# Patient Record
Sex: Female | Born: 1990 | Race: Black or African American | Hispanic: No | Marital: Single | Smoking: Never smoker
Health system: Southern US, Community
[De-identification: ages and names within clinical notes are randomized; demographics above are authoritative.]

## PROBLEM LIST (undated history)

## (undated) ENCOUNTER — Inpatient Hospital Stay (HOSPITAL_COMMUNITY): Payer: Self-pay

## (undated) DIAGNOSIS — R160 Hepatomegaly, not elsewhere classified: Secondary | ICD-10-CM

## (undated) DIAGNOSIS — Z789 Other specified health status: Secondary | ICD-10-CM

## (undated) HISTORY — PX: NO PAST SURGERIES: SHX2092

## (undated) HISTORY — DX: Hepatomegaly, not elsewhere classified: R16.0

## (undated) HISTORY — DX: Other specified health status: Z78.9

---
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
## 2012-01-01 ENCOUNTER — Ambulatory Visit: Payer: Medicaid Other

## 2012-01-03 ENCOUNTER — Ambulatory Visit (INDEPENDENT_AMBULATORY_CARE_PROVIDER_SITE_OTHER): Payer: Medicaid Other | Admitting: *Deleted

## 2012-01-03 DIAGNOSIS — Z309 Encounter for contraceptive management, unspecified: Secondary | ICD-10-CM

## 2012-01-03 MED ORDER — MEDROXYPROGESTERONE ACETATE 150 MG/ML IM SUSP
150.0000 mg | Freq: Once | INTRAMUSCULAR | Status: AC
  Administered 2012-01-03: 150 mg via INTRAMUSCULAR

## 2012-01-03 NOTE — Progress Notes (Signed)
Next Depo due Dec 11 thru Apr 03, 2012.

## 2013-04-10 NOTE — L&D Delivery Note (Signed)
Delivery Note At 12:23 PM a viable female was delivered via  (Presentation: LOA).  APGAR: 9,9; weight - pending.  Placenta status: Delivered Intact, spontaneous. Cord: 3 vessel with the following complications: None.  Cord pH: N/A.   Anesthesia:  Epidural Episiotomy:  None Lacerations:  Small 1st degree tears - periurethral and labial.  Suture Repair: Hemostatic and did not need repair. Est. Blood Loss (mL):  250 mL  Mother progressed quickly and delivered over intact perineum.  Placenta delivered quickly afterward with minimal bleeding. Mother stable and baby doing well.   Mom to postpartum.  Baby to Couplet care / Skin to Skin.  Everlene OtherJayce Cook DO Family Medicine PGY-3 Pager #: [phone]  I was present for the delivery and agree with above.  [place] Zylen Wenig, CNM 02/26/2014 4:09 PM

## 2013-07-18 ENCOUNTER — Inpatient Hospital Stay (HOSPITAL_COMMUNITY)
Admission: AD | Admit: 2013-07-18 | Discharge: 2013-07-18 | Disposition: A | Discharge status: Home / Self Care | Payer: Medicaid Other | Source: Ambulatory Visit | Attending: Family Medicine | Admitting: Family Medicine

## 2013-07-18 ENCOUNTER — Inpatient Hospital Stay (HOSPITAL_COMMUNITY): Payer: Medicaid Other

## 2013-07-18 ENCOUNTER — Encounter (HOSPITAL_COMMUNITY): Payer: Self-pay

## 2013-07-18 DIAGNOSIS — O209 Hemorrhage in early pregnancy, unspecified: Secondary | ICD-10-CM

## 2013-07-18 LAB — WET PREP, GENITAL
CLUE CELLS WET PREP: NONE SEEN
TRICH WET PREP: NONE SEEN
Yeast Wet Prep HPF POC: NONE SEEN

## 2013-07-18 LAB — ABO/RH: ABO/RH(D): A POS

## 2013-07-18 LAB — POCT PREGNANCY, URINE: PREG TEST UR: POSITIVE — AB

## 2013-07-18 LAB — CBC
HEMATOCRIT: 35.4 % — AB (ref 36.0–46.0)
Hemoglobin: 12.5 g/dL (ref 12.0–15.0)
MCH: 29.5 pg (ref 26.0–34.0)
MCHC: 35.3 g/dL (ref 30.0–36.0)
MCV: 83.5 fL (ref 78.0–100.0)
PLATELETS: 320 10*3/uL (ref 150–400)
RBC: 4.24 MIL/uL (ref 3.87–5.11)
RDW: 13.5 % (ref 11.5–15.5)
WBC: 11.4 10*3/uL — AB (ref 4.0–10.5)

## 2013-07-18 LAB — URINALYSIS, ROUTINE W REFLEX MICROSCOPIC
Bilirubin Urine: NEGATIVE
Glucose, UA: NEGATIVE mg/dL
Ketones, ur: NEGATIVE mg/dL
Leukocytes, UA: NEGATIVE
Nitrite: NEGATIVE
Protein, ur: NEGATIVE mg/dL
Specific Gravity, Urine: 1.02 (ref 1.005–1.030)
UROBILINOGEN UA: 0.2 mg/dL (ref 0.0–1.0)
pH: 7.5 (ref 5.0–8.0)

## 2013-07-18 LAB — URINE MICROSCOPIC-ADD ON

## 2013-07-18 LAB — HCG, QUANTITATIVE, PREGNANCY: hCG, Beta Chain, Quant, S: 58766 m[IU]/mL — ABNORMAL HIGH (ref <5)

## 2013-07-18 LAB — HIV ANTIBODY (ROUTINE TESTING W REFLEX): HIV: NONREACTIVE

## 2013-07-18 NOTE — Discharge Instructions (Signed)
Vaginal Bleeding During Pregnancy, First Trimester A small amount of bleeding (spotting) from the vagina is relatively common in early pregnancy. It usually stops on its own. Various things may cause bleeding or spotting in early pregnancy. Some bleeding may be related to the pregnancy, and some may not. In most cases, the bleeding is normal and is not a problem. However, bleeding can also be a sign of something serious. Be sure to tell your health care provider about any vaginal bleeding right away. Some possible causes of vaginal bleeding during the first trimester include:  Infection or inflammation of the cervix.  Growths (polyps) on the cervix.  Miscarriage or threatened miscarriage.  Pregnancy tissue has developed outside of the uterus and in a fallopian tube (tubal pregnancy).  Tiny cysts have developed in the uterus instead of pregnancy tissue (molar pregnancy). HOME CARE INSTRUCTIONS  Watch your condition for any changes. The following actions may help to lessen any discomfort you are feeling:  Follow your health care provider's instructions for limiting your activity. If your health care provider orders bed rest, you may need to stay in bed and only get up to use the bathroom. However, your health care provider may allow you to continue light activity.  If needed, make plans for someone to help with your regular activities and responsibilities while you are on bed rest.  Keep track of the number of pads you use each day, how often you change pads, and how soaked (saturated) they are. Write this down.  Do not use tampons. Do not douche.  Do not have sexual intercourse or orgasms until approved by your health care provider.  If you pass any tissue from your vagina, save the tissue so you can show it to your health care provider.  Only take over-the-counter or prescription medicines as directed by your health care provider.  Do not take aspirin because it can make you  bleed.  Keep all follow-up appointments as directed by your health care provider. SEEK MEDICAL CARE IF:  You have any vaginal bleeding during any part of your pregnancy.  You have cramps or labor pains. SEEK IMMEDIATE MEDICAL CARE IF:   You have severe cramps in your back or belly (abdomen).  You have a fever, not controlled by medicine.  You pass large clots or tissue from your vagina.  Your bleeding increases.  You feel lightheaded or weak, or you have fainting episodes.  You have chills.  You are leaking fluid or have a gush of fluid from your vagina.  You pass out while having a bowel movement. MAKE SURE YOU:  Understand these instructions.  Will watch your condition.  Will get help right away if you are not doing well or get worse. Document Released: 01/04/2005 Document Revised: 01/15/2013 Document Reviewed: 12/02/2012 [facility] Patient Information 2014 [place].  Pelvic Rest Pelvic rest is sometimes recommended for women when:   The placenta is partially or completely covering the opening of the cervix (placenta previa).  There is bleeding between the uterine wall and the amniotic sac in the first trimester (subchorionic hemorrhage).  The cervix begins to open without labor starting (incompetent cervix, cervical insufficiency).  The labor is too early (preterm labor). HOME CARE INSTRUCTIONS  Do not have sexual intercourse, stimulation, or an orgasm.  Do not use tampons, douche, or put anything in the vagina.  Do not lift anything over 10 pounds (4.5 kg).  Avoid strenuous activity or straining your pelvic muscles. SEEK MEDICAL CARE IF:  You have any vaginal bleeding during pregnancy. Treat this as a potential emergency. °· You have cramping pain felt low in the stomach (stronger than menstrual cramps). °· You notice vaginal discharge (watery, mucus, or bloody). °· You have a low, dull backache. °· There are regular contractions or uterine  tightening. °SEEK IMMEDIATE MEDICAL CARE IF: °You have vaginal bleeding and have placenta previa.  °Document Released: 07/22/2010 Document Revised: 06/19/2011 Document Reviewed: 07/22/2010 °ExitCare® Patient Information ©2014 [employer]. ° °

## 2013-07-18 NOTE — MAU Provider Note (Signed)
History     CSN: [id number]  Arrival date and time: 07/18/13 0414   First Provider Initiated Contact with Patient 07/18/13 0447      Chief Complaint  Patient presents with  . Vaginal Bleeding   HPI Comments: April [name] 22 y.o. G1P0 [redacted]w[redacted]d presents to MAU with blown bleeding from vagina that started tonight. She denies any pain. Last intercourse was several weeks ago.   Vaginal Bleeding      History reviewed. No pertinent past medical history.  History reviewed. No pertinent past surgical history.  Family History  Problem Relation Age of Onset  . Diabetes Maternal Aunt   . Diabetes Maternal Uncle   . Hypertension Maternal Uncle   . Diabetes Maternal Grandmother   . Hypertension Maternal Grandmother   . Cancer Maternal Grandfather     History  Substance Use Topics  . Smoking status: Never Smoker   . Smokeless tobacco: Not on file  . Alcohol Use: No    Allergies:  Allergies  Allergen Reactions  . Eggs Or Egg-Derived Products Hives and Swelling     Mother and patient state swelling of throat, itching of throat, hives.    Facility-administered medications prior to admission  Medication Dose Route Frequency Provider Last Rate Last Dose  . meningococcal polysaccharide (MENACTRA) injection 0.5 mL  0.5 mL Intramuscular Once Durwin Reges, MD       Prescriptions prior to admission  Medication Sig Dispense Refill  . Prenatal Vit-Fe Fumarate-FA (MULTIVITAMIN-PRENATAL) 27-0.8 MG TABS tablet Take 1 tablet by mouth daily at 12 noon.      . medroxyPROGESTERone (DEPO-PROVERA) 150 MG/ML injection Inject 1 mL (150 mg total) into the muscle every 3 (three) months.  1 mL  0    Review of Systems  Constitutional: Negative.   HENT: Negative.   Eyes: Negative.   Respiratory: Negative.   Cardiovascular: Negative.   Gastrointestinal: Negative.   Genitourinary: Positive for vaginal bleeding.       Vaginal bleeding  Musculoskeletal: Negative.   Skin: Negative.    Neurological: Negative.   Psychiatric/Behavioral: Negative.    Physical Exam   Blood pressure 123/61, pulse 98, temperature 98.8 F (37.1 C), temperature source Oral, resp. rate 18, height 5' (1.524 m), weight 166 lb 6.4 oz (75.479 kg), last menstrual period 05/21/2013.  Physical Exam  Constitutional: She is oriented to person, place, and time. She appears well-developed and well-nourished. No distress.  HENT:  Head: Normocephalic and atraumatic.  Eyes: Pupils are equal, round, and reactive to light.  GI: Soft. Bowel sounds are normal. She exhibits no distension and no mass. There is no tenderness. There is no rebound and no guarding.  Genitourinary:  Genital:External negative Vaginal:small amount brown blood Cervix:closed/ thick Bimanual:nonteneder   Musculoskeletal: Normal range of motion.  Neurological: She is alert and oriented to person, place, and time.  Skin: Skin is warm and dry.  Psychiatric: She has a normal mood and affect. Her behavior is normal. Judgment and thought content normal.   Results for orders placed during the hospital encounter of 07/18/13 (from the past 24 hour(s))  URINALYSIS, ROUTINE W REFLEX MICROSCOPIC     Status: Abnormal   Collection Time    07/18/13  4:22 AM      Result Value Ref Range   Color, Urine YELLOW  YELLOW   APPearance CLEAR  CLEAR   Specific Gravity, Urine 1.020  1.005 - 1.030   pH 7.5  5.0 - 8.0   Glucose, UA NEGATIVE  NEGATIVE mg/dL   Hgb urine dipstick MODERATE (*) NEGATIVE   Bilirubin Urine NEGATIVE  NEGATIVE   Ketones, ur NEGATIVE  NEGATIVE mg/dL   Protein, ur NEGATIVE  NEGATIVE mg/dL   Urobilinogen, UA 0.2  0.0 - 1.0 mg/dL   Nitrite NEGATIVE  NEGATIVE   Leukocytes, UA NEGATIVE  NEGATIVE  URINE MICROSCOPIC-ADD ON     Status: Abnormal   Collection Time    07/18/13  4:22 AM      Result Value Ref Range   Squamous Epithelial / LPF FEW (*) RARE   WBC, UA 0-2  <3 WBC/hpf   RBC / HPF 0-2  <3 RBC/hpf   Bacteria, UA FEW (*) RARE    Urine-Other MUCOUS PRESENT    POCT PREGNANCY, URINE     Status: Abnormal   Collection Time    07/18/13  4:29 AM      Result Value Ref Range   Preg Test, Ur POSITIVE (*) NEGATIVE  WET PREP, GENITAL     Status: Abnormal   Collection Time    07/18/13  5:09 AM      Result Value Ref Range   Yeast Wet Prep HPF POC NONE SEEN  NONE SEEN   Trich, Wet Prep NONE SEEN  NONE SEEN   Clue Cells Wet Prep HPF POC NONE SEEN  NONE SEEN   WBC, Wet Prep HPF POC FEW (*) NONE SEEN  ABO/RH     Status: None   Collection Time    07/18/13  5:17 AM      Result Value Ref Range   ABO/RH(D) A POS    CBC     Status: Abnormal   Collection Time    07/18/13  5:18 AM      Result Value Ref Range   WBC 11.4 (*) 4.0 - 10.5 K/uL   RBC 4.24  3.87 - 5.11 MIL/uL   Hemoglobin 12.5  12.0 - 15.0 g/dL   HCT 16.1 (*) 09.6 - 04.5 %   MCV 83.5  78.0 - 100.0 fL   MCH 29.5  26.0 - 34.0 pg   MCHC 35.3  30.0 - 36.0 g/dL   RDW 40.9  81.1 - 91.4 %   Platelets 320  150 - 400 K/uL  HCG, QUANTITATIVE, PREGNANCY     Status: Abnormal   Collection Time    07/18/13  5:18 AM      Result Value Ref Range   hCG, Beta Chain, Quant, [place] [address] (*) <5 mIU/mL   US Ob Comp Less 14 Wks  07/18/2013   CLINICAL DATA:  Pregnancy with bleeding.  EXAM: OBSTETRIC <14 WK [place] AND TRANSVAGINAL OB US  TECHNIQUE: Both transabdominal and transvaginal ultrasound examinations were performed for complete evaluation of the gestation as well as the maternal uterus, adnexal regions, and pelvic cul-de-sac. Transvaginal technique was performed to assess early pregnancy.  COMPARISON:  None.  FINDINGS: Intrauterine gestational sac: Visualized/normal in shape.  Yolk sac:  Present  Embryo:  Present  Cardiac Activity: Present  Heart Rate:  125 bpm  CRL:   14.1  mm   7 w 5 d                  [place] EDC: 03/01/2014  Maternal uterus/adnexae: The ovaries are not visualized. No adnexal mass. There is a small volume of simple appearing free pelvic fluid. A small amount of subchorionic  hemorrhage is noted, measuring no more than 2 mm in thickness and 7 mm in length.  IMPRESSION: 1.  Single, living intrauterine gestation, estimated age 18 weeks 5 days. 2. Small subchorionic hemorrhage.   Electronically Signed   By: Tiburcio PeaJonathan  Watts M.D.   On: 07/18/2013 05:57   [place] Ob Transvaginal  07/18/2013   CLINICAL DATA:  Pregnancy with bleeding.  EXAM: OBSTETRIC <14 WK US AND TRANSVAGINAL OB US  TECHNIQUE: Both transabdominal and transvaginal ultrasound examinations were performed for complete evaluation of the gestation as well as the maternal uterus, adnexal regions, and pelvic cul-de-sac. Transvaginal technique was performed to assess early pregnancy.  COMPARISON:  None.  FINDINGS: Intrauterine gestational sac: Visualized/normal in shape.  Yolk sac:  Present  Embryo:  Present  Cardiac Activity: Present  Heart Rate:  125 bpm  CRL:   14.1  mm   7 w 5 d                  [place] EDC: 03/01/2014  Maternal uterus/adnexae: The ovaries are not visualized. No adnexal mass. There is a small volume of simple appearing free pelvic fluid. A small amount of subchorionic hemorrhage is noted, measuring no more than 2 mm in thickness and 7 mm in length.  IMPRESSION: 1. Single, living intrauterine gestation, estimated age 18 weeks 5 days. 2. Small subchorionic hemorrhage.   Electronically Signed   By: Tiburcio PeaJonathan  Watts M.D.   On: 07/18/2013 05:57    MAU Course  Procedures  MDM  Wet prep, GC, Chlamydia, CBC, UA, U/S, ABORh, Quant   Assessment and Plan   A: [facility]  P: Bleeding Precautions Start PNV daily Begin Prenatal care Return to MAU if needed  Delbert PhenixLinda M Julion Gatt 07/18/2013, 5:03 AM

## 2013-07-18 NOTE — MAU Provider Note (Signed)
Attestation of Attending Supervision of Advanced Practitioner (PA/CNM/NP): Evaluation and management procedures were performed by the Advanced Practitioner under my supervision and collaboration.  I have reviewed the Advanced Practitioner's note and chart, and I agree with the management and plan.  Sundiata Ferrick S Emilija Bohman, MD [facility] Attending 07/18/2013 7:17 AM   

## 2013-07-18 NOTE — MAU Note (Signed)
Pt states she has a brown discharge when wiping. Denies abdominal pain or cramping. States LMP is 05-21-2013. [facility]

## 2013-07-19 LAB — GC/CHLAMYDIA PROBE AMP
CT Probe RNA: NEGATIVE
GC PROBE AMP APTIMA: NEGATIVE

## 2013-08-04 ENCOUNTER — Other Ambulatory Visit: Payer: Medicaid Other

## 2013-08-04 DIAGNOSIS — Z331 Pregnant state, incidental: Secondary | ICD-10-CM

## 2013-08-04 LAB — OB RESULTS CONSOLE GC/CHLAMYDIA
Chlamydia: NEGATIVE
Gonorrhea: NEGATIVE

## 2013-08-04 NOTE — Progress Notes (Signed)
NEW OB LABS DONE TODAY April [name] 

## 2013-08-05 LAB — OBSTETRIC PANEL
ANTIBODY SCREEN: NEGATIVE
BASOS ABS: 0.1 10*3/uL (ref 0.0–0.1)
BASOS PCT: 1 % (ref 0–1)
EOS ABS: 0.5 10*3/uL (ref 0.0–0.7)
Eosinophils Relative: 5 % (ref 0–5)
HCT: 37.4 % (ref 36.0–46.0)
HEMOGLOBIN: 13 g/dL (ref 12.0–15.0)
Hepatitis B Surface Ag: NEGATIVE
Lymphocytes Relative: 43 % (ref 12–46)
Lymphs Abs: 4.3 10*3/uL — ABNORMAL HIGH (ref 0.7–4.0)
MCH: 29 pg (ref 26.0–34.0)
MCHC: 34.8 g/dL (ref 30.0–36.0)
MCV: 83.3 fL (ref 78.0–100.0)
MONOS PCT: 8 % (ref 3–12)
Monocytes Absolute: 0.8 10*3/uL (ref 0.1–1.0)
NEUTROS PCT: 43 % (ref 43–77)
Neutro Abs: 4.3 10*3/uL (ref 1.7–7.7)
Platelets: 353 10*3/uL (ref 150–400)
RBC: 4.49 MIL/uL (ref 3.87–5.11)
RDW: 14.3 % (ref 11.5–15.5)
Rh Type: POSITIVE
Rubella: 4.53 Index — ABNORMAL HIGH (ref <0.90)
WBC: 9.9 10*3/uL (ref 4.0–10.5)

## 2013-08-05 LAB — SICKLE CELL SCREEN: SICKLE CELL SCREEN: NEGATIVE

## 2013-08-05 LAB — CULTURE, OB URINE
COLONY COUNT: NO GROWTH
ORGANISM ID, BACTERIA: NO GROWTH

## 2013-08-05 LAB — HIV ANTIBODY (ROUTINE TESTING W REFLEX): HIV 1&2 Ab, 4th Generation: NONREACTIVE

## 2013-08-11 ENCOUNTER — Other Ambulatory Visit (HOSPITAL_COMMUNITY)
Admission: RE | Admit: 2013-08-11 | Discharge: 2013-08-11 | Disposition: A | Discharge status: Home / Self Care | Payer: Medicaid Other | Source: Ambulatory Visit | Attending: Family Medicine | Admitting: Family Medicine

## 2013-08-11 ENCOUNTER — Encounter: Payer: Self-pay | Admitting: Family Medicine

## 2013-08-11 ENCOUNTER — Ambulatory Visit (INDEPENDENT_AMBULATORY_CARE_PROVIDER_SITE_OTHER): Payer: Medicaid Other | Admitting: Family Medicine

## 2013-08-11 VITALS — BP 118/64 | HR 97 | Temp 98.2°F | Wt 167.0 lb

## 2013-08-11 DIAGNOSIS — Z113 Encounter for screening for infections with a predominantly sexual mode of transmission: Secondary | ICD-10-CM | POA: Insufficient documentation

## 2013-08-11 DIAGNOSIS — Z3400 Encounter for supervision of normal first pregnancy, unspecified trimester: Secondary | ICD-10-CM | POA: Insufficient documentation

## 2013-08-11 MED ORDER — PRENATAL 27-0.8 MG PO TABS: 1.0000 | ORAL_TABLET | Freq: Every day | ORAL | Status: DC

## 2013-08-11 NOTE — Patient Instructions (Signed)
It was nice to see you today.  Follow up in 4 weeks.   Pregnancy - First Trimester During sexual intercourse, millions of sperm go into the vagina. Only 1 sperm will penetrate and fertilize the female egg while it is in the Fallopian tube. One week later, the fertilized egg implants into the wall of the uterus. An embryo begins to develop into a baby. At 6 to 8 weeks, the eyes and face are formed and the heartbeat can be seen on ultrasound. At the end of 12 weeks (first trimester), all the baby's organs are formed. Now that you are pregnant, you will want to do everything you can to have a healthy baby. Two of the most important things are to get good prenatal care and follow your caregiver's instructions. Prenatal care is all the medical care you receive before the baby's birth. It is given to prevent, find, and treat problems during the pregnancy and childbirth. PRENATAL EXAMS  During prenatal visits, your weight, blood pressure, and urine are checked. This is done to make sure you are healthy and progressing normally during the pregnancy.  A pregnant woman should gain 25 to 35 pounds during the pregnancy. However, if you are overweight or underweight, your caregiver will advise you regarding your weight.  Your caregiver will ask and answer questions for you.  Blood work, cervical cultures, other necessary tests, and a Pap test are done during your prenatal exams. These tests are done to check on your health and the probable health of your baby. Tests are strongly recommended and done for HIV with your permission. This is the virus that causes AIDS. These tests are done because medicines can be given to help prevent your baby from being born with this infection should you have been infected without knowing it. Blood work is also used to find out your blood type, previous infections, and follow your blood levels (hemoglobin).  Low hemoglobin (anemia) is common during pregnancy. Iron and vitamins are  given to help prevent this. Later in the pregnancy, blood tests for diabetes will be done along with any other tests if any problems develop.  You may need other tests to make sure you and the baby are doing well. CHANGES DURING THE FIRST TRIMESTER  Your body goes through many changes during pregnancy. They vary from person to person. Talk to your caregiver about changes you notice and are concerned about. Changes can include:  Your menstrual period stops.  The egg and sperm carry the genes that determine what you look like. Genes from you and your partner are forming a baby. The female genes determine whether the baby is a boy or a girl.  Your body increases in girth and you may feel bloated.  Feeling sick to your stomach (nauseous) and throwing up (vomiting). If the vomiting is uncontrollable, call your caregiver.  Your breasts will begin to enlarge and become tender.  Your nipples may stick out more and become darker.  The need to urinate more. Painful urination may mean you have a bladder infection.  Tiring easily.  Loss of appetite.  Cravings for certain kinds of food.  At first, you may gain or lose a couple of pounds.  You may have changes in your emotions from day to day (excited to be pregnant or concerned something may go wrong with the pregnancy and baby).  You may have more vivid and strange dreams. HOME CARE INSTRUCTIONS   It is very important to avoid all smoking, alcohol  and non-prescribed drugs during your pregnancy. These affect the formation and growth of the baby. Avoid chemicals while pregnant to ensure the delivery of a healthy infant.  Start your prenatal visits by the 12th week of pregnancy. They are usually scheduled monthly at first, then more often in the last 2 months before delivery. Keep your caregiver's appointments. Follow your caregiver's instructions regarding medicine use, blood and lab tests, exercise, and diet.  During pregnancy, you are  providing food for you and your baby. Eat regular, well-balanced meals. Choose foods such as meat, fish, milk and other low fat dairy products, vegetables, fruits, and whole-grain breads and cereals. Your caregiver will tell you of the ideal weight gain.  You can help morning sickness by keeping soda crackers at the bedside. Eat a couple before arising in the morning. You may want to use the crackers without salt on them.  Eating 4 to 5 small meals rather than 3 large meals a day also may help the nausea and vomiting.  Drinking liquids between meals instead of during meals also seems to help nausea and vomiting.  A physical sexual relationship may be continued throughout pregnancy if there are no other problems. Problems may be early (premature) leaking of amniotic fluid from the membranes, vaginal bleeding, or belly (abdominal) pain.  Exercise regularly if there are no restrictions. Check with your caregiver or physical therapist if you are unsure of the safety of some of your exercises. Greater weight gain will occur in the last 2 trimesters of pregnancy. Exercising will help:  Control your weight.  Keep you in shape.  Prepare you for labor and delivery.  Help you lose your pregnancy weight after you deliver your baby.  Wear a good support or jogging bra for breast tenderness during pregnancy. This may help if worn during sleep too.  Ask when prenatal classes are available. Begin classes when they are offered.  Do not use hot tubs, steam rooms, or saunas.  Wear your seat belt when driving. This protects you and your baby if you are in an accident.  Avoid raw meat, uncooked cheese, cat litter boxes, and soil used by cats throughout the pregnancy. These carry germs that can cause birth defects in the baby.  The first trimester is a good time to visit your dentist for your dental health. Getting your teeth cleaned is okay. Use a softer toothbrush and brush gently during pregnancy.  Ask  for help if you have financial, counseling, or nutritional needs during pregnancy. Your caregiver will be able to offer counseling for these needs as well as refer you for other special needs.  Do not take any medicines or herbs unless told by your caregiver.  Inform your caregiver if there is any mental or physical domestic violence.  Make a list of emergency phone numbers of family, friends, hospital, and police and fire departments.  Write down your questions. Take them to your prenatal visit.  Do not douche.  Do not cross your legs.  If you have to stand for long periods of time, rotate you feet or take small steps in a circle.  You may have more vaginal secretions that may require a sanitary pad. Do not use tampons or scented sanitary pads. MEDICINES AND DRUG USE IN PREGNANCY  Take prenatal vitamins as directed. The vitamin should contain 1 milligram of folic acid. Keep all vitamins out of reach of children. Only a couple vitamins or tablets containing iron may be fatal to a baby or  young child when ingested.  Avoid use of all medicines, including herbs, over-the-counter medicines, not prescribed or suggested by your caregiver. Only take over-the-counter or prescription medicines for pain, discomfort, or fever as directed by your caregiver. Do not use aspirin, ibuprofen, or naproxen unless directed by your caregiver.  Let your caregiver also know about herbs you may be using.  Alcohol is related to a number of birth defects. This includes fetal alcohol syndrome. All alcohol, in any form, should be avoided completely. Smoking will cause low birth rate and premature babies.  Street or illegal drugs are very harmful to the baby. They are absolutely forbidden. A baby born to an addicted mother will be addicted at birth. The baby will go through the same withdrawal an adult does.  Let your caregiver know about any medicines that you have to take and for what reason you take them. SEEK  MEDICAL CARE IF:  You have any concerns or worries during your pregnancy. It is better to call with your questions if you feel they cannot wait, rather than worry about them. SEEK IMMEDIATE MEDICAL CARE IF:   An unexplained oral temperature above 102 F (38.9 C) develops, or as your caregiver suggests.  You have leaking of fluid from the vagina (birth canal). If leaking membranes are suspected, take your temperature and inform your caregiver of this when you call.  There is vaginal spotting or bleeding. Notify your caregiver of the amount and how many pads are used.  You develop a bad smelling vaginal discharge with a change in the color.  You continue to feel sick to your stomach (nauseated) and have no relief from remedies suggested. You vomit blood or coffee ground-like materials.  You lose more than 2 pounds of weight in 1 week.  You gain more than 2 pounds of weight in 1 week and you notice swelling of your face, hands, feet, or legs.  You gain 5 pounds or more in 1 week (even if you do not have swelling of your hands, face, legs, or feet).  You get exposed to [place] measles and have never had them.  You are exposed to fifth disease or chickenpox.  You develop belly (abdominal) pain. Round ligament discomfort is a common non-cancerous (benign) cause of abdominal pain in pregnancy. Your caregiver still must evaluate this.  You develop headache, fever, diarrhea, pain with urination, or shortness of breath.  You fall or are in a car accident or have any kind of trauma.  There is mental or physical violence in your home. Document Released: 03/21/2001 Document Revised: 12/20/2011 Document Reviewed: 09/22/2008 [facility] Patient Information 2014 [place], [place].

## 2013-08-11 NOTE — Progress Notes (Signed)
Elanda Simeon CraftK Estrella is a 23 y.o. yo G1P0 at 4759w5d who presents for her initial prenatal visit. Pregnancy is not planned.  She reports that she is doing well.  No complaints today.  She is taking PNV. See flow sheet for details.  PMH, POBH, FH, meds, allergies and Social Hx reviewed.  Prenatal exam: Gen: Well nourished, well developed.  No distress.  Vitals noted. HEENT: Normocephalic, atraumatic.  Neck supple without cervical lymphadenopathy, thyromegaly or thyroid nodules. Good dentition. CV: RRR no murmur, gallops or rubs Lungs: CTA B.  Normal respiratory effort without wheezes or rales. Abd: soft, NTND. +BS.  Uterus not appreciated above pelvis. Ext: No LE edema.  Psych: Normal mood. Flat affect noted.   Assessment/plan: 1) Pregnancy 10659w5d doing well.  Current pregnancy issues include - Subchorionic hemorrhage earlier in pregnancy. Dating is reliable per patient.  Will obtain dating US to ensure accuracy. Prenatal labs reviewed. Bleeding and pain precautions reviewed. Importance of prenatal vitamins reviewed.  Genetic screening offered; patient undecided.  Early glucola is not indicated.    Follow up 4 weeks.

## 2013-08-12 LAB — URINE CYTOLOGY ANCILLARY ONLY
Chlamydia: NEGATIVE
Neisseria Gonorrhea: NEGATIVE

## 2013-09-18 ENCOUNTER — Ambulatory Visit (INDEPENDENT_AMBULATORY_CARE_PROVIDER_SITE_OTHER): Payer: Medicaid Other | Admitting: Family Medicine

## 2013-09-18 VITALS — BP 142/51 | HR 86 | Temp 99.5°F | Wt 162.0 lb

## 2013-09-18 DIAGNOSIS — Z3400 Encounter for supervision of normal first pregnancy, unspecified trimester: Secondary | ICD-10-CM

## 2013-09-18 DIAGNOSIS — Z3689 Encounter for other specified antenatal screening: Secondary | ICD-10-CM

## 2013-09-18 NOTE — Patient Instructions (Signed)
It was nice to see you today.  We have scheduled your [place].  Please continue taking you Prenatal daily.  If you have any questions or concerns please don't hesitate to call.  Follow up in 4 weeks.

## 2013-09-18 NOTE — Progress Notes (Signed)
April [name] is a 23 y.o. G1P0 at [redacted]w[redacted]d for routine follow up.   She reports some occasional nausea.  No complaints or other concerns.  Denies vaginal bleeding, LOF, contractions.  No fetal movement yet.  See flow sheet for details.  A/P: Pregnancy at [redacted]w[redacted]d.  Doing well.   Pregnancy issues include - Subchorionic hemorrhage noted on initial [place]. Anatomy ultrasound ordered to be scheduled at 18-19 weeks.  Bleeding and pain precautions reviewed. Follow up 4 weeks.

## 2013-10-02 ENCOUNTER — Ambulatory Visit (HOSPITAL_COMMUNITY)
Admission: RE | Admit: 2013-10-02 | Discharge: 2013-10-02 | Disposition: A | Discharge status: Home / Self Care | Payer: Medicaid Other | Source: Ambulatory Visit | Attending: Family Medicine | Admitting: Family Medicine

## 2013-10-02 DIAGNOSIS — Z3689 Encounter for other specified antenatal screening: Secondary | ICD-10-CM | POA: Insufficient documentation

## 2013-10-15 ENCOUNTER — Ambulatory Visit (INDEPENDENT_AMBULATORY_CARE_PROVIDER_SITE_OTHER): Payer: Medicaid Other | Admitting: Family Medicine

## 2013-10-15 ENCOUNTER — Other Ambulatory Visit (HOSPITAL_COMMUNITY): Payer: Self-pay | Admitting: Obstetrics

## 2013-10-15 VITALS — BP 109/69 | HR 75 | Temp 98.6°F | Wt 166.0 lb

## 2013-10-15 DIAGNOSIS — Z3402 Encounter for supervision of normal first pregnancy, second trimester: Secondary | ICD-10-CM

## 2013-10-15 DIAGNOSIS — Z1389 Encounter for screening for other disorder: Secondary | ICD-10-CM

## 2013-10-15 DIAGNOSIS — Z3400 Encounter for supervision of normal first pregnancy, unspecified trimester: Secondary | ICD-10-CM

## 2013-10-15 NOTE — Progress Notes (Signed)
April [name] is a 23 y.o. G1P0 at 2323w0d for routine follow up.  She reports that she is doing well. No current complaints. She denies vaginal bleeding, abdominal pain/contractions, LOF.  + Fetal movement.  See flow sheet for details.  A/P: Pregnancy at 7023w0d.  Doing well.   Pregnancy issues include - Prior subchorionic hemorrhage.  Anatomy scan reviewed - Limited views of face, CSP and heart. Repeat US scheduled. Preterm labor precautions reviewed. Follow up 4 weeks.

## 2013-11-06 ENCOUNTER — Ambulatory Visit (HOSPITAL_COMMUNITY)
Admission: RE | Admit: 2013-11-06 | Discharge: 2013-11-06 | Disposition: A | Discharge status: Home / Self Care | Payer: Medicaid Other | Source: Ambulatory Visit | Attending: Family Medicine | Admitting: Family Medicine

## 2013-11-06 DIAGNOSIS — Z1389 Encounter for screening for other disorder: Secondary | ICD-10-CM | POA: Insufficient documentation

## 2013-11-06 DIAGNOSIS — Z363 Encounter for antenatal screening for malformations: Secondary | ICD-10-CM | POA: Insufficient documentation

## 2013-11-12 ENCOUNTER — Ambulatory Visit (INDEPENDENT_AMBULATORY_CARE_PROVIDER_SITE_OTHER): Payer: Medicaid Other | Admitting: Family Medicine

## 2013-11-12 VITALS — BP 113/76 | HR 90 | Temp 98.3°F | Wt 166.6 lb

## 2013-11-12 DIAGNOSIS — Z3400 Encounter for supervision of normal first pregnancy, unspecified trimester: Secondary | ICD-10-CM

## 2013-11-12 DIAGNOSIS — Z3402 Encounter for supervision of normal first pregnancy, second trimester: Secondary | ICD-10-CM

## 2013-11-12 NOTE — Progress Notes (Signed)
April [name] is a 23 y.o. G1P0 at 2541w0d for routine follow up.  She reports that she is doing well. No vaginal bleeding, LOF, contractions.  Good FM. See flow sheet for details.  A/P: Pregnancy at 9241w0d.  Doing well.   Pregnancy issues include - Resolved subchorionic hemorrhage.  Childbirth and education classes were offered. Preterm labor precautions reviewed. Follow up 2-3 weeks.

## 2013-11-12 NOTE — Patient Instructions (Signed)
It was nice to see you today.  You're doing well.    Below is a link for childbirth classes: [email or web address]  [facility]

## 2013-12-04 ENCOUNTER — Ambulatory Visit (INDEPENDENT_AMBULATORY_CARE_PROVIDER_SITE_OTHER): Payer: Medicaid Other | Admitting: Family Medicine

## 2013-12-04 VITALS — BP 117/67 | HR 80 | Temp 98.1°F | Wt 170.5 lb

## 2013-12-04 DIAGNOSIS — Z3400 Encounter for supervision of normal first pregnancy, unspecified trimester: Secondary | ICD-10-CM

## 2013-12-04 DIAGNOSIS — Z3403 Encounter for supervision of normal first pregnancy, third trimester: Secondary | ICD-10-CM

## 2013-12-04 DIAGNOSIS — Z23 Encounter for immunization: Secondary | ICD-10-CM

## 2013-12-04 LAB — CBC
HEMATOCRIT: 35 % — AB (ref 36.0–46.0)
Hemoglobin: 12.2 g/dL (ref 12.0–15.0)
MCH: 29.5 pg (ref 26.0–34.0)
MCHC: 34.9 g/dL (ref 30.0–36.0)
MCV: 84.5 fL (ref 78.0–100.0)
Platelets: 337 10*3/uL (ref 150–400)
RBC: 4.14 MIL/uL (ref 3.87–5.11)
RDW: 14.2 % (ref 11.5–15.5)
WBC: 10.6 10*3/uL — ABNORMAL HIGH (ref 4.0–10.5)

## 2013-12-04 LAB — GLUCOSE, CAPILLARY
Comment 1: 1
Glucose-Capillary: 143 mg/dL — ABNORMAL HIGH (ref 70–99)

## 2013-12-04 NOTE — Patient Instructions (Addendum)
It was a pleasure meeting you today. Today we discussed how you've been sleeping, baby's movements, and some of your concerns about baby's arrival and your preparation for it.  - Please make sure to have Dr. Adriana Simas schedule you a repeat ultrasound at 32 weeks. - We have taken time to obtain blood tests during this visit. - Blood glucose testing was performed. - Please contact us with any additional concerns that you may have forgotten to bring up during this visit.    Prenatal Care  WHAT IS PRENATAL CARE?  Prenatal care means health care during your pregnancy, before your baby is born. It is very important to take care of yourself and your baby during your pregnancy by:   Getting early prenatal care. If you know you are pregnant, or think you might be pregnant, call your health care provider as soon as possible. Schedule a visit for a prenatal exam.  Getting regular prenatal care. Follow your health care provider's schedule for blood and other necessary tests. Do not miss appointments.  Doing everything you can to keep yourself and your baby healthy during your pregnancy.  Getting complete care. Prenatal care should include evaluation of the medical, dietary, educational, psychological, and social needs of you and your significant other. The medical and genetic history of your family and the family of your baby's father should be discussed with your health care provider.  Discussing with your health care provider:  Prescription, over-the-counter, and herbal medicines that you take.  Any history of substance abuse, alcohol use, smoking, and illegal drug use.  Any history of domestic abuse and violence.  Immunizations you have received.  Your nutrition and diet.  The amount of exercise you do.  Any environmental and occupational hazards to which you are exposed.  History of sexually transmitted infections for both you and your partner.  Previous pregnancies you have had. WHY IS  PRENATAL CARE SO IMPORTANT?  By regularly seeing your health care provider, you help ensure that problems can be identified early so that they can be treated as soon as possible. Other problems might be prevented. Many studies have shown that early and regular prenatal care is important for the health of mothers and their babies.  HOW CAN I TAKE CARE OF MYSELF WHILE I AM PREGNANT?  Here are ways to take care of yourself and your baby:   Start or continue taking your multivitamin with 400 micrograms (mcg) of folic acid every day.  Get early and regular prenatal care. It is very important to see a health care provider during your pregnancy. Your health care provider will check at each visit to make sure that you and your baby are healthy. If there are any problems, action can be taken right away to help you and your baby.  Eat a healthy diet that includes:  Fruits.  Vegetables.  Foods low in saturated fat.  Whole grains.  Calcium-rich foods, such as milk, yogurt, and hard cheeses.  Drink 6-8 glasses of liquids a day.  Unless your health care provider tells you not to, try to be physically active for 30 minutes, most days of the week. If you are pressed for time, you can get your activity in through 10-minute segments, three times a day.  Do not smoke, drink alcohol, or use drugs. These can cause long-term damage to your baby. Talk with your health care provider about steps to take to stop smoking. Talk with a member of your faith community, a Veterinary surgeon, a  trusted friend, or your health care provider if you are concerned about your alcohol or drug use.  Ask your health care provider before taking any medicine, even over-the-counter medicines. Some medicines are not safe to take during pregnancy.  Get plenty of rest and sleep.  Avoid hot tubs and saunas during pregnancy.  Do not have X-rays taken unless absolutely necessary and with the recommendation of your health care provider. A lead  shield can be placed on your abdomen to protect your baby when X-rays are taken in other parts of your body.  Do not empty the cat litter when you are pregnant. It may contain a parasite that causes an infection called toxoplasmosis, which can cause birth defects. Also, use gloves when working in garden areas used by cats.  Do not eat uncooked or undercooked meats or fish.  Do not eat soft, mold-ripened cheeses (Brie, Camembert, and chevre) or soft, blue-veined cheese (Danish blue and Roquefort).  Stay away from toxic chemicals like:  Insecticides.  Solvents (some cleaners or paint thinners).  Lead.  Mercury.  Sexual intercourse may continue until the end of the pregnancy, unless you have a medical problem or there is a problem with the pregnancy and your health care provider tells you not to.  Do not wear high-heel shoes, especially during the second half of the pregnancy. You can lose your balance and fall.  Do not take long trips, unless absolutely necessary. Be sure to see your health care provider before going on the trip.  Do not sit in one position for more than 2 hours when on a trip.  Take a copy of your medical records when going on a trip. Know where a hospital is located in the city you are visiting, in case of an emergency.  Most dangerous household products will have pregnancy warnings on their labels. Ask your health care provider about products if you are unsure.  Limit or eliminate your caffeine intake from coffee, tea, sodas, medicines, and chocolate.  Many women continue working through pregnancy. Staying active might help you stay healthier. If you have a question about the safety or the hours you work at your particular job, talk with your health care provider.  Get informed:  Read books.  Watch videos.  Go to childbirth classes for you and your significant other.  Talk with experienced moms.  Ask your health care provider about childbirth education  classes for you and your partner. Classes can help you and your partner prepare for the birth of your baby.  Ask about a baby doctor (pediatrician) and methods and pain medicine for labor, delivery, and possible cesarean delivery. HOW OFTEN SHOULD I SEE MY HEALTH CARE PROVIDER DURING PREGNANCY?  Your health care provider will give you a schedule for your prenatal visits. You will have visits more often as you get closer to the end of your pregnancy. An average pregnancy lasts about 40 weeks.  A typical schedule includes visiting your health care provider:   About once each month during your first 6 months of pregnancy.  Every 2 weeks during the next 2 months.  Weekly in the last month, until the delivery date. Your health care provider will probably want to see you more often if:  You are older than 35 years.  Your pregnancy is high risk because you have certain health problems or problems with the pregnancy, such as:  Diabetes.  High blood pressure.  The baby is not growing on schedule, according to the  dates of the pregnancy. Your health care provider will do special tests to make sure you and your baby are not having any serious problems. WHAT HAPPENS DURING PRENATAL VISITS?   At your first prenatal visit, your health care provider will do a physical exam and talk to you about your health history and the health history of your partner and your family. Your health care provider will be able to tell you what date to expect your baby to be born on.  Your first physical exam will include checks of your blood pressure, measurements of your height and weight, and an exam of your pelvic organs. Your health care provider will do a Pap test if you have not had one recently and will do cultures of your cervix to make sure there is no infection.  At each prenatal visit, there will be tests of your blood, urine, blood pressure, weight, and the progress of the baby will be checked.  At your  later prenatal visits, your health care provider will check how you are doing and how your baby is developing. You may have a number of tests done as your pregnancy progresses.  Ultrasound exams are often used to check on your baby's growth and health.  You may have more urine and blood tests, as well as special tests, if needed. These may include amniocentesis to examine fluid in the pregnancy sac, stress tests to check how the baby responds to contractions, or a biophysical profile to measure your baby's well-being. Your health care provider will explain the tests and why they are necessary.  You should be tested for high blood sugar (gestational diabetes) between the 24th and 28th weeks of your pregnancy.  You should discuss with your health care provider your plans to breastfeed or bottle-feed your baby.  Each visit is also a chance for you to learn about staying healthy during pregnancy and to ask questions. Document Released: 03/30/2003 Document Revised: 04/01/2013 Document Reviewed: 06/11/2013 [facility] Patient Information 2015 [place], [place]. This information is not intended to replace advice given to you by your health care provider. Make sure you discuss any questions you have with your health care provider.

## 2013-12-04 NOTE — Progress Notes (Signed)
April [name] is a 23 y.o. G1P0 at [redacted]w[redacted]d for routine follow up.  She report no significant concerns today.  See flow sheet for details.  A/P: Pregnancy at [redacted]w[redacted]d.  Doing well.  Note: Patient needs to be scheduled for a followup US at 32weeks.  Infant feeding choice: possibly breastfeeding Contraception choice (n/a at this time)  Tdapwas given today. 1 hour glucola, CBC, RPR, and HIV were done today.   Pregnancy medical home forms were done today and reviewed.   RH status was reviewed and pt does not need Rhogam.  Rhogam was not given today.   Childbirth and education classes were not offered at this time. Preterm labor precautions reviewed. Kick counts reviewed (adequate). Follow up 2 weeks.  [facility] ATTENDING  NOTE April Eniola,MD I  have seen and examined this patient, reviewed their chart. I have discussed this patient with the resident. I agree with the resident's findings, assessment and care plan.

## 2013-12-05 LAB — HIV ANTIBODY (ROUTINE TESTING W REFLEX): HIV 1&2 Ab, 4th Generation: NONREACTIVE

## 2013-12-05 LAB — RPR

## 2013-12-09 ENCOUNTER — Other Ambulatory Visit (INDEPENDENT_AMBULATORY_CARE_PROVIDER_SITE_OTHER): Payer: Medicaid Other

## 2013-12-09 DIAGNOSIS — Z3400 Encounter for supervision of normal first pregnancy, unspecified trimester: Secondary | ICD-10-CM

## 2013-12-09 DIAGNOSIS — Z331 Pregnant state, incidental: Secondary | ICD-10-CM

## 2013-12-09 LAB — GLUCOSE, CAPILLARY: Glucose-Capillary: 92 mg/dL (ref 70–99)

## 2013-12-09 NOTE — Progress Notes (Signed)
3 HR GTT DONE TODAY April [name] 

## 2013-12-10 ENCOUNTER — Other Ambulatory Visit: Payer: Medicaid Other

## 2013-12-10 LAB — GLUCOSE TOLERANCE, 3 HOURS
GLUCOSE 3 HOUR GTT: 126 mg/dL (ref 70–144)
GLUCOSE, 1 HOUR-GESTATIONAL: 160 mg/dL (ref 70–189)
Glucose Tolerance, 2 hour: 138 mg/dL (ref 70–164)
Glucose Tolerance, Fasting: 78 mg/dL (ref 70–104)

## 2013-12-24 ENCOUNTER — Encounter: Payer: Self-pay | Admitting: Family Medicine

## 2013-12-24 ENCOUNTER — Other Ambulatory Visit (HOSPITAL_COMMUNITY)
Admission: RE | Admit: 2013-12-24 | Discharge: 2013-12-24 | Disposition: A | Discharge status: Home / Self Care | Payer: Medicaid Other | Source: Ambulatory Visit | Attending: Family Medicine | Admitting: Family Medicine

## 2013-12-24 ENCOUNTER — Ambulatory Visit (INDEPENDENT_AMBULATORY_CARE_PROVIDER_SITE_OTHER): Payer: Medicaid Other | Admitting: Family Medicine

## 2013-12-24 VITALS — BP 120/53 | HR 93 | Temp 98.7°F | Wt 172.0 lb

## 2013-12-24 DIAGNOSIS — N898 Other specified noninflammatory disorders of vagina: Secondary | ICD-10-CM

## 2013-12-24 DIAGNOSIS — Z202 Contact with and (suspected) exposure to infections with a predominantly sexual mode of transmission: Secondary | ICD-10-CM

## 2013-12-24 DIAGNOSIS — B373 Candidiasis of vulva and vagina: Secondary | ICD-10-CM

## 2013-12-24 DIAGNOSIS — Z113 Encounter for screening for infections with a predominantly sexual mode of transmission: Secondary | ICD-10-CM | POA: Insufficient documentation

## 2013-12-24 DIAGNOSIS — B3731 Acute candidiasis of vulva and vagina: Secondary | ICD-10-CM | POA: Insufficient documentation

## 2013-12-24 LAB — POCT WET PREP (WET MOUNT)
CLUE CELLS WET PREP WHIFF POC: NEGATIVE
WBC, Wet Prep HPF POC: >20

## 2013-12-24 MED ORDER — FLUCONAZOLE 150 MG PO TABS: 150.0000 mg | ORAL_TABLET | Freq: Once | ORAL | Status: DC

## 2013-12-24 NOTE — Assessment & Plan Note (Signed)
Diflucan  po x1 Follow up in 1 week for routine prenatal

## 2013-12-24 NOTE — Progress Notes (Signed)
Patient ID: Lorelee New, female   DOB: 03-26-1991, 23 y.o.   MRN: [id number]   Subjective:  Vernella TOREE EDLING is a 23 y.o. female here for SDA for vaginal itching.  She has had these symptoms for about a week now. No history of yeaast infections or recent antibiotics. Vagisil helped but she's still having symptoms. No new sexual partners or vaginal discharge. No fever, abd pain. +GFM, no UC's, vaginal discharge or bleeding.   All other pertinent systems reviewed and are negative. Objective:  BP 120/53  Pulse 93  Temp(Src) 98.7 F (37.1 C) (Oral)  Wt 172 lb (78.019 kg)  LMP 05/21/2013  Gen:  23 y.o. female in NAD Pelvic: External genitalia within normal limits.  Vaginal mucosa pink, moist, normal rugae. Cervix closed without lesions; thick white discharge without bleeding noted on speculum exam.   Giovanna Proposito present throughout duration of exam.   Assessment:  Neveah K Huesman is a 23 y.o. female here for vulvovaginal candidiasis.   Plan:  Vulvovaginal candidiasis:  - Diflucan  po x1 - Return for routine OB in 1 week

## 2014-01-02 ENCOUNTER — Ambulatory Visit (INDEPENDENT_AMBULATORY_CARE_PROVIDER_SITE_OTHER): Payer: Medicaid Other | Admitting: Family Medicine

## 2014-01-02 VITALS — BP 119/68 | HR 87 | Wt 175.6 lb

## 2014-01-02 DIAGNOSIS — R9389 Abnormal findings on diagnostic imaging of other specified body structures: Secondary | ICD-10-CM

## 2014-01-02 DIAGNOSIS — O289 Unspecified abnormal findings on antenatal screening of mother: Secondary | ICD-10-CM

## 2014-01-02 DIAGNOSIS — O283 Abnormal ultrasonic finding on antenatal screening of mother: Secondary | ICD-10-CM

## 2014-01-02 NOTE — Patient Instructions (Addendum)
We have scheduled an ultrasound for you  You will get a call to come in to the office depending on the results  Follow-up in 2 weeks.  Third Trimester of Pregnancy The third trimester is from week 29 through week 42, months 7 through 9. The third trimester is a time when the fetus is growing rapidly. At the end of the ninth month, the fetus is about 20 inches in length and weighs 6-10 pounds.  BODY CHANGES Your body goes through many changes during pregnancy. The changes vary from woman to woman.   Your weight will continue to increase. You can expect to gain 25-35 pounds (11-16 kg) by the end of the pregnancy.  You may begin to get stretch marks on your hips, abdomen, and breasts.  You may urinate more often because the fetus is moving lower into your pelvis and pressing on your bladder.  You may develop or continue to have heartburn as a result of your pregnancy.  You may develop constipation because certain hormones are causing the muscles that push waste through your intestines to slow down.  You may develop hemorrhoids or swollen, bulging veins (varicose veins).  You may have pelvic pain because of the weight gain and pregnancy hormones relaxing your joints between the bones in your pelvis. Backaches may result from overexertion of the muscles supporting your posture.  You may have changes in your hair. These can include thickening of your hair, rapid growth, and changes in texture. Some women also have hair loss during or after pregnancy, or hair that feels dry or thin. Your hair will most likely return to normal after your baby is born.  Your breasts will continue to grow and be tender. A yellow discharge may leak from your breasts called colostrum.  Your belly button may stick out.  You may feel short of breath because of your expanding uterus.  You may notice the fetus "dropping," or moving lower in your abdomen.  You may have a bloody mucus discharge. This usually  occurs a few days to a week before labor begins.  Your cervix becomes thin and soft (effaced) near your due date. WHAT TO EXPECT AT YOUR PRENATAL EXAMS  You will have prenatal exams every 2 weeks until week 36. Then, you will have weekly prenatal exams. During a routine prenatal visit:  You will be weighed to make sure you and the fetus are growing normally.  Your blood pressure is taken.  Your abdomen will be measured to track your baby's growth.  The fetal heartbeat will be listened to.  Any test results from the previous visit will be discussed.  You may have a cervical check near your due date to see if you have effaced. At around 36 weeks, your caregiver will check your cervix. At the same time, your caregiver will also perform a test on the secretions of the vaginal tissue. This test is to determine if a type of bacteria, Group B streptococcus, is present. Your caregiver will explain this further. Your caregiver may ask you:  What your birth plan is.  How you are feeling.  If you are feeling the baby move.  If you have had any abnormal symptoms, such as leaking fluid, bleeding, severe headaches, or abdominal cramping.  If you have any questions. Other tests or screenings that may be performed during your third trimester include:  Blood tests that check for low iron levels (anemia).  Fetal testing to check the health, activity level, and  growth of the fetus. Testing is done if you have certain medical conditions or if there are problems during the pregnancy. FALSE LABOR You may feel small, irregular contractions that eventually go away. These are called Braxton Hicks contractions, or false labor. Contractions may last for hours, days, or even weeks before true labor sets in. If contractions come at regular intervals, intensify, or become painful, it is best to be seen by your caregiver.  SIGNS OF LABOR   Menstrual-like cramps.  Contractions that are 5 minutes apart or  less.  Contractions that start on the top of the uterus and spread down to the lower abdomen and back.  A sense of increased pelvic pressure or back pain.  A watery or bloody mucus discharge that comes from the vagina. If you have any of these signs before the 37th week of pregnancy, call your caregiver right away. You need to go to the hospital to get checked immediately. HOME CARE INSTRUCTIONS   Avoid all smoking, herbs, alcohol, and unprescribed drugs. These chemicals affect the formation and growth of the baby.  Follow your caregiver's instructions regarding medicine use. There are medicines that are either safe or unsafe to take during pregnancy.  Exercise only as directed by your caregiver. Experiencing uterine cramps is a good sign to stop exercising.  Continue to eat regular, healthy meals.  Wear a good support bra for breast tenderness.  Do not use hot tubs, steam rooms, or saunas.  Wear your seat belt at all times when driving.  Avoid raw meat, uncooked cheese, cat litter boxes, and soil used by cats. These carry germs that can cause birth defects in the baby.  Take your prenatal vitamins.  Try taking a stool softener (if your caregiver approves) if you develop constipation. Eat more high-fiber foods, such as fresh vegetables or fruit and whole grains. Drink plenty of fluids to keep your urine clear or pale yellow.  Take warm sitz baths to soothe any pain or discomfort caused by hemorrhoids. Use hemorrhoid cream if your caregiver approves.  If you develop varicose veins, wear support hose. Elevate your feet for 15 minutes, 3-4 times a day. Limit salt in your diet.  Avoid heavy lifting, wear low heal shoes, and practice good posture.  Rest a lot with your legs elevated if you have leg cramps or low back pain.  Visit your dentist if you have not gone during your pregnancy. Use a soft toothbrush to brush your teeth and be gentle when you floss.  A sexual relationship  may be continued unless your caregiver directs you otherwise.  Do not travel far distances unless it is absolutely necessary and only with the approval of your caregiver.  Take prenatal classes to understand, practice, and ask questions about the labor and delivery.  Make a trial run to the hospital.  Pack your hospital bag.  Prepare the baby's nursery.  Continue to go to all your prenatal visits as directed by your caregiver. SEEK MEDICAL CARE IF:  You are unsure if you are in labor or if your water has broken.  You have dizziness.  You have mild pelvic cramps, pelvic pressure, or nagging pain in your abdominal area.  You have persistent nausea, vomiting, or diarrhea.  You have a bad smelling vaginal discharge.  You have pain with urination. SEEK IMMEDIATE MEDICAL CARE IF:   You have a fever.  You are leaking fluid from your vagina.  You have spotting or bleeding from your vagina.  You  have severe abdominal cramping or pain.  You have rapid weight loss or gain.  You have shortness of breath with chest pain.  You notice sudden or extreme swelling of your face, hands, ankles, feet, or legs.  You have not felt your baby move in over an hour.  You have severe headaches that do not go away with medicine.  You have vision changes. Document Released: 03/21/2001 Document Revised: 04/01/2013 Document Reviewed: 05/28/2012 [facility] Patient Information 2015 [place], [place]. This information is not intended to replace advice given to you by your health care provider. Make sure you discuss any questions you have with your health care provider.

## 2014-01-03 NOTE — Progress Notes (Signed)
April [name] is a 23 yo G1P0 at [redacted]w[redacted]d that presents for routine OB follow-up. She reports no vaginal bleed, LOF, vaginal discharge, abdominal pain or contractions. She reports multiple fetal kicks per hour. Patient reports not knowing the reason for having her repeat ultrasound.  Please see flow sheet for details.  Assessment/Plan  Scheduled follow-up OB ultrasound for previous abnormal ultrasound showing concern for markers suggesting Trisomy 21, 18 or 13. Discussed results of ultrasound with patient.   Overall, doing well with no complaints or concerns (apart from recent information from ultrasound results)  Follow-up in two weeks  Reviewed preterm labor precautions

## 2014-01-05 ENCOUNTER — Ambulatory Visit (HOSPITAL_COMMUNITY)
Admission: RE | Admit: 2014-01-05 | Discharge: 2014-01-05 | Disposition: A | Discharge status: Home / Self Care | Payer: Medicaid Other | Source: Ambulatory Visit | Attending: Family Medicine | Admitting: Family Medicine

## 2014-01-05 DIAGNOSIS — O289 Unspecified abnormal findings on antenatal screening of mother: Secondary | ICD-10-CM | POA: Diagnosis not present

## 2014-01-05 DIAGNOSIS — O283 Abnormal ultrasonic finding on antenatal screening of mother: Secondary | ICD-10-CM

## 2014-01-05 DIAGNOSIS — Z3689 Encounter for other specified antenatal screening: Secondary | ICD-10-CM | POA: Diagnosis not present

## 2014-01-09 ENCOUNTER — Ambulatory Visit (INDEPENDENT_AMBULATORY_CARE_PROVIDER_SITE_OTHER): Payer: Medicaid Other | Admitting: Family Medicine

## 2014-01-09 ENCOUNTER — Encounter: Payer: Medicaid Other | Admitting: Family Medicine

## 2014-01-09 VITALS — BP 120/55 | HR 91 | Temp 98.3°F | Wt 175.3 lb

## 2014-01-09 DIAGNOSIS — Z3403 Encounter for supervision of normal first pregnancy, third trimester: Secondary | ICD-10-CM

## 2014-01-09 NOTE — Patient Instructions (Signed)
Follow up in 2 weeks.  Third Trimester of Pregnancy The third trimester is from week 29 through week 42, months 7 through 9. The third trimester is a time when the fetus is growing rapidly. At the end of the ninth month, the fetus is about 20 inches in length and weighs 6-10 pounds.  BODY CHANGES Your body goes through many changes during pregnancy. The changes vary from woman to woman.   Your weight will continue to increase. You can expect to gain 25-35 pounds (11-16 kg) by the end of the pregnancy.  You may begin to get stretch marks on your hips, abdomen, and breasts.  You may urinate more often because the fetus is moving lower into your pelvis and pressing on your bladder.  You may develop or continue to have heartburn as a result of your pregnancy.  You may develop constipation because certain hormones are causing the muscles that push waste through your intestines to slow down.  You may develop hemorrhoids or swollen, bulging veins (varicose veins).  You may have pelvic pain because of the weight gain and pregnancy hormones relaxing your joints between the bones in your pelvis. Backaches may result from overexertion of the muscles supporting your posture.  You may have changes in your hair. These can include thickening of your hair, rapid growth, and changes in texture. Some women also have hair loss during or after pregnancy, or hair that feels dry or thin. Your hair will most likely return to normal after your baby is born.  Your breasts will continue to grow and be tender. A yellow discharge may leak from your breasts called colostrum.  Your belly button may stick out.  You may feel short of breath because of your expanding uterus.  You may notice the fetus "dropping," or moving lower in your abdomen.  You may have a bloody mucus discharge. This usually occurs a few days to a week before labor begins.  Your cervix becomes thin and soft (effaced) near your due date. WHAT  TO EXPECT AT YOUR PRENATAL EXAMS  You will have prenatal exams every 2 weeks until week 36. Then, you will have weekly prenatal exams. During a routine prenatal visit:  You will be weighed to make sure you and the fetus are growing normally.  Your blood pressure is taken.  Your abdomen will be measured to track your baby's growth.  The fetal heartbeat will be listened to.  Any test results from the previous visit will be discussed.  You may have a cervical check near your due date to see if you have effaced. At around 36 weeks, your caregiver will check your cervix. At the same time, your caregiver will also perform a test on the secretions of the vaginal tissue. This test is to determine if a type of bacteria, Group B streptococcus, is present. Your caregiver will explain this further. Your caregiver may ask you:  What your birth plan is.  How you are feeling.  If you are feeling the baby move.  If you have had any abnormal symptoms, such as leaking fluid, bleeding, severe headaches, or abdominal cramping.  If you have any questions. Other tests or screenings that may be performed during your third trimester include:  Blood tests that check for low iron levels (anemia).  Fetal testing to check the health, activity level, and growth of the fetus. Testing is done if you have certain medical conditions or if there are problems during the pregnancy. FALSE LABOR You  may feel small, irregular contractions that eventually go away. These are called Braxton Hicks contractions, or false labor. Contractions may last for hours, days, or even weeks before true labor sets in. If contractions come at regular intervals, intensify, or become painful, it is best to be seen by your caregiver.  SIGNS OF LABOR   Menstrual-like cramps.  Contractions that are 5 minutes apart or less.  Contractions that start on the top of the uterus and spread down to the lower abdomen and back.  A sense of  increased pelvic pressure or back pain.  A watery or bloody mucus discharge that comes from the vagina. If you have any of these signs before the 37th week of pregnancy, call your caregiver right away. You need to go to the hospital to get checked immediately. HOME CARE INSTRUCTIONS   Avoid all smoking, herbs, alcohol, and unprescribed drugs. These chemicals affect the formation and growth of the baby.  Follow your caregiver's instructions regarding medicine use. There are medicines that are either safe or unsafe to take during pregnancy.  Exercise only as directed by your caregiver. Experiencing uterine cramps is a good sign to stop exercising.  Continue to eat regular, healthy meals.  Wear a good support bra for breast tenderness.  Do not use hot tubs, steam rooms, or saunas.  Wear your seat belt at all times when driving.  Avoid raw meat, uncooked cheese, cat litter boxes, and soil used by cats. These carry germs that can cause birth defects in the baby.  Take your prenatal vitamins.  Try taking a stool softener (if your caregiver approves) if you develop constipation. Eat more high-fiber foods, such as fresh vegetables or fruit and whole grains. Drink plenty of fluids to keep your urine clear or pale yellow.  Take warm sitz baths to soothe any pain or discomfort caused by hemorrhoids. Use hemorrhoid cream if your caregiver approves.  If you develop varicose veins, wear support hose. Elevate your feet for 15 minutes, 3-4 times a day. Limit salt in your diet.  Avoid heavy lifting, wear low heal shoes, and practice good posture.  Rest a lot with your legs elevated if you have leg cramps or low back pain.  Visit your dentist if you have not gone during your pregnancy. Use a soft toothbrush to brush your teeth and be gentle when you floss.  A sexual relationship may be continued unless your caregiver directs you otherwise.  Do not travel far distances unless it is absolutely  necessary and only with the approval of your caregiver.  Take prenatal classes to understand, practice, and ask questions about the labor and delivery.  Make a trial run to the hospital.  Pack your hospital bag.  Prepare the baby's nursery.  Continue to go to all your prenatal visits as directed by your caregiver. SEEK MEDICAL CARE IF:  You are unsure if you are in labor or if your water has broken.  You have dizziness.  You have mild pelvic cramps, pelvic pressure, or nagging pain in your abdominal area.  You have persistent nausea, vomiting, or diarrhea.  You have a bad smelling vaginal discharge.  You have pain with urination. SEEK IMMEDIATE MEDICAL CARE IF:   You have a fever.  You are leaking fluid from your vagina.  You have spotting or bleeding from your vagina.  You have severe abdominal cramping or pain.  You have rapid weight loss or gain.  You have shortness of breath with chest pain.  You notice sudden or extreme swelling of your face, hands, ankles, feet, or legs.  You have not felt your baby move in over an hour.  You have severe headaches that do not go away with medicine.  You have vision changes. Document Released: 03/21/2001 Document Revised: 04/01/2013 Document Reviewed: 05/28/2012 [facility] Patient Information 2015 [place]. This information is not intended to replace advice given to you by your health care provider. Make sure you discuss any questions you have with your health care provider.

## 2014-01-09 NOTE — Progress Notes (Signed)
April [name] is a 23 y.o. G1P0 at 7248w2d for routine follow up.   She reports that she is doing well. No contractions, loss of fluid, or vaginal bleeding. Good fetal movement.  See flow sheet for details.  A/P: Pregnancy at 4348w2d.  Doing well.   Pregnancy issues include - Echogenic LV focus; Prior pyelectasis (now resolved). Infant feeding choice - Breast. Contraception choice - Undecided.   Infant circumcision desired - Yes.   Preterm labor precautions reviewed. Kick counts reviewed. Follow up 2 weeks.

## 2014-01-27 ENCOUNTER — Ambulatory Visit (INDEPENDENT_AMBULATORY_CARE_PROVIDER_SITE_OTHER): Payer: Medicaid Other | Admitting: Family Medicine

## 2014-01-27 VITALS — BP 116/71 | HR 87 | Wt 176.0 lb

## 2014-01-27 DIAGNOSIS — Z3403 Encounter for supervision of normal first pregnancy, third trimester: Secondary | ICD-10-CM

## 2014-01-27 NOTE — Progress Notes (Signed)
April [name] is a 23 y.o. G1P0 at 5252w6d for routine follow up.   She reports that she is doing well.  Irregular contractions up to 10 times daily.  No bleeding or LOF. Good fetal movement.   See flow sheet for details.  A/P: Pregnancy at 4952w6d.  Doing well.   Pregnancy issues include Pregnancy issues include - Echogenic LV focus; Prior pyelectasis (now resolved).  Tdap given at prior visit 12/04/13.  GBS, GC/Chlamydia at next visit. Infant feeding choice - Breast.  Contraception choice - Undecided.  Infant circumcision desired - Yes.  Preterm labor precautions reviewed.  Kick counts reviewed.  Follow up 2 weeks.

## 2014-01-27 NOTE — Patient Instructions (Signed)
Follow up in 1 week.   Third Trimester of Pregnancy The third trimester is from week 29 through week 42, months 7 through 9. The third trimester is a time when the fetus is growing rapidly. At the end of the ninth month, the fetus is about 20 inches in length and weighs 6-10 pounds.  BODY CHANGES Your body goes through many changes during pregnancy. The changes vary from woman to woman.   Your weight will continue to increase. You can expect to gain 25-35 pounds (11-16 kg) by the end of the pregnancy.  You may begin to get stretch marks on your hips, abdomen, and breasts.  You may urinate more often because the fetus is moving lower into your pelvis and pressing on your bladder.  You may develop or continue to have heartburn as a result of your pregnancy.  You may develop constipation because certain hormones are causing the muscles that push waste through your intestines to slow down.  You may develop hemorrhoids or swollen, bulging veins (varicose veins).  You may have pelvic pain because of the weight gain and pregnancy hormones relaxing your joints between the bones in your pelvis. Backaches may result from overexertion of the muscles supporting your posture.  You may have changes in your hair. These can include thickening of your hair, rapid growth, and changes in texture. Some women also have hair loss during or after pregnancy, or hair that feels dry or thin. Your hair will most likely return to normal after your baby is born.  Your breasts will continue to grow and be tender. A yellow discharge may leak from your breasts called colostrum.  Your belly button may stick out.  You may feel short of breath because of your expanding uterus.  You may notice the fetus "dropping," or moving lower in your abdomen.  You may have a bloody mucus discharge. This usually occurs a few days to a week before labor begins.  Your cervix becomes thin and soft (effaced) near your due date. WHAT  TO EXPECT AT YOUR PRENATAL EXAMS  You will have prenatal exams every 2 weeks until week 36. Then, you will have weekly prenatal exams. During a routine prenatal visit:  You will be weighed to make sure you and the fetus are growing normally.  Your blood pressure is taken.  Your abdomen will be measured to track your baby's growth.  The fetal heartbeat will be listened to.  Any test results from the previous visit will be discussed.  You may have a cervical check near your due date to see if you have effaced. At around 36 weeks, your caregiver will check your cervix. At the same time, your caregiver will also perform a test on the secretions of the vaginal tissue. This test is to determine if a type of bacteria, Group B streptococcus, is present. Your caregiver will explain this further. Your caregiver may ask you:  What your birth plan is.  How you are feeling.  If you are feeling the baby move.  If you have had any abnormal symptoms, such as leaking fluid, bleeding, severe headaches, or abdominal cramping.  If you have any questions. Other tests or screenings that may be performed during your third trimester include:  Blood tests that check for low iron levels (anemia).  Fetal testing to check the health, activity level, and growth of the fetus. Testing is done if you have certain medical conditions or if there are problems during the pregnancy. FALSE LABOR   You may feel small, irregular contractions that eventually go away. These are called Braxton Hicks contractions, or false labor. Contractions may last for hours, days, or even weeks before true labor sets in. If contractions come at regular intervals, intensify, or become painful, it is best to be seen by your caregiver.  SIGNS OF LABOR   Menstrual-like cramps.  Contractions that are 5 minutes apart or less.  Contractions that start on the top of the uterus and spread down to the lower abdomen and back.  A sense of  increased pelvic pressure or back pain.  A watery or bloody mucus discharge that comes from the vagina. If you have any of these signs before the 37th week of pregnancy, call your caregiver right away. You need to go to the hospital to get checked immediately. HOME CARE INSTRUCTIONS   Avoid all smoking, herbs, alcohol, and unprescribed drugs. These chemicals affect the formation and growth of the baby.  Follow your caregiver's instructions regarding medicine use. There are medicines that are either safe or unsafe to take during pregnancy.  Exercise only as directed by your caregiver. Experiencing uterine cramps is a good sign to stop exercising.  Continue to eat regular, healthy meals.  Wear a good support bra for breast tenderness.  Do not use hot tubs, steam rooms, or saunas.  Wear your seat belt at all times when driving.  Avoid raw meat, uncooked cheese, cat litter boxes, and soil used by cats. These carry germs that can cause birth defects in the baby.  Take your prenatal vitamins.  Try taking a stool softener (if your caregiver approves) if you develop constipation. Eat more high-fiber foods, such as fresh vegetables or fruit and whole grains. Drink plenty of fluids to keep your urine clear or pale yellow.  Take warm sitz baths to soothe any pain or discomfort caused by hemorrhoids. Use hemorrhoid cream if your caregiver approves.  If you develop varicose veins, wear support hose. Elevate your feet for 15 minutes, 3-4 times a day. Limit salt in your diet.  Avoid heavy lifting, wear low heal shoes, and practice good posture.  Rest a lot with your legs elevated if you have leg cramps or low back pain.  Visit your dentist if you have not gone during your pregnancy. Use a soft toothbrush to brush your teeth and be gentle when you floss.  A sexual relationship may be continued unless your caregiver directs you otherwise.  Do not travel far distances unless it is absolutely  necessary and only with the approval of your caregiver.  Take prenatal classes to understand, practice, and ask questions about the labor and delivery.  Make a trial run to the hospital.  Pack your hospital bag.  Prepare the baby's nursery.  Continue to go to all your prenatal visits as directed by your caregiver. SEEK MEDICAL CARE IF:  You are unsure if you are in labor or if your water has broken.  You have dizziness.  You have mild pelvic cramps, pelvic pressure, or nagging pain in your abdominal area.  You have persistent nausea, vomiting, or diarrhea.  You have a bad smelling vaginal discharge.  You have pain with urination. SEEK IMMEDIATE MEDICAL CARE IF:   You have a fever.  You are leaking fluid from your vagina.  You have spotting or bleeding from your vagina.  You have severe abdominal cramping or pain.  You have rapid weight loss or gain.  You have shortness of breath with chest pain.  You notice sudden or extreme swelling of your face, hands, ankles, feet, or legs.  You have not felt your baby move in over an hour.  You have severe headaches that do not go away with medicine.  You have vision changes. Document Released: 03/21/2001 Document Revised: 04/01/2013 Document Reviewed: 05/28/2012 [facility] Patient Information 2015 [place], [place]. This information is not intended to replace advice given to you by your health care provider. Make sure you discuss any questions you have with your health care provider.

## 2014-02-09 ENCOUNTER — Other Ambulatory Visit (HOSPITAL_COMMUNITY)
Admission: RE | Admit: 2014-02-09 | Discharge: 2014-02-09 | Disposition: A | Discharge status: Home / Self Care | Payer: Medicaid Other | Source: Ambulatory Visit | Attending: Family Medicine | Admitting: Family Medicine

## 2014-02-09 ENCOUNTER — Ambulatory Visit (INDEPENDENT_AMBULATORY_CARE_PROVIDER_SITE_OTHER): Payer: Medicaid Other | Admitting: Family Medicine

## 2014-02-09 ENCOUNTER — Encounter: Payer: Self-pay | Admitting: Family Medicine

## 2014-02-09 VITALS — BP 135/77 | HR 95 | Temp 98.3°F | Wt 180.1 lb

## 2014-02-09 DIAGNOSIS — Z113 Encounter for screening for infections with a predominantly sexual mode of transmission: Secondary | ICD-10-CM | POA: Insufficient documentation

## 2014-02-09 DIAGNOSIS — Z3403 Encounter for supervision of normal first pregnancy, third trimester: Secondary | ICD-10-CM

## 2014-02-09 NOTE — Progress Notes (Signed)
April [name] is a 23 y.o. G1P0 at 5637 5/7 who presents for routine follow up.  She reports that she is doing well.  Irregular contractions up to 10 times daily (last up to 30 secs). No bleeding or LOF. Good fetal movement.   See flow sheet for details.   A/P: Pregnancy at 37 5/7. Doing well.  Pregnancy issues include Pregnancy issues include - Echogenic LV focus; Prior pyelectasis (now resolved).  Tdap given at prior visit 12/04/13. GBS, GC/Chlamydia today. Infant feeding choice - Breast.  Contraception choice - Undecided.  [facility] Preterm labor precautions reviewed.  Kick counts reviewed.  Follow up 2 weeks.

## 2014-02-09 NOTE — Patient Instructions (Signed)
Follow up in 1 week.   Third Trimester of Pregnancy The third trimester is from week 29 through week 42, months 7 through 9. The third trimester is a time when the fetus is growing rapidly. At the end of the ninth month, the fetus is about 20 inches in length and weighs 6-10 pounds.  BODY CHANGES Your body goes through many changes during pregnancy. The changes vary from woman to woman.   Your weight will continue to increase. You can expect to gain 25-35 pounds (11-16 kg) by the end of the pregnancy.  You may begin to get stretch marks on your hips, abdomen, and breasts.  You may urinate more often because the fetus is moving lower into your pelvis and pressing on your bladder.  You may develop or continue to have heartburn as a result of your pregnancy.  You may develop constipation because certain hormones are causing the muscles that push waste through your intestines to slow down.  You may develop hemorrhoids or swollen, bulging veins (varicose veins).  You may have pelvic pain because of the weight gain and pregnancy hormones relaxing your joints between the bones in your pelvis. Backaches may result from overexertion of the muscles supporting your posture.  You may have changes in your hair. These can include thickening of your hair, rapid growth, and changes in texture. Some women also have hair loss during or after pregnancy, or hair that feels dry or thin. Your hair will most likely return to normal after your baby is born.  Your breasts will continue to grow and be tender. A yellow discharge may leak from your breasts called colostrum.  Your belly button may stick out.  You may feel short of breath because of your expanding uterus.  You may notice the fetus "dropping," or moving lower in your abdomen.  You may have a bloody mucus discharge. This usually occurs a few days to a week before labor begins.  Your cervix becomes thin and soft (effaced) near your due date. WHAT  TO EXPECT AT YOUR PRENATAL EXAMS  You will have prenatal exams every 2 weeks until week 36. Then, you will have weekly prenatal exams. During a routine prenatal visit:  You will be weighed to make sure you and the fetus are growing normally.  Your blood pressure is taken.  Your abdomen will be measured to track your baby's growth.  The fetal heartbeat will be listened to.  Any test results from the previous visit will be discussed.  You may have a cervical check near your due date to see if you have effaced. At around 36 weeks, your caregiver will check your cervix. At the same time, your caregiver will also perform a test on the secretions of the vaginal tissue. This test is to determine if a type of bacteria, Group B streptococcus, is present. Your caregiver will explain this further. Your caregiver may ask you:  What your birth plan is.  How you are feeling.  If you are feeling the baby move.  If you have had any abnormal symptoms, such as leaking fluid, bleeding, severe headaches, or abdominal cramping.  If you have any questions. Other tests or screenings that may be performed during your third trimester include:  Blood tests that check for low iron levels (anemia).  Fetal testing to check the health, activity level, and growth of the fetus. Testing is done if you have certain medical conditions or if there are problems during the pregnancy. FALSE LABOR  You may feel small, irregular contractions that eventually go away. These are called Braxton Hicks contractions, or false labor. Contractions may last for hours, days, or even weeks before true labor sets in. If contractions come at regular intervals, intensify, or become painful, it is best to be seen by your caregiver.  SIGNS OF LABOR   Menstrual-like cramps.  Contractions that are 5 minutes apart or less.  Contractions that start on the top of the uterus and spread down to the lower abdomen and back.  A sense of  increased pelvic pressure or back pain.  A watery or bloody mucus discharge that comes from the vagina. If you have any of these signs before the 37th week of pregnancy, call your caregiver right away. You need to go to the hospital to get checked immediately. HOME CARE INSTRUCTIONS   Avoid all smoking, herbs, alcohol, and unprescribed drugs. These chemicals affect the formation and growth of the baby.  Follow your caregiver's instructions regarding medicine use. There are medicines that are either safe or unsafe to take during pregnancy.  Exercise only as directed by your caregiver. Experiencing uterine cramps is a good sign to stop exercising.  Continue to eat regular, healthy meals.  Wear a good support bra for breast tenderness.  Do not use hot tubs, steam rooms, or saunas.  Wear your seat belt at all times when driving.  Avoid raw meat, uncooked cheese, cat litter boxes, and soil used by cats. These carry germs that can cause birth defects in the baby.  Take your prenatal vitamins.  Try taking a stool softener (if your caregiver approves) if you develop constipation. Eat more high-fiber foods, such as fresh vegetables or fruit and whole grains. Drink plenty of fluids to keep your urine clear or pale yellow.  Take warm sitz baths to soothe any pain or discomfort caused by hemorrhoids. Use hemorrhoid cream if your caregiver approves.  If you develop varicose veins, wear support hose. Elevate your feet for 15 minutes, 3-4 times a day. Limit salt in your diet.  Avoid heavy lifting, wear low heal shoes, and practice good posture.  Rest a lot with your legs elevated if you have leg cramps or low back pain.  Visit your dentist if you have not gone during your pregnancy. Use a soft toothbrush to brush your teeth and be gentle when you floss.  A sexual relationship may be continued unless your caregiver directs you otherwise.  Do not travel far distances unless it is absolutely  necessary and only with the approval of your caregiver.  Take prenatal classes to understand, practice, and ask questions about the labor and delivery.  Make a trial run to the hospital.  Pack your hospital bag.  Prepare the baby's nursery.  Continue to go to all your prenatal visits as directed by your caregiver. SEEK MEDICAL CARE IF:  You are unsure if you are in labor or if your water has broken.  You have dizziness.  You have mild pelvic cramps, pelvic pressure, or nagging pain in your abdominal area.  You have persistent nausea, vomiting, or diarrhea.  You have a bad smelling vaginal discharge.  You have pain with urination. SEEK IMMEDIATE MEDICAL CARE IF:   You have a fever.  You are leaking fluid from your vagina.  You have spotting or bleeding from your vagina.  You have severe abdominal cramping or pain.  You have rapid weight loss or gain.  You have shortness of breath with chest pain.  You notice sudden or extreme swelling of your face, hands, ankles, feet, or legs.  You have not felt your baby move in over an hour.  You have severe headaches that do not go away with medicine.  You have vision changes. Document Released: 03/21/2001 Document Revised: 04/01/2013 Document Reviewed: 05/28/2012 [facility] Patient Information 2015 [place], [place]. This information is not intended to replace advice given to you by your health care provider. Make sure you discuss any questions you have with your health care provider.

## 2014-02-10 LAB — URINE CYTOLOGY ANCILLARY ONLY
CHLAMYDIA, DNA PROBE: NEGATIVE
Neisseria Gonorrhea: NEGATIVE

## 2014-02-11 LAB — STREP B DNA PROBE: GBSP: DETECTED

## 2014-02-16 ENCOUNTER — Ambulatory Visit (INDEPENDENT_AMBULATORY_CARE_PROVIDER_SITE_OTHER): Payer: Medicaid Other | Admitting: Family Medicine

## 2014-02-16 VITALS — BP 128/77 | HR 91 | Temp 98.3°F | Wt 180.9 lb

## 2014-02-16 DIAGNOSIS — Z3403 Encounter for supervision of normal first pregnancy, third trimester: Secondary | ICD-10-CM

## 2014-02-16 NOTE — Patient Instructions (Signed)
Follow up in 1 week.  Labor precautions: Go to women's if - Contractions are regular, every 5 mins for 1 hour; Vaginal bleeding; Decreased fetal movement; Loss of fluid/water breaks.  Take care  Dr. Adriana Simasook

## 2014-02-16 NOTE — Progress Notes (Signed)
April [name] is a 23 y.o. G1P0 at 4990w5d for routine follow up.   April [name] reports that April [name] is having contractions (irregular, sporadic).  No bleeding, LOF.  Good fetal movement.  See flow sheet for details.  A/P: Pregnancy at 2290w5d.  Doing well.   Pregnancy issues include - Echogenic LV focus; Prior pyelectasis (now resolved).  Infant feeding choice - Breast.  Contraception choice - Undecided.  [facility]. GBS/GC/CZ results were reviewed today.  GBS +.  Labor precautions reviewed. Kick counts reviewed.

## 2014-02-25 ENCOUNTER — Ambulatory Visit (INDEPENDENT_AMBULATORY_CARE_PROVIDER_SITE_OTHER): Payer: Medicaid Other | Admitting: Family Medicine

## 2014-02-25 VITALS — BP 119/78 | HR 84 | Temp 98.5°F | Wt 181.0 lb

## 2014-02-25 DIAGNOSIS — Z3403 Encounter for supervision of normal first pregnancy, third trimester: Secondary | ICD-10-CM

## 2014-02-25 NOTE — Progress Notes (Signed)
April [name] is a 23 y.o. G1P0 at 4224w0d for routine follow up.  She reports that she is having contractions (approx every 5-10 mins; mildly painful). No bleeding, LOF. Good fetal movement.  See flow sheet for details.  A/P: Pregnancy at 324w0d. Doing well.  Pregnancy issues include - Echogenic LV focus; Prior pyelectasis (now resolved).  Infant feeding choice - Breast.  Contraception choice - Undecided.  [facility]. Labor precautions reviewed. Setting up twice weekly testing; Will go ahead and schedule induction.

## 2014-02-26 ENCOUNTER — Inpatient Hospital Stay (HOSPITAL_COMMUNITY): Payer: Medicaid Other | Admitting: Anesthesiology

## 2014-02-26 ENCOUNTER — Inpatient Hospital Stay (HOSPITAL_COMMUNITY)
Admission: AD | Admit: 2014-02-26 | Discharge: 2014-02-28 | DRG: 775 | Disposition: A | Discharge status: Home / Self Care | Payer: Medicaid Other | Source: Ambulatory Visit | Attending: Obstetrics & Gynecology | Admitting: Obstetrics & Gynecology

## 2014-02-26 ENCOUNTER — Encounter (HOSPITAL_COMMUNITY): Payer: Self-pay | Admitting: *Deleted

## 2014-02-26 DIAGNOSIS — IMO0001 Reserved for inherently not codable concepts without codable children: Secondary | ICD-10-CM

## 2014-02-26 DIAGNOSIS — Z833 Family history of diabetes mellitus: Secondary | ICD-10-CM | POA: Diagnosis not present

## 2014-02-26 DIAGNOSIS — Z8249 Family history of ischemic heart disease and other diseases of the circulatory system: Secondary | ICD-10-CM

## 2014-02-26 DIAGNOSIS — O471 False labor at or after 37 completed weeks of gestation: Secondary | ICD-10-CM | POA: Diagnosis present

## 2014-02-26 DIAGNOSIS — Z3A40 40 weeks gestation of pregnancy: Secondary | ICD-10-CM | POA: Diagnosis present

## 2014-02-26 DIAGNOSIS — O99824 Streptococcus B carrier state complicating childbirth: Secondary | ICD-10-CM | POA: Diagnosis present

## 2014-02-26 LAB — TYPE AND SCREEN
ABO/RH(D): A POS
Antibody Screen: NEGATIVE

## 2014-02-26 LAB — CBC
HEMATOCRIT: 36.9 % (ref 36.0–46.0)
Hemoglobin: 12.8 g/dL (ref 12.0–15.0)
MCH: 30.3 pg (ref 26.0–34.0)
MCHC: 34.7 g/dL (ref 30.0–36.0)
MCV: 87.2 fL (ref 78.0–100.0)
Platelets: 283 10*3/uL (ref 150–400)
RBC: 4.23 MIL/uL (ref 3.87–5.11)
RDW: 13.9 % (ref 11.5–15.5)
WBC: 9.9 10*3/uL (ref 4.0–10.5)

## 2014-02-26 LAB — POCT FERN TEST: POCT Fern Test: POSITIVE

## 2014-02-26 LAB — RPR

## 2014-02-26 MED ORDER — DIBUCAINE 1 % RE OINT
1.0000 "application " | TOPICAL_OINTMENT | RECTAL | Status: DC | PRN
  Filled 2014-02-26: qty 28

## 2014-02-26 MED ORDER — DIPHENHYDRAMINE HCL 50 MG/ML IJ SOLN: 12.5000 mg | INTRAMUSCULAR | Status: DC | PRN

## 2014-02-26 MED ORDER — LIDOCAINE HCL (PF) 1 % IJ SOLN
30.0000 mL | INTRAMUSCULAR | Status: DC | PRN
  Filled 2014-02-26: qty 30

## 2014-02-26 MED ORDER — FENTANYL CITRATE 0.05 MG/ML IJ SOLN
50.0000 ug | INTRAMUSCULAR | Status: DC | PRN
  Administered 2014-02-26: 100 ug via INTRAVENOUS
  Filled 2014-02-26: qty 2

## 2014-02-26 MED ORDER — PENICILLIN G POTASSIUM 5000000 UNITS IJ SOLR
2.5000 10*6.[IU] | INTRAVENOUS | Status: DC
  Filled 2014-02-26 (×2): qty 2.5

## 2014-02-26 MED ORDER — OXYCODONE-ACETAMINOPHEN 5-325 MG PO TABS
1.0000 | ORAL_TABLET | ORAL | Status: DC | PRN
  Administered 2014-02-28: 1 via ORAL
  Filled 2014-02-26: qty 1

## 2014-02-26 MED ORDER — WITCH HAZEL-GLYCERIN EX PADS: 1.0000 "application " | MEDICATED_PAD | CUTANEOUS | Status: DC | PRN

## 2014-02-26 MED ORDER — PHENYLEPHRINE 40 MCG/ML (10ML) SYRINGE FOR IV PUSH (FOR BLOOD PRESSURE SUPPORT): 80.0000 ug | PREFILLED_SYRINGE | INTRAVENOUS | Status: DC | PRN

## 2014-02-26 MED ORDER — ONDANSETRON HCL 4 MG/2ML IJ SOLN: 4.0000 mg | INTRAMUSCULAR | Status: DC | PRN

## 2014-02-26 MED ORDER — TETANUS-DIPHTH-ACELL PERTUSSIS 5-2.5-18.5 LF-MCG/0.5 IM SUSP
0.5000 mL | Freq: Once | INTRAMUSCULAR | Status: DC
  Filled 2014-02-26: qty 0.5

## 2014-02-26 MED ORDER — LACTATED RINGERS IV SOLN: 500.0000 mL | INTRAVENOUS | Status: DC | PRN

## 2014-02-26 MED ORDER — FLEET ENEMA 7-19 GM/118ML RE ENEM: 1.0000 | ENEMA | RECTAL | Status: DC | PRN

## 2014-02-26 MED ORDER — SIMETHICONE 80 MG PO CHEW: 80.0000 mg | CHEWABLE_TABLET | ORAL | Status: DC | PRN

## 2014-02-26 MED ORDER — OXYTOCIN BOLUS FROM INFUSION
500.0000 mL | INTRAVENOUS | Status: DC
  Administered 2014-02-26: 500 mL via INTRAVENOUS

## 2014-02-26 MED ORDER — ZOLPIDEM TARTRATE 5 MG PO TABS: 5.0000 mg | ORAL_TABLET | Freq: Every evening | ORAL | Status: DC | PRN

## 2014-02-26 MED ORDER — PRENATAL MULTIVITAMIN CH
1.0000 | ORAL_TABLET | Freq: Every day | ORAL | Status: DC
  Administered 2014-02-27 – 2014-02-28 (×2): 1 via ORAL
  Filled 2014-02-26 (×2): qty 1

## 2014-02-26 MED ORDER — OXYTOCIN 40 UNITS IN LACTATED RINGERS INFUSION - SIMPLE MED
62.5000 mL/h | INTRAVENOUS | Status: DC
  Administered 2014-02-26: 62.5 mL/h via INTRAVENOUS
  Filled 2014-02-26: qty 1000

## 2014-02-26 MED ORDER — ONDANSETRON HCL 4 MG/2ML IJ SOLN: 4.0000 mg | Freq: Four times a day (QID) | INTRAMUSCULAR | Status: DC | PRN

## 2014-02-26 MED ORDER — EPHEDRINE 5 MG/ML INJ: 10.0000 mg | INTRAVENOUS | Status: DC | PRN

## 2014-02-26 MED ORDER — OXYCODONE-ACETAMINOPHEN 5-325 MG PO TABS: 1.0000 | ORAL_TABLET | ORAL | Status: DC | PRN

## 2014-02-26 MED ORDER — LACTATED RINGERS IV SOLN: 500.0000 mL | Freq: Once | INTRAVENOUS | Status: DC

## 2014-02-26 MED ORDER — OXYCODONE-ACETAMINOPHEN 5-325 MG PO TABS: 2.0000 | ORAL_TABLET | ORAL | Status: DC | PRN

## 2014-02-26 MED ORDER — LANOLIN HYDROUS EX OINT: TOPICAL_OINTMENT | CUTANEOUS | Status: DC | PRN

## 2014-02-26 MED ORDER — LACTATED RINGERS IV SOLN
INTRAVENOUS | Status: DC
  Administered 2014-02-26: 09:00:00 via INTRAVENOUS

## 2014-02-26 MED ORDER — IBUPROFEN 600 MG PO TABS
600.0000 mg | ORAL_TABLET | Freq: Four times a day (QID) | ORAL | Status: DC
  Administered 2014-02-26 – 2014-02-28 (×9): 600 mg via ORAL
  Filled 2014-02-26 (×9): qty 1

## 2014-02-26 MED ORDER — CITRIC ACID-SODIUM CITRATE 334-500 MG/5ML PO SOLN
30.0000 mL | ORAL | Status: DC | PRN
Start: 2014-02-26 — End: 2014-02-26

## 2014-02-26 MED ORDER — DEXTROSE 5 % IV SOLN
5.0000 10*6.[IU] | Freq: Once | INTRAVENOUS | Status: AC
  Administered 2014-02-26: 5 10*6.[IU] via INTRAVENOUS
  Filled 2014-02-26: qty 5

## 2014-02-26 MED ORDER — LIDOCAINE HCL (PF) 1 % IJ SOLN
INTRAMUSCULAR | Status: DC | PRN
  Administered 2014-02-26: 4 mL
  Administered 2014-02-26: 3 mL

## 2014-02-26 MED ORDER — SENNOSIDES-DOCUSATE SODIUM 8.6-50 MG PO TABS
2.0000 | ORAL_TABLET | ORAL | Status: DC
  Administered 2014-02-26 – 2014-02-27 (×2): 2 via ORAL
  Filled 2014-02-26 (×2): qty 2

## 2014-02-26 MED ORDER — FENTANYL 2.5 MCG/ML BUPIVACAINE 1/10 % EPIDURAL INFUSION (WH - ANES)
INTRAMUSCULAR | Status: DC | PRN
  Administered 2014-02-26: 12 mL/h via EPIDURAL

## 2014-02-26 MED ORDER — FENTANYL 2.5 MCG/ML BUPIVACAINE 1/10 % EPIDURAL INFUSION (WH - ANES)
14.0000 mL/h | INTRAMUSCULAR | Status: DC | PRN
  Filled 2014-02-26: qty 125

## 2014-02-26 MED ORDER — ACETAMINOPHEN 325 MG PO TABS: 650.0000 mg | ORAL_TABLET | ORAL | Status: DC | PRN

## 2014-02-26 MED ORDER — PHENYLEPHRINE 40 MCG/ML (10ML) SYRINGE FOR IV PUSH (FOR BLOOD PRESSURE SUPPORT)
80.0000 ug | PREFILLED_SYRINGE | INTRAVENOUS | Status: DC | PRN
  Filled 2014-02-26: qty 10

## 2014-02-26 MED ORDER — ONDANSETRON HCL 4 MG PO TABS: 4.0000 mg | ORAL_TABLET | ORAL | Status: DC | PRN

## 2014-02-26 MED ORDER — DIPHENHYDRAMINE HCL 25 MG PO CAPS: 25.0000 mg | ORAL_CAPSULE | Freq: Four times a day (QID) | ORAL | Status: DC | PRN

## 2014-02-26 MED ORDER — BENZOCAINE-MENTHOL 20-0.5 % EX AERO
1.0000 "application " | INHALATION_SPRAY | CUTANEOUS | Status: DC | PRN
  Filled 2014-02-26: qty 56

## 2014-02-26 NOTE — H&P (Signed)
April [name] is a 23 y.o. female  G1P0 at 3757w1d who presents with ROM.   Patient states that yesterday she lost her mucous plug after doing some squats and going up the stairs.  She began to have more frequent contractions.  Early this morning (650 am) she awoke and her "water broke".  She began to experience more frequent contractions w/ mild-moderate pain and subsequently went to the MAU for evaluation.   In the MAU she was found to be 4.5/100/0.  + Fern.  History OB History    Gravida Para Term Preterm AB TAB SAB Ectopic Multiple Living   1 0 0 0 0 0 0 0 0 0      Past Medical History  Diagnosis Date  . Medical history non-contributory    Past Surgical History  Procedure Laterality Date  . No past surgeries     Family History: family history includes Cancer in her maternal grandfather; Diabetes in her maternal aunt, maternal grandmother, and maternal uncle; Hypertension in her maternal grandmother and maternal uncle. There is no history of Hearing loss. Social History:  reports that she has never smoked. She has never used smokeless tobacco. She reports that she does not drink alcohol or use illicit drugs.  Prenatal Transfer Tool  Maternal Diabetes: No Genetic Screening: Not Done Maternal Ultrasounds/Referrals: Echogenic LV focus; Prior pyelectasis (now resolved).  Fetal Ultrasounds or other Referrals:  None Maternal Substance Abuse:  No Significant Maternal Medications:  None Significant Maternal Lab Results:  Lab values include: Group B Strep positive  ROS Per HPI  Blood pressure 122/79, pulse 103, temperature 98.6 F (37 C), temperature source Oral, resp. rate 18, height 5' (1.524 m), weight 180 lb (81.647 kg), last menstrual period 05/21/2013. Exam Physical Exam  Gen: well appearing, comfortable. NAD.  Heart: RRR Lungs: CTAB, NWOB Abd: gravid but otherwise soft, nontender to palpation Ext: no appreciable lower extremity edema bilaterally Neuro: No focal  deficits.  Cervical Exam:  Dilation: 4.5 Effacement (%): 100 Cervical Position: Middle Station: 0 Presentation: Vertex Exam by:: jolynn  FHR: baseline 135, mod variability, 15x15 accels, no decels Toco: Every 2-4 min.   Prenatal labs: ABO, Rh: A/POS/-- (04/27 0913) Antibody: NEG (04/27 0913) Rubella: 4.53 (04/27 0913) RPR: NON REAC (08/27 1147)  HBsAg: NEGATIVE (04/27 0913)  HIV: NONREACTIVE (08/27 1147)  GBS: Detected (11/02 1206)   Assessment/Plan: April [name] is a 23 y.o. G1P0000 at 5857w1d by LMP who presents with   #Labor: SROM; Active labor.  Expectant management.  May need pitocin augmentation. #Pain: Labor support w/o meds at this time; May have epidural upon request. #FWB:  Cat 1 #ID: GBS +; On PCN. #MOF: Breast. #MOC: Undecided; contemplating Nexplanon. #[facility]  Everlene OtherJayce Cook DO Family Medicine PGY-3 Pager #: [phone]  I was present for the exam and agree with above.  [place]ia Rindi Beechy, CNM 02/26/2014 7:24 PM

## 2014-02-26 NOTE — Plan of Care (Signed)
Problem: Consults Goal: Postpartum Patient Education (See Patient Education module for education specifics.)  Outcome: Completed/Met Date Met:  02/26/14

## 2014-02-26 NOTE — MAU Note (Signed)
Water broke at 0650. Clear fluid, still coming.   Some pains starting, no bleeding.

## 2014-02-26 NOTE — Anesthesia Preprocedure Evaluation (Signed)
Anesthesia Evaluation  Patient identified by MRN, date of birth, ID band Patient awake    Reviewed: Allergy & Precautions, H&P , Patient's Chart, lab work & pertinent test results  Airway Mallampati: III  TM Distance: >3 FB Neck ROM: full    Dental no notable dental hx. (+) Teeth Intact   Pulmonary neg pulmonary ROS,  breath sounds clear to auscultation  Pulmonary exam normal       Cardiovascular negative cardio ROS  Rhythm:regular Rate:Normal     Neuro/Psych negative neurological ROS  negative psych ROS   GI/Hepatic negative GI ROS, Neg liver ROS,   Endo/Other  Obesity  Renal/GU negative Renal ROS  negative genitourinary   Musculoskeletal   Abdominal   Peds  Hematology negative hematology ROS (+)   Anesthesia Other Findings   Reproductive/Obstetrics (+) Pregnancy                             Anesthesia Physical Anesthesia Plan  ASA: II  Anesthesia Plan: Epidural   Post-op Pain Management:    Induction:   Airway Management Planned:   Additional Equipment:   Intra-op Plan:   Post-operative Plan:   Informed Consent: I have reviewed the patients History and Physical, chart, labs and discussed the procedure including the risks, benefits and alternatives for the proposed anesthesia with the patient or authorized representative who has indicated his/her understanding and acceptance.     Plan Discussed with: Anesthesiologist  Anesthesia Plan Comments:         Anesthesia Quick Evaluation

## 2014-02-26 NOTE — Lactation Note (Signed)
This note was copied from the chart of April [name]. Lactation Consultation Note  Patient Name: April Vonita Mossytajha Byrom IOXBD'ZToday's Date: 02/26/2014 Reason for consult: Initial assessment Baby 6 hours of life. Mom reports baby acting somewhat hungry but won't latch. Baby seems to be swallowing hard/often, mom states baby born quickly. Discussed normal newborn behavior. Assisted mom to latch baby in football position to right breast. Mom's right nipple is flat, left nipple everts more but has short shaft. Discussed with mom that baby may be able to pull nipple out just fine, and if not, we have tools to assist. Baby sleepy, not wanting to latch after attempting to awaken. Baby's temperature has not been high enough for a bath. Enc mom to offer lots of STS, laid baby on mom's chest and covered both with a blanket. Discussed offering breast often, with feeding cues, and pumping right nipple just prior with hand pump to help evert nipple. Mom given [facility] brochure, aware of OP/BFSG, community resources, and [facility] phone [facility] services. Enc mom to call for assist with BF as needed.  Maternal Data Has patient been taught Hand Expression?: Yes Does the patient have breastfeeding experience prior to this delivery?: No  Feeding Feeding Type: Breast Fed Length of feed: 0 min  LATCH Score/Interventions Latch: Too sleepy or reluctant, no latch achieved, no sucking elicited. Intervention(s): Skin to skin;Waking techniques  Audible Swallowing: None Intervention(s): Hand expression;Alternate breast massage;Skin to skin  Type of Nipple: Everted at rest and after stimulation  Comfort (Breast/Nipple): Soft / non-tender     Hold (Positioning): Assistance needed to correctly position infant at breast and maintain latch. Intervention(s): Breastfeeding basics reviewed;Support Pillows;Position options;Skin to skin  LATCH Score: 5  Lactation Tools Discussed/Used Tools: Shells   Consult Status Consult Status:  Follow-up Date: 02/27/14 Follow-up type: In-patient    Geralynn OchsWILLIARD, Rashema Seawright 02/26/2014, 6:42 PM

## 2014-02-26 NOTE — Anesthesia Procedure Notes (Signed)
Epidural Patient location during procedure: OB Start time: 02/26/2014 11:17 AM  Preanesthetic Checklist Completed: patient identified, site marked, surgical consent, pre-op evaluation, timeout performed, IV checked, risks and benefits discussed and monitors and equipment checked  Epidural Patient position: sitting Prep: site prepped and draped and DuraPrep Patient monitoring: continuous pulse ox and blood pressure Approach: midline Location: L3-L4 Injection technique: LOR air  Needle:  Needle type: Tuohy  Needle gauge: 17 G Needle length: 9 cm and 9 Needle insertion depth: 4 cm Catheter type: closed end flexible Catheter size: 19 Gauge Catheter at skin depth: 9 cm Test dose: negative and Other  Assessment Events: blood not aspirated, injection not painful, no injection resistance, negative IV test and no paresthesia  Additional Notes Patient identified. Risks and benefits discussed including failed block, incomplete  Pain control, post dural puncture headache, nerve damage, paralysis, blood pressure Changes, nausea, vomiting, reactions to medications-both toxic and allergic and post Partum back pain. All questions were answered. Patient expressed understanding and wished to proceed. Sterile technique was used throughout procedure. Epidural site was Dressed with sterile barrier dressing. No paresthesias, signs of intravascular injection Or signs of intrathecal spread were encountered.  Patient was more comfortable after the epidural was dosed. Please see RN's note for documentation of vital signs and FHR which are stable.

## 2014-02-27 LAB — CBC
HCT: 33.9 % — ABNORMAL LOW (ref 36.0–46.0)
Hemoglobin: 11.7 g/dL — ABNORMAL LOW (ref 12.0–15.0)
MCH: 30.2 pg (ref 26.0–34.0)
MCHC: 34.5 g/dL (ref 30.0–36.0)
MCV: 87.6 fL (ref 78.0–100.0)
Platelets: 248 10*3/uL (ref 150–400)
RBC: 3.87 MIL/uL (ref 3.87–5.11)
RDW: 14 % (ref 11.5–15.5)
WBC: 11.6 10*3/uL — ABNORMAL HIGH (ref 4.0–10.5)

## 2014-02-27 NOTE — Lactation Note (Signed)
This note was copied from the chart of April [name]. Lactation Consultation Note Follow up visit at 32 hours of age.  Baby has been bottle fed in the past 24 hours, but MD requests follow up due to mom wanting to breastfeed.  MBU RN request assist for poor  Bottle feeding and no latching.  Baby has taken small bottle feedings today some taking 30 minutes for up to 10mls.  Baby has taken in 38mls in lifetime with a 9 hour break in between 0600 and 1500 feedings.  With gloved finger oral assessment baby does not suck with oral stimulation, baby noted to have high palate and heart shaped tongue when extended.  Baby has a loose hold on finger and does not seal latch well.  Baby bites and "humps" tongue in disorganized manner.  Short tight lingual frenulum noted, discussed with family how it might affect feedings.  Mom reports a few other family member 'tongue tied"  Attempted breast feeding baby unable to maintain hold at breast or feel stimulation to suck.  Attempted with nipple shield and baby did not have interest in feeding.  Last feeding > 3 hours and baby is not easily awakened for feeding.  Attempted bottle feeding with slow flow nipple baby did not do well.  Attempted syringe feeding with gloved finger and unable to stimulate suck.  MBU/NSY RN Clayborne DanaPatti assisted with bottle feeding  And was able to get 20 mls in baby over several minutes.  Instructed parents on feeding technique.  Encouraged mom to continue latch attempt and bottle feed every 3 hours or with feeding cues on demand.  Mom has WIC but does not have DEBP, discussed pump rental options and mom wants to have DEBP in room to encouraged milk productions.  Discussed set up and frequency of every 3 hours with hand expression.  Mom to give baby EBM when collected. Mom and FOB plan to talk with MD about possible tongue restriction with tight frenulum and if feedings do not improve over night a consult to speech language pathology or feeding specialist  maybe warranted to assess for function vs disorganization of suck and swallow. [facility] who was also at bedside for part of visit.   Mom to call for assist as needed.      Patient Name: April [name] ZOXWR'UToday's Date: 02/27/2014 Reason for consult: Follow-up assessment;Difficult latch   Maternal Data    Feeding Feeding Type: Breast Fed Length of feed: 0 min  LATCH Score/Interventions Latch: Too sleepy or reluctant, no latch achieved, no sucking elicited.  Audible Swallowing: None  Type of Nipple: Flat Intervention(s): Hand pump  Comfort (Breast/Nipple): Soft / non-tender     Hold (Positioning): Assistance needed to correctly position infant at breast and maintain latch. Intervention(s): Skin to skin;Position options;Support Pillows;Breastfeeding basics reviewed  LATCH Score: 4  Lactation Tools Discussed/Used WIC Program: Yes Pump Review: Setup, frequency, and cleaning Initiated by:: JS Date initiated:: 02/27/14   Consult Status Consult Status: Follow-up Date: 02/28/14 Follow-up type: In-patient    April [name] 02/27/2014, 9:05 PM

## 2014-02-27 NOTE — Progress Notes (Signed)
Post Partum Day 1 Subjective: Patient reports she is doing well, she is just tired.  Baby having difficulty with feeding/latching.  Up ad lib; Voiding well; No BM or flatus yet. Lochia appropriate.  Objective: Blood pressure 115/68, pulse 90, temperature 98 F (36.7 C), temperature source Oral, resp. rate 18, height 5' (1.524 m), weight 180 lb (81.647 kg), last menstrual period 05/21/2013, SpO2 99 %, unknown if currently breastfeeding.  Physical Exam:  General: alert, cooperative and no distress Lochia: appropriate Uterine Fundus: firm DVT Evaluation: No evidence of DVT seen on physical exam. No cords or calf tenderness.   Recent Labs  02/26/14 0845 02/27/14 0550  HGB 12.8 11.7*  HCT 36.9 33.9*    Assessment/Plan: PPD # 1 - Doing well. Lactation consult. Continued discussions about birth control - ? Nexplanon  Plan for discharge home tomorrow.   LOS: 1 day   Everlene OtherJayce Cook DO Family Medicine PGY-3 Pager #: [phone]   I have seen and examined this patient and agree the above assessment. CRESENZO-DISHMAN,Elonzo Sopp 03/03/2014 12:33 PM

## 2014-02-27 NOTE — Plan of Care (Signed)
Problem: Phase II Progression Outcomes Goal: Tolerating diet Outcome: Completed/Met Date Met:  02/27/14     

## 2014-02-27 NOTE — Plan of Care (Signed)
Problem: Phase II Progression Outcomes Goal: Pain controlled on oral analgesia Outcome: Completed/Met Date Met:  02/27/14

## 2014-02-27 NOTE — Plan of Care (Signed)
Problem: Phase II Progression Outcomes Goal: Progress activity as tolerated unless otherwise ordered Outcome: Completed/Met Date Met:  02/27/14     

## 2014-02-27 NOTE — Anesthesia Postprocedure Evaluation (Signed)
Anesthesia Post Note  Patient: April [name]  Procedure(s) Performed: * No procedures listed *  Anesthesia type: Epidural  Patient location: Mother/Baby  Post pain: Pain level controlled  Post assessment: Post-op Vital signs reviewed  Last Vitals:  Filed Vitals:   02/27/14 0545  BP: 115/68  Pulse: 90  Temp: 36.7 C  Resp: 18    Post vital signs: Reviewed  Level of consciousness:alert  Complications: No apparent anesthesia complications

## 2014-02-27 NOTE — Plan of Care (Signed)
Problem: Phase II Progression Outcomes Goal: Afebrile, VS remain stable Outcome: Completed/Met Date Met:  02/27/14 Goal: Other Phase II Outcomes/Goals Outcome: Completed/Met Date Met:  02/27/14

## 2014-02-28 MED ORDER — IBUPROFEN 600 MG PO TABS: 600.0000 mg | ORAL_TABLET | Freq: Four times a day (QID) | ORAL | Status: DC

## 2014-02-28 MED ORDER — DOCUSATE SODIUM 100 MG PO CAPS: 100.0000 mg | ORAL_CAPSULE | Freq: Two times a day (BID) | ORAL | Status: DC | PRN

## 2014-02-28 MED ORDER — PRENATAL 27-0.8 MG PO TABS: 1.0000 | ORAL_TABLET | Freq: Every day | ORAL | Status: DC

## 2014-02-28 NOTE — Discharge Summary (Signed)
Obstetric Discharge Summary Reason for Admission: SROM, Active Labor Prenatal Procedures: ultrasound Intrapartum Procedures: spontaneous vaginal delivery Postpartum Procedures: none Complications-Operative and Postpartum: None.  HEMOGLOBIN  Date Value Ref Range Status  02/27/2014 11.7* 12.0 - 15.0 g/dL Final   HCT  Date Value Ref Range Status  02/27/2014 33.9* 36.0 - 46.0 % Final    Physical Exam:  General: alert, cooperative and no distress Lochia: appropriate Uterine Fundus: firm DVT Evaluation: No evidence of DVT seen on physical exam. No cords or calf tenderness. No significant calf/ankle edema.  Discharge Diagnoses: Term Pregnancy-delivered  Brief Hospital Course: Patient admitted on 11/19 at 5186w1d for SROM/Active Labor.  Patient progressed quickly and delivered a healthy female infant via NSVD.  No delivery or postpartum complications.  Patient is breast and bottle feeding and is unsure of contraception (but is considering nexplanon).    Discharge Information: Date: 02/28/2014 Activity: pelvic rest Diet: routine Medications: PNV, Ibuprofen and Colace Condition: stable Instructions: refer to practice specific booklet Discharge to: home   Newborn Data: Live born female  Birth Weight: 6 lb 12.2 oz (3067 g) APGAR: 9, 9  Home with mother.  Everlene OtherJayce Cook DO Family Medicine PGY-3 Pager #: [phone]  I have seen and examined this patient and I agree with the above. Cam HaiSHAW, KIMBERLY CNM 9:34 AM 02/28/2014

## 2014-02-28 NOTE — Lactation Note (Signed)
This note was copied from the chart of April Pema Deboard. Lactation Consultation Note  Mom has been pumping every three hours. [facility] about [facility].  She really wants to BF but is afraid she may denied formula if she needs it. Will encourage follow-up on an outpatient basis. Patient Name: April Vonita Mossytajha Depaoli RUEAV'WToday's Date: 02/28/2014     Maternal Data    Feeding Feeding Type: Bottle Fed - Formula  LATCH Score/Interventions                      Lactation Tools Discussed/Used     Consult Status      April DryerJoseph, April [name] 02/28/2014, 12:51 PM

## 2014-02-28 NOTE — Discharge Instructions (Signed)
Please follow up in 6 weeks. No sex for 6 weeks (pelvic rest).   Continue prenatal vitamin. Please continue to try to breast feed.  Lactation is always here to help.   Vaginal Delivery, Care After Refer to this sheet in the next few weeks. These discharge instructions provide you with information on caring for yourself after delivery. Your caregiver may also give you specific instructions. Your treatment has been planned according to the most current medical practices available, but problems sometimes occur. Call your caregiver if you have any problems or questions after you go home. HOME CARE INSTRUCTIONS  Take over-the-counter or prescription medicines only as directed by your caregiver or pharmacist.  Do not drink alcohol, especially if you are breastfeeding or taking medicine to relieve pain.  Do not chew or smoke tobacco.  Do not use illegal drugs.  Continue to use good perineal care. Good perineal care includes:  Wiping your perineum from front to back.  Keeping your perineum clean.  Do not use tampons or douche until your caregiver says it is okay.  Shower, wash your hair, and take tub baths as directed by your caregiver.  Wear a well-fitting bra that provides breast support.  Eat healthy foods.  Drink enough fluids to keep your urine clear or pale yellow.  Eat high-fiber foods such as whole grain cereals and breads, brown rice, beans, and fresh fruits and vegetables every day. These foods may help prevent or relieve constipation.  Follow your caregiver's recommendations regarding resumption of activities such as climbing stairs, driving, lifting, exercising, or traveling.  Talk to your caregiver about resuming sexual activities. Resumption of sexual activities is dependent upon your risk of infection, your rate of healing, and your comfort and desire to resume sexual activity.  Try to have someone help you with your household activities and your newborn for at least a  few days after you leave the hospital.  Rest as much as possible. Try to rest or take a nap when your newborn is sleeping.  Increase your activities gradually.  Keep all of your scheduled postpartum appointments. It is very important to keep your scheduled follow-up appointments. At these appointments, your caregiver will be checking to make sure that you are healing physically and emotionally. SEEK MEDICAL CARE IF:   You are passing large clots from your vagina. Save any clots to show your caregiver.  You have a foul smelling discharge from your vagina.  You have trouble urinating.  You are urinating frequently.  You have pain when you urinate.  You have a change in your bowel movements.  You have increasing redness, pain, or swelling near your vaginal incision (episiotomy) or vaginal tear.  You have pus draining from your episiotomy or vaginal tear.  Your episiotomy or vaginal tear is separating.  You have painful, hard, or reddened breasts.  You have a severe headache.  You have blurred vision or see spots.  You feel sad or depressed.  You have thoughts of hurting yourself or your newborn.  You have questions about your care, the care of your newborn, or medicines.  You are dizzy or light-headed.  You have a rash.  You have nausea or vomiting.  You were breastfeeding and have not had a menstrual period within 12 weeks after you stopped breastfeeding.  You are not breastfeeding and have not had a menstrual period by the 12th week after delivery.  You have a fever. SEEK IMMEDIATE MEDICAL CARE IF:   You have persistent pain.  You have chest pain.  You have shortness of breath.  You faint.  You have leg pain.  You have stomach pain.  Your vaginal bleeding saturates two or more sanitary pads in 1 hour. MAKE SURE YOU:   Understand these instructions.  Will watch your condition.  Will get help right away if you are not doing well or get  worse. Document Released: 03/24/2000 Document Revised: 08/11/2013 Document Reviewed: 11/22/2011 [facility] Patient Information 2015 [employer] [place]. This information is not intended to replace advice given to you by your health care provider. Make sure you discuss any questions you have with your health care provider.

## 2014-03-02 NOTE — Progress Notes (Signed)
Ur chart review completed.  

## 2014-04-07 ENCOUNTER — Ambulatory Visit (INDEPENDENT_AMBULATORY_CARE_PROVIDER_SITE_OTHER): Payer: Medicaid Other | Admitting: Family Medicine

## 2014-04-07 DIAGNOSIS — Z30019 Encounter for initial prescription of contraceptives, unspecified: Secondary | ICD-10-CM

## 2014-04-07 DIAGNOSIS — Z308 Encounter for other contraceptive management: Secondary | ICD-10-CM

## 2014-04-07 DIAGNOSIS — Z30017 Encounter for initial prescription of implantable subdermal contraceptive: Secondary | ICD-10-CM | POA: Insufficient documentation

## 2014-04-07 MED ORDER — ETONOGESTREL 68 MG ~~LOC~~ IMPL
68.0000 mg | DRUG_IMPLANT | Freq: Once | SUBCUTANEOUS | Status: AC
  Administered 2014-04-07: 68 mg via SUBCUTANEOUS

## 2014-04-07 NOTE — Progress Notes (Signed)
  Subjective:     April [name] is a 23 y.o. female who presents for a postpartum visit. She is 6 weeks postpartum following a spontaneous vaginal delivery. I have fully reviewed the prenatal and intrapartum course. The delivery was at 4340w1day gestational weeks. Outcome: spontaneous vaginal delivery. Anesthesia: epidural. Postpartum course has been unremarkable.. Baby's course has been unremarkable. Baby is feeding by bottle - Gerber Gentle.. Bleeding no bleeding. Bowel function is normal. Bladder function is normal. Patient is not sexually active at this time. Contraception method is none.  Desires contraception today.  Review of Systems Per HPI with the following additions: No reports of breast tenderness or pain.    Objective:    BP 125/59 mmHg  Pulse 74  Temp(Src) 98.5 F (36.9 C) (Oral)  Ht 5' (1.524 m)  Wt 161 lb 8 oz (73.256 kg)  BMI 31.54 kg/m2  Exam: General: well appearing female in NAD.  Cardiovascular: RRR. No murmurs, rubs, or gallops. Respiratory: CTAB. No rales, rhonchi, or wheeze. Abdomen: soft, nontender, nondistended.  Nexplanon Insertion Procedure Patient was given informed consent, she signed consent form.  Patient does understand that irregular bleeding is a very common side effect of this medication. Appropriate time out taken.  Patient's left arm was prepped and draped in the usual sterile fashion. The ruler used to measure and mark insertion area.  Patient was prepped with alcohol swab and then injected ~ 3 mL of 1% lidocaine.  She was prepped with betadine, Nexplanon removed from packaging,  Device confirmed in needle, then inserted full length of needle and withdrawn per handbook instructions. Nexplanon was able to palpated in the patient's arm.  There was minimal blood loss.  Patient insertion site covered with guaze and a pressure bandage to reduce any bruising.  The patient tolerated the procedure well and was given post procedure instructions.  Assessment:     6 week postpartum exam.   Plan:   Patient doing well in the postpartum period and enjoying being a new mother.    1) Contraception - After much discussion during prenatal care and immediately postpartum, patient elected to have Nexplanon placed.  Risk and benefits were discuss and procedure was performed as above.  Follow up annually or sooner if needed.

## 2014-04-07 NOTE — Assessment & Plan Note (Signed)
Inserted today without complication.

## 2014-04-25 ENCOUNTER — Telehealth: Payer: Self-pay | Admitting: Family Medicine

## 2014-04-25 NOTE — Telephone Encounter (Signed)
Pt states that she noticed that she has a "boil" that she noticed yesterday on her arm down from her nexplanon. Her nexplanon was inserted 04/07/2014. Pt states the area is small, red,tender and "hard". She denies any fevers or drainage from the area.  - I explained to her that it is possible that she has a skin infection of some type, that may or may not have to do with her nexplanon.  - I advised her to apply warm compresses to the area, take tylenol or motrin for the discomfort and she would need to be evaluated at an urgent care as soon as possible.  Felix PaciniKuneff, Renee DO PGY3 [facility]

## 2014-05-12 ENCOUNTER — Telehealth: Payer: Self-pay | Admitting: Family Medicine

## 2014-05-12 NOTE — Telephone Encounter (Signed)
Opened in error

## 2015-06-09 ENCOUNTER — Encounter (HOSPITAL_COMMUNITY): Payer: Self-pay | Admitting: Emergency Medicine

## 2015-06-09 ENCOUNTER — Emergency Department (INDEPENDENT_AMBULATORY_CARE_PROVIDER_SITE_OTHER)
Admission: EM | Admit: 2015-06-09 | Discharge: 2015-06-09 | Disposition: A | Discharge status: Home / Self Care | Payer: Self-pay | Source: Home / Self Care | Attending: Family Medicine | Admitting: Family Medicine

## 2015-06-09 ENCOUNTER — Emergency Department (INDEPENDENT_AMBULATORY_CARE_PROVIDER_SITE_OTHER): Payer: Self-pay

## 2015-06-09 DIAGNOSIS — R0602 Shortness of breath: Secondary | ICD-10-CM

## 2015-06-09 NOTE — Discharge Instructions (Signed)

## 2015-06-09 NOTE — ED Notes (Signed)
Sob onset last night, worsened as day has gone by.  Denies fever, denies cough.  Recent stomach virus in there home.

## 2015-06-10 NOTE — ED Provider Notes (Signed)
CSN: [id number]     Arrival date & time 06/09/15  1826 History   First MD Initiated Contact with Patient 06/09/15 1922     Chief Complaint  Patient presents with  . Shortness of Breath   (Consider location/radiation/quality/duration/timing/severity/associated sxs/prior Treatment) HPI Pt presents with the cc of sensation of SOB when laying on left side Symptoms started a few weeks ago Symptoms get worse with laying on left side in bed Symptoms get better with sitting up No previous symptoms of this nature Pain score 0 Other symptoms include no peripheral edema Some anxiety when going to bed  Past Medical History  Diagnosis Date  . Medical history non-contributory    Past Surgical History  Procedure Laterality Date  . No past surgeries    . Wisdom tooth extraction  2013   Family History  Problem Relation Age of Onset  . Diabetes Maternal Aunt   . Diabetes Maternal Uncle   . Hypertension Maternal Uncle   . Diabetes Maternal Grandmother   . Hypertension Maternal Grandmother   . Cancer Maternal Grandfather   . Hearing loss Neg Hx    Social History  Substance Use Topics  . Smoking status: Never Smoker   . Smokeless tobacco: Never Used  . Alcohol Use: No   OB History    Gravida Para Term Preterm AB TAB SAB Ectopic Multiple Living   0 0 0 0 0 0 1     Review of Systems ROS +'vesensation of SOB when laying on left side  Denies: HEADACHE, NAUSEA, ABDOMINAL PAIN, CHEST PAIN, CONGESTION, DYSURIA  Allergies  Eggs or egg-derived products; Peanut-containing drug products; and Fish allergy  Home Medications   Prior to Admission medications   Medication Sig Start Date End Date Taking? Authorizing Provider  etonogestrel (NEXPLANON) 68 MG IMPL implant 1 each by Subdermal route once.   Yes Historical Provider, MD  docusate sodium (COLACE) 100 MG capsule Take 1 capsule (100 mg total) by mouth 2 (two) times daily as needed for mild constipation or moderate constipation.  02/28/14   Tommie Sams, DO  ibuprofen (ADVIL,MOTRIN) 600 MG tablet Take 1 tablet (600 mg total) by mouth every 6 (six) hours. 02/28/14   Tommie Sams, DO  Prenatal Vit-Fe Fumarate-FA (MULTIVITAMIN-PRENATAL) 27-0.8 MG TABS tablet Take 1 tablet by mouth daily at 12 noon. 02/28/14   Tommie Sams, DO   Meds Ordered and Administered this Visit  Medications - No data to display  BP 102/67 mmHg  Pulse 88  Temp(Src) 98.3 F (36.8 C) (Oral)  Resp 16  SpO2 100%  LMP 05/12/2015  Breastfeeding? Unknown No data found.   Physical Exam NURSES NOTES AND VITAL SIGNS REVIEWED. CONSTITUTIONAL: Well developed, well nourished, no acute distress HEENT: normocephalic, atraumatic, right and left TM's are normal EYES: Conjunctiva normal NECK:normal ROM, supple, no adenopathy PULMONARY:No respiratory distress, normal effort, Lungs: CTAb/l, no wheezes, or increased work of breathing, no change in BS suggesting effusion CARDIOVASCULAR: RRR, no murmur, no peripheral edema ABDOMEN: soft, ND, NT, +'ve BS MUSCULOSKELETAL: Normal ROM of all extremities,  SKIN: warm and dry without rash PSYCHIATRIC: Mood and affect, behavior are normal   ED Course  Procedures (including critical care time)  Labs Review Labs Reviewed - No data to display  Imaging Review Dg Chest 2 View  06/09/2015  CLINICAL DATA:  Per pt: for a month, when lying on the left side, SOB and pain. Non-smoker. No history of cardiac or respiratory disease. Patient is not  a diabetic EXAM: CHEST  2 VIEW COMPARISON:  None. FINDINGS: Normal mediastinum and cardiac silhouette. Normal pulmonary vasculature. No evidence of effusion, infiltrate, or pneumothorax. No acute bony abnormality. IMPRESSION: No acute cardiopulmonary process. Electronically Signed   By: Genevive Bi M.D.   On: 06/09/2015 19:48     Visual Acuity Review  Right Eye Distance:   Left Eye Distance:   Bilateral Distance:    Right Eye Near:   Left Eye Near:    Bilateral  Near:        Review of reading of XR with patient without cause of symptoms. Suggest review by FP, and possible referral if symptoms persist.  MDM   1. Shortness of breath at rest    Patient is reassured that there is no indication for more advance testing at this time.  Patient is advised to continue home symptomatic treatment.  Patient is advised that if there are new or worsening symptoms or attend the emergency department, or contact primary care provider. Instructions of care provided discharged home in stable condition. Return to work/school note provided.  THIS NOTE WAS GENERATED USING A VOICE RECOGNITION SOFTWARE PROGRAM. ALL REASONABLE EFFORTS  WERE MADE TO PROOFREAD THIS DOCUMENT FOR ACCURACY.     Tharon Aquas, [place] 06/10/15 [phone]

## 2015-12-30 ENCOUNTER — Encounter: Payer: Medicaid Other | Admitting: Family Medicine

## 2016-01-04 NOTE — Progress Notes (Addendum)
Subjective:     Patient ID: Lorelee NewNytajha K Yakubov, female   DOB: July 29, 1990, 25 y.o.   MRN: [id number]  HPI Mrs. Dayton ScrapeMurray is a 25yo female presenting today for annual physical to follow up contraception management and reports of depression.  # Depression/Anxiety: -PHQ9 obtained with score of 11 and extremely difficult symptoms. Reports little interest or pleasure in doing things nearly every day. Reports feeling down or depressed nearly every day. Reports poor appetite or overeating several days of the last 2 weeks (some days she will only eat a meal a day, other times she eats all day long). Reports feeling like a failure nearly every day. Reports trouble concentrating several days of the last 2 weeks. Denies problem with sleeping. Reports having few interest. -GAD 7 obtained with score of 19. Reports feeling nervous or anxious nearly every day. Reports not being able to control her worrying nearly every day. Reports becoming easily annoyed or irritable. -Denies suicidal or homicidal ideation. -Reports anxiety symptoms since the age of 25. Reports this is hindered her from doing a lot of things in her life. Her main focus of worry revolves around supporting her child. -Reports she is too scared to drive. She is nervous she will kill herself, her child, or a stranger. -Denies being nervous in public places -Reports she is going to school to different times to pursue different careers. After studying she will often give up and get too nervous to take the final exam. -Reports good support system at home, including her mother, baby's father, and a good friend. -Currently in cosmetology school. States she does not enjoy it were fine interest in it. She is "just doing it" -Denies history of smoking, illicit drug use, alcohol use - Nexplanon placed in left arm in December 2015   # Health Maintenance: - No history of Pap Smear - Nexplanon placed in left arm 03/2014. Removal needed 03/2017 or sooner if  requested by patient.   Review of Systems Per HPI    Objective:   Physical Exam  Constitutional: She appears well-developed and well-nourished. No distress.  HENT:  Head: Normocephalic and atraumatic.  Cardiovascular: Normal rate and regular rhythm.   No murmur heard. Pulmonary/Chest: Effort normal. No respiratory distress. She has no wheezes.  Abdominal: Soft. She exhibits no distension. There is no tenderness.  Genitourinary:  Genitourinary Comments: No vaginal discharge noted. No cervical motion or adnexal tenderness. No vaginal wall tenderness.  Musculoskeletal: She exhibits no edema.  Skin: No rash noted.  Psychiatric: She has a normal mood and affect. Her behavior is normal.      Assessment and Plan:     Anxiety and depression -PHQ9 score of 11 on 01/05/2016  -GAD 7 score of 19 on 01/05/2016 -Referred to integrated care. First appointment this Friday, September 29 at 2:30 PM -Will initiate Zoloft 50 mg. Will titrate up as appropriate. -Follow-up in 4-6 weeks or sooner if needed   Healthcare maintenance -Pap smear obtained

## 2016-01-05 ENCOUNTER — Ambulatory Visit (INDEPENDENT_AMBULATORY_CARE_PROVIDER_SITE_OTHER): Payer: Medicaid Other | Admitting: Family Medicine

## 2016-01-05 ENCOUNTER — Other Ambulatory Visit (HOSPITAL_COMMUNITY)
Admission: RE | Admit: 2016-01-05 | Discharge: 2016-01-05 | Disposition: A | Discharge status: Home / Self Care | Payer: Medicaid Other | Source: Ambulatory Visit | Attending: Family Medicine | Admitting: Family Medicine

## 2016-01-05 ENCOUNTER — Encounter: Payer: Self-pay | Admitting: Family Medicine

## 2016-01-05 VITALS — BP 94/80 | HR 85 | Temp 98.5°F | Ht 60.0 in | Wt 175.0 lb

## 2016-01-05 DIAGNOSIS — F418 Other specified anxiety disorders: Secondary | ICD-10-CM

## 2016-01-05 DIAGNOSIS — Z1151 Encounter for screening for human papillomavirus (HPV): Secondary | ICD-10-CM | POA: Diagnosis not present

## 2016-01-05 DIAGNOSIS — Z124 Encounter for screening for malignant neoplasm of cervix: Secondary | ICD-10-CM

## 2016-01-05 DIAGNOSIS — F32A Depression, unspecified: Secondary | ICD-10-CM

## 2016-01-05 DIAGNOSIS — Z0000 Encounter for general adult medical examination without abnormal findings: Secondary | ICD-10-CM | POA: Insufficient documentation

## 2016-01-05 DIAGNOSIS — F329 Major depressive disorder, single episode, unspecified: Secondary | ICD-10-CM

## 2016-01-05 DIAGNOSIS — Z01419 Encounter for gynecological examination (general) (routine) without abnormal findings: Secondary | ICD-10-CM | POA: Insufficient documentation

## 2016-01-05 DIAGNOSIS — F419 Anxiety disorder, unspecified: Secondary | ICD-10-CM

## 2016-01-05 MED ORDER — SERTRALINE HCL 50 MG PO TABS: 50.0000 mg | ORAL_TABLET | Freq: Every day | ORAL | 0 refills | Status: DC

## 2016-01-05 NOTE — Progress Notes (Signed)
I was available as preceptor during this visit. Aubri Gathright J Abriana Saltos, MD   

## 2016-01-05 NOTE — Patient Instructions (Signed)
Thank you so much for coming to visit today! You are scheduled with [facility] this Friday at 2:30pm with Vaibhav. I have prescribed Zoloft 50mg  daily. This is the starting dose of this medication and we can titrate up after 1-2 weeks if needed--do not increase the dose yourself. Please return in 4-6 weeks.  Dr. Caroleen Hammanumley  Sertraline tablets What is this medicine? SERTRALINE (SER tra leen) is used to treat depression. It may also be used to treat obsessive compulsive disorder, panic disorder, post-trauma stress, premenstrual dysphoric disorder (PMDD) or social anxiety. This medicine may be used for other purposes; ask your health care provider or pharmacist if you have questions. What should I tell my health care provider before I take this medicine? They need to know if you have any of these conditions: -bipolar disorder or a family history of bipolar disorder -diabetes -glaucoma -heart disease -high blood pressure -history of irregular heartbeat -history of low levels of calcium, magnesium, or potassium in the blood -if you often drink alcohol -liver disease -receiving electroconvulsive therapy -seizures -suicidal thoughts, plans, or attempt; a previous suicide attempt by you or a family member -thyroid disease -an unusual or allergic reaction to sertraline, other medicines, foods, dyes, or preservatives -pregnant or trying to get pregnant -breast-feeding How should I use this medicine? Take this medicine by mouth with a glass of water. Follow the directions on the prescription label. You can take it with or without food. Take your medicine at regular intervals. Do not take your medicine more often than directed. Do not stop taking this medicine suddenly except upon the advice of your doctor. Stopping this medicine too quickly may cause serious side effects or your condition may worsen. A special MedGuide will be given to you by the pharmacist with each prescription and refill. Be  sure to read this information carefully each time. Talk to your pediatrician regarding the use of this medicine in children. While this drug may be prescribed for children as young as 7 years for selected conditions, precautions do apply. Overdosage: If you think you have taken too much of this medicine contact a poison control center or emergency room at once. NOTE: This medicine is only for you. Do not share this medicine with others. What if I miss a dose? If you miss a dose, take it as soon as you can. If it is almost time for your next dose, take only that dose. Do not take double or extra doses. What may interact with this medicine? Do not take this medicine with any of the following medications: -certain medicines for fungal infections like fluconazole, itraconazole, ketoconazole, posaconazole, voriconazole -cisapride -disulfiram -dofetilide -linezolid -MAOIs like Carbex, Eldepryl, Marplan, Nardil, and Parnate -metronidazole -methylene blue (injected into a vein) -pimozide -thioridazine -ziprasidone This medicine may also interact with the following medications: -alcohol -aspirin and aspirin-like medicines -certain medicines for depression, anxiety, or psychotic disturbances -certain medicines for irregular heart beat like flecainide, propafenone -certain medicines for migraine headaches like almotriptan, eletriptan, frovatriptan, naratriptan, rizatriptan, sumatriptan, zolmitriptan -certain medicines for sleep -certain medicines for seizures like carbamazepine, valproic acid, phenytoin -certain medicines that treat or prevent blood clots like warfarin, enoxaparin, dalteparin -cimetidine -digoxin -diuretics -fentanyl -furazolidone -isoniazid -lithium -NSAIDs, medicines for pain and inflammation, like ibuprofen or naproxen -other medicines that prolong the QT interval (cause an abnormal heart rhythm) -procarbazine -rasagiline -supplements like St. John's wort, kava kava,  valerian -tolbutamide -tramadol -tryptophan This list may not describe all possible interactions. Give your health care provider a  list of all the medicines, herbs, non-prescription drugs, or dietary supplements you use. Also tell them if you smoke, drink alcohol, or use illegal drugs. Some items may interact with your medicine. What should I watch for while using this medicine? Tell your doctor if your symptoms do not get better or if they get worse. Visit your doctor or health care professional for regular checks on your progress. Because it may take several weeks to see the full effects of this medicine, it is important to continue your treatment as prescribed by your doctor. Patients and their families should watch out for new or worsening thoughts of suicide or depression. Also watch out for sudden changes in feelings such as feeling anxious, agitated, panicky, irritable, hostile, aggressive, impulsive, severely restless, overly excited and hyperactive, or not being able to sleep. If this happens, especially at the beginning of treatment or after a change in dose, call your health care professional. April [name] may get drowsy or dizzy. Do not drive, use machinery, or do anything that needs mental alertness until you know how this medicine affects you. Do not stand or sit up quickly, especially if you are an older patient. This reduces the risk of dizzy or fainting spells. Alcohol may interfere with the effect of this medicine. Avoid alcoholic drinks. Your mouth may get dry. Chewing sugarless gum or sucking hard candy, and drinking plenty of water may help. Contact your doctor if the problem does not go away or is severe. What side effects may I notice from receiving this medicine? Side effects that you should report to your doctor or health care professional as soon as possible: -allergic reactions like skin rash, itching or hives, swelling of the face, lips, or tongue -black or bloody stools, blood in the  urine or vomit -fast, irregular heartbeat -feeling faint or lightheaded, falls -hallucination, loss of contact with reality -seizures -suicidal thoughts or other mood changes -unusual bleeding or bruising -unusually weak or tired -vomiting Side effects that usually do not require medical attention (report to your doctor or health care professional if they continue or are bothersome): -change in appetite -change in sex drive or performance -diarrhea -increased sweating -indigestion, nausea -tremors This list may not describe all possible side effects. Call your doctor for medical advice about side effects. You may report side effects to FDA at 1-800-FDA-1088. Where should I keep my medicine? Keep out of the reach of children. Store at room temperature between 15 and 30 degrees C (59 and 86 degrees F). Throw away any unused medicine after the expiration date. NOTE: This sheet is a summary. It may not cover all possible information. If you have questions about this medicine, talk to your doctor, pharmacist, or health care provider.    2016, Elsevier/Gold Standard. (2012-10-22 12:57:35)

## 2016-01-05 NOTE — Assessment & Plan Note (Signed)
-  PHQ9 score of 11 on 01/05/2016  -GAD 7 score of 19 on 01/05/2016 -Referred to integrated care. First appointment this Friday, September 29 at 2:30 PM -Will initiate Zoloft 50 mg. Will titrate up as appropriate. -Follow-up in 4-6 weeks or sooner if needed

## 2016-01-05 NOTE — Assessment & Plan Note (Addendum)
Pap smear obtained. 

## 2016-01-07 ENCOUNTER — Ambulatory Visit (INDEPENDENT_AMBULATORY_CARE_PROVIDER_SITE_OTHER): Payer: Medicaid Other | Admitting: Psychology

## 2016-01-07 DIAGNOSIS — F329 Major depressive disorder, single episode, unspecified: Secondary | ICD-10-CM

## 2016-01-07 DIAGNOSIS — F419 Anxiety disorder, unspecified: Secondary | ICD-10-CM

## 2016-01-07 LAB — CYTOLOGY - PAP

## 2016-01-07 NOTE — Assessment & Plan Note (Addendum)
Assessment/Plan/Recommendations: Patient able to communicate her difficulties with excellent insight into her difficulties. Patient occasionally tearful. We discussed anxiety and how this leads to avoidance of activities that she values. 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 Patient feels doubt and disappointment in herself due to her avoidance. She has skipped exams due to fear of failure. MDQ not elevated. We discussed how the patient's thoughts affect her behavior and emotions, and began to challenge the notion that avoidance was better than showing up and "failing". Patient identified wanting to begin by dealing with her anxiety around driving. She will practice spending time in her father's car in the driver's seat in park. She will monitor her negative thoughts and how they affect her mood and behavior.

## 2016-01-07 NOTE — Progress Notes (Signed)
Reason for follow-up:  Patient having difficulties with anxiety and depression. Symptoms have come to the point where patient feels like she can't manage them on her own and needs help.  Issues discussed:  Patient's worry about failing has resulted in her withdrawing from activities that are important to her like exams for CNA school, Cosmetology school, and driving resulting in her feel down and like a failure for avoidance.   Identified goals:  Patient will spend 10 minutes in her parent's car, in the driver's seat with the engine on, parked. She will monitor automatic negative thoughts and observe how they affect her behavior and emotions. Patient will pick up her prescription for Zoloft tonight and begin taking it. Patient will follow-up in 2 weeks.

## 2016-01-21 ENCOUNTER — Ambulatory Visit: Payer: Medicaid Other

## 2017-01-01 ENCOUNTER — Ambulatory Visit: Payer: Self-pay | Admitting: Student

## 2017-01-11 ENCOUNTER — Other Ambulatory Visit (HOSPITAL_COMMUNITY)
Admission: RE | Admit: 2017-01-11 | Discharge: 2017-01-11 | Disposition: A | Discharge status: Home / Self Care | Payer: Medicaid Other | Source: Ambulatory Visit | Attending: Family Medicine | Admitting: Family Medicine

## 2017-01-11 ENCOUNTER — Encounter: Payer: Self-pay | Admitting: Family Medicine

## 2017-01-11 ENCOUNTER — Ambulatory Visit (INDEPENDENT_AMBULATORY_CARE_PROVIDER_SITE_OTHER): Payer: Medicaid Other | Admitting: Family Medicine

## 2017-01-11 VITALS — BP 94/58 | HR 88 | Temp 99.0°F | Ht 60.0 in | Wt 171.8 lb

## 2017-01-11 DIAGNOSIS — Z113 Encounter for screening for infections with a predominantly sexual mode of transmission: Secondary | ICD-10-CM | POA: Insufficient documentation

## 2017-01-11 LAB — POCT WET PREP (WET MOUNT)
CLUE CELLS WET PREP WHIFF POC: POSITIVE
Trichomonas Wet Prep HPF POC: ABSENT

## 2017-01-11 NOTE — Progress Notes (Signed)
   Subjective:    April [name] - 26 y.o. female MRN [id number]  Date of birth: October 19, 1990  HPI  April [name] is here for STD testing. - no current sexual partners, but reports having unprotected sex with a female partner in the recent past - no reports of changes in vaginal discharge, pain, itching, or change in odor - declines HIV/RPR testing for now due to time constraints   Health Maintenance:  Health Maintenance Due  Topic Date Due  . CHLAMYDIA SCREENING  12/25/2014    -  reports that she has never smoked. She has never used smokeless tobacco. - Review of Systems: Per HPI. - Past Medical History: Patient Active Problem List   Diagnosis Date Noted  . Screen for STD (sexually transmitted disease) 01/11/2017  . Anxiety and depression 01/05/2016  . Healthcare maintenance 01/05/2016  . Contraception management 11/02/2010   - Medications: reviewed and updated   Objective:   Physical Exam BP (!) 94/58   Pulse 88   Temp 99 F (37.2 C) (Oral)   Ht 5' (1.524 m)   Wt 77.9 kg (171 lb 12.8 oz)   SpO2 99%   BMI 33.55 kg/m  Gen: NAD, alert, cooperative with exam, well-appearing CV: RRR, good S1/S2, no murmur, no edema, capillary refill brisk  Resp: CTABL, no wheezes, non-labored Skin: no rashes, normal turgor  Neuro: no gross deficits.  Psych: good insight, alert and oriented Genital/Reproductive: normal external female genitalia, scant white discharge, normal appearing cervix, no cervical motion tenderness  Assessment & Plan:   Screen for STD (sexually transmitted disease) Completed GC/Chlamydia and wet prep.  Counseled patient on safe sex practices.  Will call patient with results.      Lezlie Octave, M.D. 01/11/2017, 5:19 PM PGY-1, [facility]

## 2017-01-11 NOTE — Patient Instructions (Signed)
It was nice meeting you today Ms. Iglesia!  We will call you about your results from today.  I wish you the best of luck in all your endeavors!  Please make an appointment if you need to see me for any reason.  If you have any questions or concerns, please feel free to call the clinic.   Be well,  Dr. Frances Furbish

## 2017-01-11 NOTE — Assessment & Plan Note (Signed)
Completed GC/Chlamydia and wet prep.  Counseled patient on safe sex practices.  Will call patient with results.

## 2017-01-12 ENCOUNTER — Other Ambulatory Visit: Payer: Self-pay | Admitting: Family Medicine

## 2017-01-12 DIAGNOSIS — B9689 Other specified bacterial agents as the cause of diseases classified elsewhere: Secondary | ICD-10-CM | POA: Insufficient documentation

## 2017-01-12 DIAGNOSIS — N760 Acute vaginitis: Principal | ICD-10-CM

## 2017-01-12 NOTE — Progress Notes (Unsigned)
Patient's wet prep is positive for clue cells, so I will treat her for bacterial vaginosis with Flagyl 500 mg BID x 7 days.  I am having trouble e-prescribing, so I have contacted IT and will notify the patient and e-prescribe this medication once IT gets in touch with me.

## 2017-01-13 ENCOUNTER — Other Ambulatory Visit: Payer: Self-pay | Admitting: Family Medicine

## 2017-01-13 MED ORDER — METRONIDAZOLE 500 MG PO TABS: 500.0000 mg | ORAL_TABLET | Freq: Two times a day (BID) | ORAL | 0 refills | Status: DC

## 2017-01-15 ENCOUNTER — Other Ambulatory Visit: Payer: Self-pay | Admitting: Family Medicine

## 2017-01-15 DIAGNOSIS — A749 Chlamydial infection, unspecified: Secondary | ICD-10-CM

## 2017-01-15 LAB — CERVICOVAGINAL ANCILLARY ONLY
Chlamydia: POSITIVE — AB
NEISSERIA GONORRHEA: NEGATIVE

## 2017-01-15 MED ORDER — METRONIDAZOLE 500 MG PO TABS: 500.0000 mg | ORAL_TABLET | Freq: Two times a day (BID) | ORAL | 0 refills | Status: DC

## 2017-01-15 MED ORDER — AZITHROMYCIN 250 MG PO TABS: 1000.0000 mg | ORAL_TABLET | Freq: Once | ORAL | 0 refills | Status: AC

## 2017-01-15 NOTE — Progress Notes (Signed)
I called patient to let her know that her chlamydia test was positive and her wet prep was positive for bacterial vaginosis.  I told her that I had prescribed azithromycin to treat her chlamydia and Flagyl to treat her bacterial vaginosis.  I advised her to take all 4 pills of azithromycin once and to give the other four pills to her partner.  I also advised her to take Flagyl twice daily for 7 days and to not drink alcohol while taking this medication.  I answered all of her questions and encouraged her to call the clinic if she has more questions.

## 2017-04-18 ENCOUNTER — Other Ambulatory Visit (HOSPITAL_COMMUNITY)
Admission: RE | Admit: 2017-04-18 | Discharge: 2017-04-18 | Disposition: A | Discharge status: Home / Self Care | Payer: Medicaid Other | Source: Ambulatory Visit | Attending: Family Medicine | Admitting: Family Medicine

## 2017-04-18 ENCOUNTER — Encounter: Payer: Self-pay | Admitting: Family Medicine

## 2017-04-18 ENCOUNTER — Other Ambulatory Visit: Payer: Self-pay

## 2017-04-18 ENCOUNTER — Ambulatory Visit (INDEPENDENT_AMBULATORY_CARE_PROVIDER_SITE_OTHER): Payer: Medicaid Other | Admitting: Family Medicine

## 2017-04-18 ENCOUNTER — Ambulatory Visit: Payer: Medicaid Other | Admitting: Family Medicine

## 2017-04-18 VITALS — BP 94/58 | HR 95 | Temp 98.9°F | Ht 60.0 in | Wt 172.2 lb

## 2017-04-18 DIAGNOSIS — Z113 Encounter for screening for infections with a predominantly sexual mode of transmission: Secondary | ICD-10-CM

## 2017-04-18 DIAGNOSIS — Z309 Encounter for contraceptive management, unspecified: Secondary | ICD-10-CM

## 2017-04-18 NOTE — Patient Instructions (Signed)
Thank you for coming in today, it was so nice to see you! Today we talked about:    We are checking you for Gonorrhea and Chlamydia, syphilis, and HIV   Please follow up in the clinic ASAP with Dr. Jonathon JordanGambino or Dr. Frances FurbishWinfrey for Nexplanon removal and insertion. You can schedule this appointment at the front desk before you leave or call the clinic.  If we ordered any tests today, you will be notified via telephone of any abnormalities. If everything is normal you will get a letter in the mail.   If you have any questions or concerns, please do not hesitate to call the office at [phone]. You can also message me directly via MyChart.   Sincerely,  Anders Simmondshristina Kirklin Mcduffee, MD

## 2017-04-18 NOTE — Assessment & Plan Note (Signed)
Discussed with patient that we would be unable to remove and reinsert a Nexplanon with just one appointment slot and she would need to return for a 2 appointment slot visit.  She was agreeable to this.  Ordered urine GC chlamydia, RPR, HIV per patient request.

## 2017-04-18 NOTE — Progress Notes (Signed)
    Subjective:    Patient ID: April [name] , female   DOB: [birth year] , 27 y.o..   MRN: [id number]  HPI  April [name] is here for  Chief Complaint  Patient presents with  . Nexplanon Removal    1.  Patient coming in today with the hopes of getting her Nexplanon removed and a new one inserted.  She states that she had a Nexplanon placed in December 2015.  Patient would also like to be tested again for chlamydia and gonorrhea as she was previously positive.  She did take antibiotics for this.  She has not been symptomatic; no vaginal discharge, bleeding, dysuria, pelvic pain.  She notes that she is confused how she got an STD because both of her sexual partner said that they tested negative for STDs.  Review of Systems: Per HPI.  Past Medical History: Patient Active Problem List   Diagnosis Date Noted  . Chlamydia infection 01/15/2017  . Bacterial vaginosis 01/12/2017  . Screen for STD (sexually transmitted disease) 01/11/2017  . Anxiety and depression 01/05/2016  . Healthcare maintenance 01/05/2016  . Contraception management 11/02/2010    Medications: reviewed   Social Hx:  reports that  has never smoked. she has never used smokeless tobacco.   Objective:   BP (!) 94/58   Pulse 95   Temp 98.9 F (37.2 C) (Oral)   Ht 5' (1.524 m)   Wt 172 lb 3.2 oz (78.1 kg)   LMP 03/19/2017   SpO2 97%   BMI 33.63 kg/m  Physical Exam  Gen: NAD, alert, cooperative with exam, well-appearing Psych: good insight, normal mood and affect  Assessment & Plan:  Contraception management Discussed with patient that we would be unable to remove and reinsert a Nexplanon with just one appointment slot and she would need to return for a 2 appointment slot visit.  She was agreeable to this.  Ordered urine GC chlamydia, RPR, HIV per patient request.   Anders Simmondshristina Gambino, MD [facility], PGY-3

## 2017-04-19 ENCOUNTER — Encounter: Payer: Self-pay | Admitting: Family Medicine

## 2017-04-19 ENCOUNTER — Encounter (HOSPITAL_COMMUNITY): Payer: Self-pay | Admitting: Family Medicine

## 2017-04-19 LAB — URINE CYTOLOGY ANCILLARY ONLY
Chlamydia: NEGATIVE
Neisseria Gonorrhea: NEGATIVE

## 2017-04-19 LAB — RPR: RPR: NONREACTIVE

## 2017-04-19 LAB — HIV ANTIBODY (ROUTINE TESTING W REFLEX): HIV SCREEN 4TH GENERATION: NONREACTIVE

## 2017-04-30 ENCOUNTER — Other Ambulatory Visit: Payer: Self-pay

## 2017-04-30 ENCOUNTER — Ambulatory Visit (INDEPENDENT_AMBULATORY_CARE_PROVIDER_SITE_OTHER): Payer: Medicaid Other | Admitting: Family Medicine

## 2017-04-30 ENCOUNTER — Encounter: Payer: Self-pay | Admitting: Family Medicine

## 2017-04-30 VITALS — BP 100/60 | HR 87 | Temp 98.5°F | Ht 60.0 in | Wt 170.4 lb

## 2017-04-30 DIAGNOSIS — Z3049 Encounter for surveillance of other contraceptives: Secondary | ICD-10-CM | POA: Diagnosis present

## 2017-04-30 DIAGNOSIS — Z3046 Encounter for surveillance of implantable subdermal contraceptive: Secondary | ICD-10-CM

## 2017-04-30 NOTE — Patient Instructions (Signed)
Nexplanon Instructions After removal   Keep bandage clean and dry for 24 hours   May use ice/Tylenol/Ibuprofen for soreness or pain   If you develop fever, drainage or increased warmth from incision site-contact office immediately   

## 2017-04-30 NOTE — Progress Notes (Signed)
     PROCEDURE NOTE  April [name] is a 27 y.o. G1P1001 here for Nexplanon removal.  Last pap smear was on January 05, 2016 and was normal.  No other gynecologic concerns.   Nexplanon Removal Patient identified, informed consent performed, consent signed.   Appropriate time out taken. Nexplanon site identified.  Area prepped in usual sterile fashon. One ml of 1% lidocaine was used to anesthetize the area at the distal end of the implant. A small stab incision was made right beside the implant on the distal portion.  The Nexplanon rod was grasped using hemostats and removed without difficulty.  There was minimal blood loss. There were no complications.  3 ml of 1% lidocaine was injected around the incision for post-procedure analgesia.  Steri-strips were applied over the small incision.  A pressure bandage was applied to reduce any bruising.  The patient tolerated the procedure well and was given post procedure instructions.  Patient is planning to use abstinence for contraception.   Anders Simmondshristina Che Rachal, MD [facility], PGY-3

## 2017-05-28 ENCOUNTER — Encounter: Payer: Self-pay | Admitting: Internal Medicine

## 2017-05-28 ENCOUNTER — Ambulatory Visit (INDEPENDENT_AMBULATORY_CARE_PROVIDER_SITE_OTHER): Payer: Medicaid Other | Admitting: Internal Medicine

## 2017-05-28 ENCOUNTER — Other Ambulatory Visit: Payer: Self-pay

## 2017-05-28 VITALS — BP 108/72 | HR 97 | Temp 98.8°F | Ht 61.0 in | Wt 170.0 lb

## 2017-05-28 DIAGNOSIS — Z3049 Encounter for surveillance of other contraceptives: Secondary | ICD-10-CM | POA: Diagnosis present

## 2017-05-28 DIAGNOSIS — Z30019 Encounter for initial prescription of contraceptives, unspecified: Secondary | ICD-10-CM

## 2017-05-28 DIAGNOSIS — Z3046 Encounter for surveillance of implantable subdermal contraceptive: Secondary | ICD-10-CM

## 2017-05-28 DIAGNOSIS — Z30017 Encounter for initial prescription of implantable subdermal contraceptive: Secondary | ICD-10-CM

## 2017-05-28 LAB — POCT URINE PREGNANCY: PREG TEST UR: NEGATIVE

## 2017-05-28 MED ORDER — ETONOGESTREL 68 MG ~~LOC~~ IMPL
68.0000 mg | DRUG_IMPLANT | Freq: Once | SUBCUTANEOUS | Status: AC
  Administered 2017-05-28: 68 mg via SUBCUTANEOUS

## 2017-05-28 NOTE — Patient Instructions (Signed)
Congratulations on getting your Nexplanon placement!  Below is some important information about Nexplanon.  First remember that Nexplanon does not prevent sexually transmitted infections.  Condoms will help prevent sexually transmitted infections. The Nexplanon starts working 7 days after it was inserted.  There is a risk of getting pregnant if you have unprotected sex in those first 7 days after placement of the Nexplanon.  The Nexplanon lasts for 3 years but can be removed at any time.  You can become pregnant as early as 1 week after removal.  You can have a new Nexplanon put in after the old one is removed if you like.  Always tell other healthcare providers that you have a Nexplanon in your arm.  The Nexplanon was placed just under the skin.  Leave the outside bandage on for 24 hours.  Leave the smaller bandage on for 3-5 days or until it falls off on its own.  Keep the area clean and dry for 3-5 days. There is usually bruising or swelling at the insertion site for a few days to a week after placement.  If you see redness or pus draining from the insertion site, call us immediately.  Keep your user card with the date the implant was placed and the date the implant is to be removed.  The most common side effect is a change in your menstrual bleeding pattern.   This bleeding is generally not harmful to you but can be annoying.  Call or come in to see us if you have any concerns about the bleeding or if you have any side effects or questions.    

## 2017-05-28 NOTE — Progress Notes (Signed)
   Subjective:    April [name] - 27 y.o. female MRN [id number]  Date of birth: [birth year]  HPI  April [name] ScrapeMurray is a G1P1 female here for Nexplanon insertion. Reports she had Nexplanon in place until Jan 2019 [facility]. Used condoms in interim for contraception. Requests placement in left arm again.     Objective:   Physical Exam BP 108/72   Pulse 97   Temp 98.8 F (37.1 C) (Oral)   Ht 5\' 1"  (1.549 m)   Wt 170 lb (77.1 kg)   LMP 05/21/2017 (Exact Date)   SpO2 99%   Breastfeeding? No   BMI 32.12 kg/m  Gen: NAD, alert, cooperative with exam, well-appearing Psych: good insight, alert and oriented  PROCEDURE NOTE: NEXPLANON  INSERTION Patient given informed consent and signed copy in the chart.  Pregnancy test was negative. Appropriate time out was taken. Left  arm was prepped and draped in the usual sterile fashion. Appropriate measurement was made for insertion of nexplanon and landmarks identified, insertion site marked. Six cc of 1%lidocaine with epi was used for local anesthesia. Once anesthesia obtained, nexplanon was inserted in typical fashion. No complications. Pressure bandage applied to decrease bruising. Patient given follow up instructions should she experience redness, swelling at sight or fever in the next 24 hours. Patient given Nexplanon pocket card.    Assessment & Plan:   1. Encounter for initial prescription of implantable subdermal contraceptive Nexplanon placed as above without any complications. Instructions for care and back up method of contraception discussed. Potential side effects discussed. Return precautions for infection reviewed.   April [name], D.O. 05/28/2017, 2:54 PM PGY-3, [facility]

## 2017-09-07 ENCOUNTER — Encounter (HOSPITAL_COMMUNITY): Payer: Self-pay | Admitting: Emergency Medicine

## 2017-09-07 ENCOUNTER — Emergency Department (HOSPITAL_COMMUNITY)
Admission: EM | Admit: 2017-09-07 | Discharge: 2017-09-07 | Disposition: A | Discharge status: Home / Self Care | Payer: Medicaid Other | Attending: Emergency Medicine | Admitting: Emergency Medicine

## 2017-09-07 ENCOUNTER — Other Ambulatory Visit: Payer: Self-pay

## 2017-09-07 DIAGNOSIS — Z79899 Other long term (current) drug therapy: Secondary | ICD-10-CM | POA: Insufficient documentation

## 2017-09-07 DIAGNOSIS — Z91010 Allergy to peanuts: Secondary | ICD-10-CM | POA: Insufficient documentation

## 2017-09-07 DIAGNOSIS — J039 Acute tonsillitis, unspecified: Secondary | ICD-10-CM | POA: Insufficient documentation

## 2017-09-07 LAB — GROUP A STREP BY PCR: Group A Strep by PCR: NOT DETECTED

## 2017-09-07 MED ORDER — IBUPROFEN 400 MG PO TABS: 600.0000 mg | ORAL_TABLET | Freq: Once | ORAL | Status: DC

## 2017-09-07 MED ORDER — PENICILLIN G BENZATHINE 1200000 UNIT/2ML IM SUSP
1.2000 10*6.[IU] | Freq: Once | INTRAMUSCULAR | Status: AC
  Administered 2017-09-07: 1.2 10*6.[IU] via INTRAMUSCULAR
  Filled 2017-09-07: qty 2

## 2017-09-07 MED ORDER — PREDNISONE 10 MG PO TABS: 40.0000 mg | ORAL_TABLET | Freq: Every day | ORAL | 0 refills | Status: AC

## 2017-09-07 MED ORDER — ONDANSETRON 4 MG PO TBDP
4.0000 mg | ORAL_TABLET | Freq: Once | ORAL | Status: AC
  Administered 2017-09-07: 4 mg via ORAL
  Filled 2017-09-07: qty 1

## 2017-09-07 MED ORDER — ACETAMINOPHEN 325 MG PO TABS
650.0000 mg | ORAL_TABLET | Freq: Once | ORAL | Status: AC | PRN
  Administered 2017-09-07: 650 mg via ORAL
  Filled 2017-09-07 (×2): qty 2

## 2017-09-07 MED ORDER — LIDOCAINE VISCOUS HCL 2 % MT SOLN: 15.0000 mL | OROMUCOSAL | 0 refills | Status: DC | PRN

## 2017-09-07 MED ORDER — PREDNISONE 20 MG PO TABS
60.0000 mg | ORAL_TABLET | Freq: Once | ORAL | Status: AC
  Administered 2017-09-07: 60 mg via ORAL
  Filled 2017-09-07: qty 3

## 2017-09-07 MED ORDER — KETOROLAC TROMETHAMINE 30 MG/ML IJ SOLN
30.0000 mg | Freq: Once | INTRAMUSCULAR | Status: AC
  Administered 2017-09-07: 30 mg via INTRAMUSCULAR
  Filled 2017-09-07: qty 1

## 2017-09-07 NOTE — Discharge Instructions (Signed)
Please read and follow all provided instructions.  Your diagnoses today include: Bacterial Tonsillitis  Tests performed today include: Vital signs. See below for your results today.   Medications prescribed:  You were given antibiotics in the department. Please take steroids (prednisone)as prescribed. Please take Tylenol as needed for pain and fever Swish and gargle with viscous lidocaine as directed. Do not swallow.   Home care instructions:  This is a bacterial infection. Continue to stay well-hydrated. Gargle warm salt water and spit it out. Continued to alternate between Tylenol and ibuprofen for pain. May consider over-the-counter Benadryl for additional relief (decrease secretions). Also discard your toothbrush and begin using a new one in 3 days. Follow-up with your primary care doctor in this week for recheck of ongoing symptoms.   Follow-up instructions: Please follow-up with your primary care provider in 2-3 days for follow up.    Return instructions:  Return to the ED sooner for worsening condition, inability to swallow, breathing difficulty, new concerns.  Additional Information:  Your vital signs today were: BP 102/64 (BP Location: Right Arm)    Pulse (!) 112    Temp 99 F (37.2 C) (Oral)    Resp 18    SpO2 97%  If your blood pressure (BP) was elevated above 135/85 this visit, please have this repeated by your doctor within one month. ---------------

## 2017-09-07 NOTE — ED Notes (Signed)
Patient verbalized understanding of discharge instructions and denies any further needs or questions at this time. VS stable. Patient ambulatory with steady gait.  

## 2017-09-07 NOTE — ED Provider Notes (Signed)
[facility] EMERGENCY DEPARTMENT Provider Note   CSN: [id number] Arrival date & time: 09/07/17  1851     History   Chief Complaint Chief Complaint  Patient presents with  . Sore Throat    HPI April [name] is a 27 y.o. female who presents emergency department today for sore throat.  Patient states that she woke yesterday with a sore throat with associated dysphasia.  She notes that yesterday she took Tylenol Cold and flu for her symptoms without any relief.  She denies any antipyretics prior to arrival.  She was found to be febrile in triage.  She is unsure if she was running a fever prior to arrival as she did not take her temperature.  She denies any subjective fever or chills.  She denies any sick contacts.  Nothing makes her symptoms better.  Rates her current symptoms a 5/10. Denies inability to control secretions, N/V, abdominal pain, cough, congestion, ear fullness/pain/drainge, voice change, dental disease, or trauma.   HPI  Past Medical History:  Diagnosis Date  . Medical history non-contributory     Patient Active Problem List   Diagnosis Date Noted  . Chlamydia infection 01/15/2017  . Bacterial vaginosis 01/12/2017  . Screen for STD (sexually transmitted disease) 01/11/2017  . Anxiety and depression 01/05/2016  . Healthcare maintenance 01/05/2016  . Contraception management 11/02/2010    Past Surgical History:  Procedure Laterality Date  . NO PAST SURGERIES    . WISDOM TOOTH EXTRACTION  2013     OB History    Gravida  1   Para  1   Term  1   Preterm  0   AB  0   Living  1     SAB  0   TAB  0   Ectopic  0   Multiple  0   Live Births  1            Home Medications    Prior to Admission medications   Medication Sig Start Date End Date Taking? Authorizing Provider  metroNIDAZOLE (FLAGYL) 500 MG tablet Take 1 tablet (500 mg total) by mouth 2 (two) times daily. 01/15/17   Lennox Solders, MD  sertraline (ZOLOFT)  50 MG tablet Take 1 tablet (50 mg total) by mouth daily. 01/05/16   Araceli Bouche, DO    Family History Family History  Problem Relation Age of Onset  . Diabetes Maternal Aunt   . Diabetes Maternal Uncle   . Hypertension Maternal Uncle   . Diabetes Maternal Grandmother   . Hypertension Maternal Grandmother   . Cancer Maternal Grandfather   . Hearing loss Neg Hx     Social History Social History   Tobacco Use  . Smoking status: Never Smoker  . Smokeless tobacco: Never Used  Substance Use Topics  . Alcohol use: No  . Drug use: No     Allergies   Eggs or egg-derived products; Peanut-containing drug products; and Fish allergy   Review of Systems Review of Systems  All other systems reviewed and are negative.    Physical Exam Updated Vital Signs BP 108/68 (BP Location: Right Arm)   Pulse (!) 115   Temp (!) 103.2 F (39.6 C) (Oral)   Resp 17   SpO2 100%   Physical Exam  Constitutional: She appears well-developed and well-nourished. No distress.  HENT:  Head: Normocephalic and atraumatic.  Right Ear: Hearing, tympanic membrane, external ear and ear canal normal. No foreign bodies. Tympanic  membrane is not injected, not perforated, not erythematous, not retracted and not bulging.  Left Ear: Hearing, tympanic membrane, external ear and ear canal normal. No foreign bodies. Tympanic membrane is not injected, not perforated, not erythematous, not retracted and not bulging.  Nose: Nose normal. No mucosal edema, rhinorrhea, sinus tenderness, septal deviation or nasal septal hematoma.  No foreign bodies. Right sinus exhibits no maxillary sinus tenderness and no frontal sinus tenderness. Left sinus exhibits no maxillary sinus tenderness and no frontal sinus tenderness.  Patient has normal phonation is in control of her secretions.  No stridor.  Midline uvula without edema.  Soft palate rises symmetrically.  2+ bilateral tonsillar erythema with exudates.  No PTA.  No posterior  pharyngeal edema.  Tongue protrusion is normal.  No trismus.  No crepitus on neck palpation.  Patient with good dentition.  No gingival erythema or fluctuance.  No floor edema.  Mucous membranes moist.  Eyes: Pupils are equal, round, and reactive to light. Conjunctivae, EOM and lids are normal. Right eye exhibits no discharge. Left eye exhibits no discharge. Right conjunctiva is not injected. Left conjunctiva is not injected.  Neck: Trachea normal, normal range of motion, full passive range of motion without pain and phonation normal. Neck supple. No spinous process tenderness and no muscular tenderness present. No neck rigidity. No tracheal deviation and normal range of motion present.  No nuchal rigidity or meningismus  Cardiovascular: Normal rate and regular rhythm.  Pulmonary/Chest: Effort normal and breath sounds normal. No stridor. She has no wheezes.  Abdominal: Soft.  Lymphadenopathy:       Head (right side): No submental, no submandibular, no tonsillar, no preauricular, no posterior auricular and no occipital adenopathy present.       Head (left side): No submental, no submandibular, no tonsillar, no preauricular, no posterior auricular and no occipital adenopathy present.    She has cervical adenopathy.  Neurological: She is alert.  Skin: Skin is warm and dry. No rash noted.  Psychiatric: She has a normal mood and affect.  Nursing note and vitals reviewed.    ED Treatments / Results  Labs (all labs ordered are listed, but only abnormal results are displayed) Labs Reviewed  GROUP A STREP BY PCR    EKG None  Radiology No results found.  Procedures Procedures (including critical care time)  Medications Ordered in ED Medications  acetaminophen (TYLENOL) tablet 650 mg (has no administration in time range)  penicillin g benzathine (BICILLIN LA) 1200000 UNIT/2ML injection 1.2 Million Units (has no administration in time range)  predniSONE (DELTASONE) tablet 60 mg (has no  administration in time range)  ketorolac (TORADOL) 30 MG/ML injection 30 mg (has no administration in time range)     Initial Impression / Assessment and Plan / ED Course  I have reviewed the triage vital signs and the nursing notes.  Pertinent labs & imaging results that were available during my care of the patient were reviewed by me and considered in my medical decision making (see chart for details).     27 y.o. female with sore throat x 1 day. Pt febrile with tonsillar exudate, cervical lymphadenopathy, & dysphagia; diagnosis of bacterial pharyngitis. Treated in the ED with steroids, NSAIDs, Tylenol and PCN IM. Vitals improved. Pt appears mildly dehydrated, discussed importance of water rehydration. Presentation non concerning for PTA or RPA. No trismus or uvula deviation. Specific return precautions discussed. Pt able to drink water in ED without difficulty with intact air way.  Recommended PCP follow up.  Vitals:   09/07/17 1958 09/07/17 2123  BP: 108/68 102/64  Pulse: (!) 115 (!) 112  Resp: 17 18  Temp: (!) 103.2 F (39.6 C) 99 F (37.2 C)  TempSrc: Oral Oral  SpO2: 100% 97%    Final Clinical Impressions(s) / ED Diagnoses   Final diagnoses:  Tonsillitis    ED Discharge Orders        Ordered    predniSONE (DELTASONE) 10 MG tablet  Daily with breakfast     09/07/17 2145    lidocaine (XYLOCAINE) 2 % solution  As needed     09/07/17 2145       Princella Pellegrini 09/07/17 2145    Little, Ambrose Finland, MD 09/08/17 [phone]

## 2017-09-07 NOTE — ED Triage Notes (Signed)
Patient to ED c/o sore throat since yesterday. Febrile in triage.

## 2017-09-07 NOTE — ED Notes (Signed)
Patient given ginger ale and ice water per PO fluid challenge - tolerated well.

## 2017-09-25 ENCOUNTER — Ambulatory Visit: Payer: Medicaid Other | Admitting: Internal Medicine

## 2017-09-26 ENCOUNTER — Other Ambulatory Visit: Payer: Self-pay

## 2017-09-26 ENCOUNTER — Ambulatory Visit: Payer: Self-pay | Admitting: Family Medicine

## 2017-09-26 ENCOUNTER — Encounter: Payer: Self-pay | Admitting: Family Medicine

## 2017-09-26 VITALS — BP 99/58 | HR 68 | Temp 98.4°F | Wt 168.0 lb

## 2017-09-26 DIAGNOSIS — N898 Other specified noninflammatory disorders of vagina: Secondary | ICD-10-CM

## 2017-09-26 LAB — POCT WET PREP (WET MOUNT)
Clue Cells Wet Prep Whiff POC: NEGATIVE
TRICHOMONAS WET PREP HPF POC: ABSENT

## 2017-09-26 NOTE — Patient Instructions (Signed)
Thank you for coming in today, it was so nice to see you! Today we talked about:    Vaginal discharge: Your wet prep does not show any signs of infection or yeast.  You also did not have any yeast on exam.  We did not test for gonorrhea and chlamydia today, these are always possibilities of your symptoms.  Please follow up if your symptoms persist. If you have any questions or concerns, please do not hesitate to call the office at [phone]. You can also message me directly via MyChart.   Sincerely,  Anders Simmondshristina Ridhaan Dreibelbis, MD

## 2017-09-26 NOTE — Progress Notes (Signed)
   Subjective:    Patient ID: April [name] , female   DOB: March 09, 1991 , 27 y.o..   MRN: [id number]  HPI  April [name] is here for  Chief Complaint  Patient presents with  . Vaginal Discharge    1. VAGINAL DISCHARGE  Having vaginal discharge for 3 days. Discharge consistency: creamy Discharge color: white/clear Medications tried: nothing  Recent antibiotic use: no Sex in last month: yes Possible STD exposure:unsure  Symptoms Fever: no Dysuria:no Vaginal bleeding: no Abdomen or Pelvic pain: no Back pain: no Genital sores or ulcers:no Rash: no Pain during sex: no Missed menstrual period: irregular periods while on Nexplanon  ROS see HPI Smoking Status noted  Social Hx:  reports that she has never smoked. She has never used smokeless tobacco.   Objective:   BP (!) 99/58   Pulse 68   Temp 98.4 F (36.9 C) (Oral)   Wt 168 lb (76.2 kg)   LMP 05/11/2017   SpO2 99%   BMI 31.74 kg/m  Physical Exam  Gen: NAD, alert, cooperative with exam, well-appearing GYN:  External genitalia within normal limits.  Vaginal mucosa pink, moist, normal rugae.  Nonfriable cervix without lesions, clear discharge and some bleeding noted on speculum exam.  Bimanual exam revealed normal, nongravid uterus.  No cervical motion tenderness. No adnexal masses bilaterally.    Results for orders placed or performed in visit on 09/26/17  POCT Wet Prep [facility]([place]  Result Value Ref Range   Source Wet Prep POC VAG    WBC, Wet Prep HPF POC 0-3    Bacteria Wet Prep HPF POC Few Few   Clue Cells Wet Prep HPF POC None None   Clue Cells Wet Prep Whiff POC Negative Whiff    Yeast Wet Prep HPF POC None None   KOH Wet Prep POC None None   Trichomonas Wet Prep HPF POC Absent Absent     Assessment & Plan:   1. Vaginal discharge: Patient here for vaginal discharge wanting to be checked for yeast.  She denies any vaginal bleeding however on exam she does have some bleeding coming from the  cervical os.  When asked about her bleeding she notes that she was sexually active 2 hours prior to coming to the office.  I do not think the two are related.  Has a history of chlamydia infection, she was adamantly declining GC chlamydia testing today and only wanted wet prep.  Prep negative for any pathology.  Vaginal discharge likely physiologic. - POCT Wet Prep [facility]([place]) - Did not do GC chlamydia testing per patient request, discussed risks of this -Return precautions discussed   Anders Simmondshristina Gambino, MD [facility], PGY-3

## 2018-01-20 ENCOUNTER — Encounter (HOSPITAL_COMMUNITY): Payer: Self-pay | Admitting: Emergency Medicine

## 2018-01-20 ENCOUNTER — Other Ambulatory Visit: Payer: Self-pay

## 2018-01-20 ENCOUNTER — Emergency Department (HOSPITAL_COMMUNITY)
Admission: EM | Admit: 2018-01-20 | Discharge: 2018-01-20 | Disposition: A | Discharge status: Home / Self Care | Payer: Medicaid Other | Attending: Emergency Medicine | Admitting: Emergency Medicine

## 2018-01-20 DIAGNOSIS — Z91010 Allergy to peanuts: Secondary | ICD-10-CM | POA: Insufficient documentation

## 2018-01-20 DIAGNOSIS — J029 Acute pharyngitis, unspecified: Secondary | ICD-10-CM | POA: Insufficient documentation

## 2018-01-20 DIAGNOSIS — Z79899 Other long term (current) drug therapy: Secondary | ICD-10-CM | POA: Insufficient documentation

## 2018-01-20 LAB — GROUP A STREP BY PCR: GROUP A STREP BY PCR: NOT DETECTED

## 2018-01-20 MED ORDER — ACETAMINOPHEN 325 MG PO TABS
650.0000 mg | ORAL_TABLET | Freq: Once | ORAL | Status: AC
  Administered 2018-01-20: 650 mg via ORAL
  Filled 2018-01-20: qty 2

## 2018-01-20 MED ORDER — LIDOCAINE VISCOUS HCL 2 % MT SOLN: 15.0000 mL | OROMUCOSAL | 0 refills | Status: DC | PRN

## 2018-01-20 NOTE — ED Triage Notes (Signed)
Pt. Stated, I think I have tonsillitis , I don't have a sore throat but its swollen

## 2018-01-20 NOTE — ED Provider Notes (Signed)
[facility] EMERGENCY DEPARTMENT Provider Note   CSN: [id number] Arrival date & time: 01/20/18  5284     History   Chief Complaint Chief Complaint  Patient presents with  . Sore Throat    HPI April [name] is a 27 y.o. female who presents to ED for evaluation of sore throat for 1 week.  States that she feels irritation when clearing her throat, and feeling like her tonsils are swollen.  She does not take any medications to help with her symptoms.  Denies any other URI symptoms, fever, cough, trismus, drooling, sick contacts.  HPI  Past Medical History:  Diagnosis Date  . Medical history non-contributory     Patient Active Problem List   Diagnosis Date Noted  . Chlamydia infection 01/15/2017  . Bacterial vaginosis 01/12/2017  . Screen for STD (sexually transmitted disease) 01/11/2017  . Anxiety and depression 01/05/2016  . Healthcare maintenance 01/05/2016  . Contraception management 11/02/2010    Past Surgical History:  Procedure Laterality Date  . NO PAST SURGERIES    . WISDOM TOOTH EXTRACTION  2013     OB History    Gravida  1   Para  1   Term  1   Preterm  0   AB  0   Living  1     SAB  0   TAB  0   Ectopic  0   Multiple  0   Live Births  1            Home Medications    Prior to Admission medications   Medication Sig Start Date End Date Taking? Authorizing Provider  lidocaine (XYLOCAINE) 2 % solution Use as directed 15 mLs in the mouth or throat as needed for mouth pain. 01/20/18   Lamaria Hildebrandt, PA-C  metroNIDAZOLE (FLAGYL) 500 MG tablet Take 1 tablet (500 mg total) by mouth 2 (two) times daily. 01/15/17   Lennox Solders, MD  sertraline (ZOLOFT) 50 MG tablet Take 1 tablet (50 mg total) by mouth daily. 01/05/16   Araceli Bouche, DO    Family History Family History  Problem Relation Age of Onset  . Diabetes Maternal Aunt   . Diabetes Maternal Uncle   . Hypertension Maternal Uncle   . Diabetes Maternal  Grandmother   . Hypertension Maternal Grandmother   . Cancer Maternal Grandfather   . Hearing loss Neg Hx     Social History Social History   Tobacco Use  . Smoking status: Never Smoker  . Smokeless tobacco: Never Used  Substance Use Topics  . Alcohol use: No  . Drug use: No     Allergies   Eggs or egg-derived products; Peanut-containing drug products; and Fish allergy   Review of Systems Review of Systems  Constitutional: Negative for chills and fever.  HENT: Positive for sore throat. Negative for congestion, dental problem, ear pain, rhinorrhea, sinus pressure and sinus pain.   Respiratory: Negative for cough.      Physical Exam Updated Vital Signs BP 110/61 (BP Location: Right Arm)   Pulse 81   Temp 98.7 F (37.1 C) (Oral)   Resp 16   Ht 5\' 1"  (1.549 m)   SpO2 100%   BMI 31.74 kg/m   Physical Exam  Constitutional: She appears well-developed and well-nourished. No distress.  HENT:  Head: Normocephalic and atraumatic.  Nose: Nose normal.  Mouth/Throat: Uvula is midline. Posterior oropharyngeal erythema present. Tonsils are 1+ on the right. Tonsils are 1+  on the left.  Bilaterally, symmetrically enlarged tonsils without exudates.  Patient does not appear to be in acute distress. No trismus or drooling present. No pooling of secretions. Patient is tolerating secretions and is not in respiratory distress. No neck pain or tenderness to palpation of the neck. Full active and passive range of motion of the neck. No evidence of RPA or PTA.  Eyes: Conjunctivae and EOM are normal. No scleral icterus.  Neck: Normal range of motion.  Pulmonary/Chest: Effort normal. No respiratory distress.  Lymphadenopathy:    She has cervical adenopathy.  Neurological: She is alert.  Skin: No rash noted. She is not diaphoretic.  Psychiatric: She has a normal mood and affect.  Nursing note and vitals reviewed.    ED Treatments / Results  Labs (all labs ordered are listed, but only  abnormal results are displayed) Labs Reviewed  GROUP A STREP BY PCR    EKG None  Radiology No results found.  Procedures Procedures (including critical care time)  Medications Ordered in ED Medications  acetaminophen (TYLENOL) tablet 650 mg (650 mg Oral Given 01/20/18 1009)     Initial Impression / Assessment and Plan / ED Course  I have reviewed the triage vital signs and the nursing notes.  Pertinent labs & imaging results that were available during my care of the patient were reviewed by me and considered in my medical decision making (see chart for details).     Pt rapid strep test negative. Pt is tolerating secretions, not in respiratory distress, no neck pain, no trismus. Presentation not concerning for peritonsillar abscess or spread of infection to deep spaces of the throat; patent airway. Pt will be discharged with lidocaine to swish and spit. Ibuprofen or Tylenol as needed for pain/fever. Specific return precautions discussed. Recommended PCP follow up. Pt appears safe for discharge.   Portions of this note were generated with Scientist, clinical (histocompatibility and immunogenetics). Dictation errors may occur despite best attempts at proofreading.  Final Clinical Impressions(s) / ED Diagnoses   Final diagnoses:  Viral pharyngitis    ED Discharge Orders         Ordered    lidocaine (XYLOCAINE) 2 % solution  As needed     01/20/18 1057           Dietrich Pates, PA-C 01/20/18 1058    Mancel Bale, MD 01/20/18 2147

## 2018-01-20 NOTE — ED Notes (Signed)
Pt reports throat pain x 1 week.  States worse when turning her head from side to side but not when swallowing.  Bila tonsils palpated.  Mild redness noted.

## 2018-01-20 NOTE — Discharge Instructions (Signed)
Your strep test was negative. Swish and spit lidocaine solution to help with throat discomfort.  Do not swallow. Return to ED for worsening symptoms, trouble breathing or trouble swallowing, drooling, trouble opening her mouth, chest pain.

## 2018-01-22 ENCOUNTER — Emergency Department (HOSPITAL_COMMUNITY)
Admission: EM | Admit: 2018-01-22 | Discharge: 2018-01-22 | Disposition: A | Discharge status: Home / Self Care | Payer: Medicaid Other | Attending: Emergency Medicine | Admitting: Emergency Medicine

## 2018-01-22 DIAGNOSIS — Z91010 Allergy to peanuts: Secondary | ICD-10-CM | POA: Insufficient documentation

## 2018-01-22 DIAGNOSIS — J069 Acute upper respiratory infection, unspecified: Secondary | ICD-10-CM | POA: Insufficient documentation

## 2018-01-22 DIAGNOSIS — Z79899 Other long term (current) drug therapy: Secondary | ICD-10-CM | POA: Insufficient documentation

## 2018-01-22 NOTE — ED Triage Notes (Signed)
Patient to ED c/o R ear pain since yesterday and continued and sore throat. Denies fevers/chills.

## 2018-01-22 NOTE — ED Provider Notes (Signed)
[facility] EMERGENCY DEPARTMENT Provider Note  CSN: [id number] Arrival date & time: 01/22/18  1108  History   Chief Complaint Chief Complaint  Patient presents with  . Otalgia  . Sore Throat   HPI April [name] is a 27 y.o. female with no significant medical history who presented for upper respiratory complaints. Denies recent sick contacts, travel or new exposures. Additional history obtained by medical chart. Patient seen in the ED on 01/20/18 for the same complaints and diagnosed with viral URI.   URI   Episode onset: 3 days ago. The problem has not changed since onset.There has been no fever. Associated symptoms include ear pain, rhinorrhea, sneezing and sore throat. Pertinent negatives include no abdominal pain, no diarrhea, no nausea, no vomiting, no congestion, no headaches, no plugged ear sensation, no sinus pain, no swollen glands, no neck pain, no cough, no rash and no wheezing. She has tried nothing for the symptoms.    Past Medical History:  Diagnosis Date  . Medical history non-contributory     Patient Active Problem List   Diagnosis Date Noted  . Chlamydia infection 01/15/2017  . Bacterial vaginosis 01/12/2017  . Screen for STD (sexually transmitted disease) 01/11/2017  . Anxiety and depression 01/05/2016  . Healthcare maintenance 01/05/2016  . Contraception management 11/02/2010    Past Surgical History:  Procedure Laterality Date  . NO PAST SURGERIES    . WISDOM TOOTH EXTRACTION  2013     OB History    Gravida  1   Para  1   Term  1   Preterm  0   AB  0   Living  1     SAB  0   TAB  0   Ectopic  0   Multiple  0   Live Births  1            Home Medications    Prior to Admission medications   Medication Sig Start Date End Date Taking? Authorizing Provider  lidocaine (XYLOCAINE) 2 % solution Use as directed 15 mLs in the mouth or throat as needed for mouth pain. 01/20/18   Khatri, Hina, PA-C  metroNIDAZOLE  (FLAGYL) 500 MG tablet Take 1 tablet (500 mg total) by mouth 2 (two) times daily. 01/15/17   Lennox Solders, MD  sertraline (ZOLOFT) 50 MG tablet Take 1 tablet (50 mg total) by mouth daily. 01/05/16   Araceli Bouche, DO    Family History Family History  Problem Relation Age of Onset  . Diabetes Maternal Aunt   . Diabetes Maternal Uncle   . Hypertension Maternal Uncle   . Diabetes Maternal Grandmother   . Hypertension Maternal Grandmother   . Cancer Maternal Grandfather   . Hearing loss Neg Hx     Social History Social History   Tobacco Use  . Smoking status: Never Smoker  . Smokeless tobacco: Never Used  Substance Use Topics  . Alcohol use: No  . Drug use: No     Allergies   Eggs or egg-derived products; Peanut-containing drug products; and Fish allergy   Review of Systems Review of Systems  Constitutional: Negative for chills and fever.  HENT: Positive for ear pain, postnasal drip, rhinorrhea, sneezing and sore throat. Negative for congestion and sinus pain.   Respiratory: Negative for cough and wheezing.   Gastrointestinal: Negative for abdominal pain, diarrhea, nausea and vomiting.  Musculoskeletal: Negative for neck pain.  Skin: Negative for rash.  Neurological: Negative for headaches.  Physical Exam Updated Vital Signs BP 115/65 (BP Location: Right Arm)   Pulse 99   Temp 98.7 F (37.1 C) (Oral)   Resp 17   SpO2 99%   Physical Exam  Constitutional: Vital signs are normal. She appears well-developed and well-nourished. She is cooperative. She does not appear ill.  HENT:  Head: Normocephalic and atraumatic.  Right Ear: Tympanic membrane, external ear and ear canal normal.  Left Ear: Tympanic membrane, external ear and ear canal normal.  Mouth/Throat: Uvula is midline, oropharynx is clear and moist and mucous membranes are normal. No oral lesions. No trismus in the jaw. No posterior oropharyngeal edema or posterior oropharyngeal erythema. No tonsillar  exudate.  Neck: Normal range of motion and full passive range of motion without pain. Neck supple.  Cardiovascular: Normal rate and regular rhythm.  No murmur heard. Pulmonary/Chest: Effort normal and breath sounds normal.  Lymphadenopathy:       Head (right side): No submental, no submandibular, no tonsillar, no preauricular, no posterior auricular and no occipital adenopathy present.       Head (left side): Tonsillar adenopathy present. No submental, no submandibular, no preauricular, no posterior auricular and no occipital adenopathy present.    She has no cervical adenopathy.  Neurological: She is alert.  Skin: Skin is warm and intact. Capillary refill takes less than 2 seconds. No rash noted.  Nursing note and vitals reviewed.  ED Treatments / Results  Labs (all labs ordered are listed, but only abnormal results are displayed) Labs Reviewed - No data to display  EKG None  Radiology No results found.  Procedures Procedures (including critical care time)  Medications Ordered in ED Medications - No data to display   Initial Impression / Assessment and Plan / ED Course  Triage vital signs and the nursing notes have been reviewed.  Pertinent labs & imaging results that were available during care of the patient were reviewed and considered in medical decision making (see chart for details).   Patient presents to the ED afebrile with upper respiratory complaints. Patient seen in the ED 2 days ago for the same issues, but has not tried any treatment or intervention since that time. Physical exam is unremarkable and there are no findings consistent with flu or a specific bacterial etiology for complaints such as strep pharyngitis, otitis media/externa, mastoiditis, etc that requires further testing or antibiotics.  Final Clinical Impressions(s) / ED Diagnoses  1. Viral URI. Education provided on OTC and supportive treatment for symptom relief. Thorough education provided on s/s  that would warrant return to medical provider.  Dispo: Home. After thorough clinical evaluation, this patient is determined to be medically stable and can be safely discharged with the previously mentioned treatment and/or outpatient follow-up/referral(s). At this time, there are no other apparent medical conditions that require further screening, evaluation or treatment.   Final diagnoses:  Viral upper respiratory tract infection    ED Discharge Orders    None        Windy Carina, [place] 01/22/18 1319    Cathren Laine, MD 01/22/18 1331

## 2018-01-22 NOTE — ED Notes (Signed)
Pt verbalized understanding of discharge instructions and denies any further questions at this time.   

## 2018-01-22 NOTE — Discharge Instructions (Signed)
Your symptoms are consistent with a viral upper respiratory infection. Please try over-the-counter cold and flu products to help with your symptoms. You should start to have improvement in symptoms in 3-5 days.  Follow-up if you have any of the following: difficulty swallowing liquids; difficulty moving your neck; difficulty breathing.  Get well soon.

## 2018-02-10 ENCOUNTER — Emergency Department (HOSPITAL_COMMUNITY)
Admission: EM | Admit: 2018-02-10 | Discharge: 2018-02-11 | Disposition: A | Discharge status: Home / Self Care | Payer: No Typology Code available for payment source | Attending: Emergency Medicine | Admitting: Emergency Medicine

## 2018-02-10 ENCOUNTER — Other Ambulatory Visit: Payer: Self-pay

## 2018-02-10 ENCOUNTER — Encounter (HOSPITAL_COMMUNITY): Payer: Self-pay | Admitting: Emergency Medicine

## 2018-02-10 DIAGNOSIS — Y999 Unspecified external cause status: Secondary | ICD-10-CM | POA: Diagnosis not present

## 2018-02-10 DIAGNOSIS — Y92410 Unspecified street and highway as the place of occurrence of the external cause: Secondary | ICD-10-CM | POA: Diagnosis not present

## 2018-02-10 DIAGNOSIS — K769 Liver disease, unspecified: Secondary | ICD-10-CM

## 2018-02-10 DIAGNOSIS — Y9389 Activity, other specified: Secondary | ICD-10-CM | POA: Insufficient documentation

## 2018-02-10 DIAGNOSIS — S299XXA Unspecified injury of thorax, initial encounter: Secondary | ICD-10-CM | POA: Diagnosis present

## 2018-02-10 DIAGNOSIS — S2002XA Contusion of left breast, initial encounter: Secondary | ICD-10-CM | POA: Insufficient documentation

## 2018-02-10 DIAGNOSIS — S2001XA Contusion of right breast, initial encounter: Secondary | ICD-10-CM | POA: Insufficient documentation

## 2018-02-10 LAB — URINALYSIS, ROUTINE W REFLEX MICROSCOPIC
Bilirubin Urine: NEGATIVE
Glucose, UA: NEGATIVE mg/dL
Hgb urine dipstick: NEGATIVE
Ketones, ur: NEGATIVE mg/dL
Leukocytes, UA: NEGATIVE
Nitrite: NEGATIVE
Protein, ur: NEGATIVE mg/dL
Specific Gravity, Urine: 1.029 (ref 1.005–1.030)
pH: 6 (ref 5.0–8.0)

## 2018-02-10 LAB — BASIC METABOLIC PANEL
Anion gap: 6 (ref 5–15)
BUN: 12 mg/dL (ref 6–20)
CO2: 26 mmol/L (ref 22–32)
Calcium: 9.1 mg/dL (ref 8.9–10.3)
Chloride: 107 mmol/L (ref 98–111)
Creatinine, Ser: 0.74 mg/dL (ref 0.44–1.00)
GFR calc Af Amer: >60 mL/min (ref >60)
GFR calc non Af Amer: >60 mL/min (ref >60)
Glucose, Bld: 100 mg/dL — ABNORMAL HIGH (ref 70–99)
Potassium: 3.9 mmol/L (ref 3.5–5.1)
Sodium: 139 mmol/L (ref 135–145)

## 2018-02-10 LAB — CBC
HCT: 37.2 % (ref 36.0–46.0)
Hemoglobin: 11.9 g/dL — ABNORMAL LOW (ref 12.0–15.0)
MCH: 27.9 pg (ref 26.0–34.0)
MCHC: 32 g/dL (ref 30.0–36.0)
MCV: 87.3 fL (ref 80.0–100.0)
Platelets: 400 10*3/uL (ref 150–400)
RBC: 4.26 MIL/uL (ref 3.87–5.11)
RDW: 13.1 % (ref 11.5–15.5)
WBC: 10.7 10*3/uL — ABNORMAL HIGH (ref 4.0–10.5)
nRBC: 0 % (ref 0.0–0.2)

## 2018-02-10 LAB — I-STAT BETA HCG BLOOD, ED (MC, WL, AP ONLY): I-stat hCG, quantitative: <5 m[IU]/mL (ref <5)
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
## 2018-02-11 ENCOUNTER — Emergency Department (HOSPITAL_COMMUNITY): Payer: No Typology Code available for payment source

## 2018-02-11 MED ORDER — IOHEXOL 300 MG/ML  SOLN
100.0000 mL | Freq: Once | INTRAMUSCULAR | Status: AC | PRN
  Administered 2018-02-11: 100 mL via INTRAVENOUS

## 2018-02-11 MED ORDER — ACETAMINOPHEN 500 MG PO TABS
1000.0000 mg | ORAL_TABLET | Freq: Once | ORAL | Status: AC
  Administered 2018-02-11: 1000 mg via ORAL
  Filled 2018-02-11: qty 2

## 2018-02-11 NOTE — Discharge Instructions (Addendum)
1. Medications: tylenol for pain, usual home medications 2. Treatment: rest, drink plenty of fluids, ice for contusions 3. Follow Up: Please followup with your primary doctor in 2-3 days for discussion of your diagnoses and further evaluation after today's visit; if you do not have a primary care doctor use the resource guide provided to find one; Please return to the ER for worsening pain, difficulty breathing, significant swelling of the breast, or other concerns

## 2018-02-11 NOTE — ED Notes (Signed)
To discharge after Dahlia Client PA speaks with the patient.

## 2018-04-05 ENCOUNTER — Encounter (HOSPITAL_COMMUNITY): Payer: Self-pay | Admitting: Emergency Medicine

## 2018-04-05 ENCOUNTER — Emergency Department (HOSPITAL_COMMUNITY)
Admission: EM | Admit: 2018-04-05 | Discharge: 2018-04-06 | Disposition: A | Discharge status: Home / Self Care | Payer: Medicaid Other | Attending: Emergency Medicine | Admitting: Emergency Medicine

## 2018-04-05 DIAGNOSIS — R509 Fever, unspecified: Secondary | ICD-10-CM | POA: Insufficient documentation

## 2018-04-05 DIAGNOSIS — J029 Acute pharyngitis, unspecified: Secondary | ICD-10-CM

## 2018-04-05 DIAGNOSIS — R0981 Nasal congestion: Secondary | ICD-10-CM | POA: Insufficient documentation

## 2018-04-05 LAB — GROUP A STREP BY PCR: GROUP A STREP BY PCR: NOT DETECTED

## 2018-04-05 MED ORDER — PENICILLIN G BENZATHINE 1200000 UNIT/2ML IM SUSP
1.2000 10*6.[IU] | Freq: Once | INTRAMUSCULAR | Status: AC
  Administered 2018-04-06: 1.2 10*6.[IU] via INTRAMUSCULAR
  Filled 2018-04-05: qty 2

## 2018-04-05 NOTE — ED Provider Notes (Signed)
[facility] Provider Note   CSN: [id number] Arrival date & time: 04/05/18  2026     History   Chief Complaint Chief Complaint  Patient presents with  . Sore Throat    HPI April [name] is a 27 y.o. female.  The history is provided by the patient and medical records.  Sore Throat      27 year old female presenting to the ED with sore throat.  States she had a cold last week but that overall is getting better.  Reports over the past 2 days she has had worsening sore throat and feels like there is some swelling.  States it is starting to become extremely painful when trying to eat or drink.  She has been a little bit nauseated but no vomiting.  Does report some low-grade fevers at home.  She does report having strep throat a few months ago and this feels very similar.  She is not had any chest pain or shortness of breath.  She has been taking some over-the-counter cold medicine for her nasal congestion and other symptoms which seems to be helping with that but not really with her throat.  Past Medical History:  Diagnosis Date  . Medical history non-contributory     Patient Active Problem List   Diagnosis Date Noted  . Chlamydia infection 01/15/2017  . Bacterial vaginosis 01/12/2017  . Screen for STD (sexually transmitted disease) 01/11/2017  . Anxiety and depression 01/05/2016  . Healthcare maintenance 01/05/2016  . Contraception management 11/02/2010    Past Surgical History:  Procedure Laterality Date  . NO PAST SURGERIES    . WISDOM TOOTH EXTRACTION  2013     OB History    Gravida  1   Para  1   Term  1   Preterm  0   AB  0   Living  1     SAB  0   TAB  0   Ectopic  0   Multiple  0   Live Births  1            Home Medications    Prior to Admission medications   Medication Sig Start Date End Date Taking? Authorizing Provider  lidocaine (XYLOCAINE) 2 % solution Use as directed 15 mLs in the mouth  or throat as needed for mouth pain. Patient not taking: Reported on 02/10/2018 01/20/18   Dietrich PatesKhatri, Hina, PA-C  metroNIDAZOLE (FLAGYL) 500 MG tablet Take 1 tablet (500 mg total) by mouth 2 (two) times daily. Patient not taking: Reported on 02/10/2018 01/15/17   Lennox SoldersWinfrey, Amanda C, MD  sertraline (ZOLOFT) 50 MG tablet Take 1 tablet (50 mg total) by mouth daily. Patient not taking: Reported on 02/10/2018 01/05/16   Araceli Boucheumley, Ester N, DO    Family History Family History  Problem Relation Age of Onset  . Diabetes Maternal Aunt   . Diabetes Maternal Uncle   . Hypertension Maternal Uncle   . Diabetes Maternal Grandmother   . Hypertension Maternal Grandmother   . Cancer Maternal Grandfather   . Hearing loss Neg Hx     Social History Social History   Tobacco Use  . Smoking status: Never Smoker  . Smokeless tobacco: Never Used  Substance Use Topics  . Alcohol use: No  . Drug use: No     Allergies   Eggs or egg-derived products; Peanut-containing drug products; and Fish allergy   Review of Systems Review of Systems  Constitutional: Positive for fever.  HENT: Positive for sore throat.   All other systems reviewed and are negative.    Physical Exam Updated Vital Signs BP 127/81 (BP Location: Right Arm)   Pulse (!) 106   Temp 99.1 F (37.3 C) (Oral)   Resp 18   Ht 5\' 1"  (1.549 m)   Wt 77.1 kg   SpO2 100%   BMI 32.12 kg/m   Physical Exam Vitals signs and nursing note reviewed.  Constitutional:      Appearance: She is well-developed.  HENT:     Head: Normocephalic and atraumatic.     Comments: Tonsils 2+ bilaterally with exudates present; uvula midline without evidence of peritonsillar abscess; handling secretions appropriately; no difficulty swallowing or speaking; normal phonation without stridor    Right Ear: Tympanic membrane and ear canal normal.     Left Ear: Tympanic membrane and ear canal normal.     Nose: Congestion (mild) present.  Eyes:     Conjunctiva/sclera:  Conjunctivae normal.     Pupils: Pupils are equal, round, and reactive to light.  Neck:     Musculoskeletal: Normal range of motion.  Cardiovascular:     Rate and Rhythm: Normal rate and regular rhythm.     Heart sounds: Normal heart sounds.  Pulmonary:     Effort: Pulmonary effort is normal.     Breath sounds: Normal breath sounds.  Abdominal:     General: Bowel sounds are normal.     Palpations: Abdomen is soft.  Musculoskeletal: Normal range of motion.  Lymphadenopathy:     Head:     Right side of head: Tonsillar adenopathy present.     Left side of head: Tonsillar adenopathy present.  Skin:    General: Skin is warm and dry.  Neurological:     Mental Status: She is alert and oriented to person, place, and time.      ED Treatments / Results  Labs (all labs ordered are listed, but only abnormal results are displayed) Labs Reviewed  GROUP A STREP BY PCR    EKG None  Radiology No results found.  Procedures Procedures (including critical care time)  Medications Ordered in ED Medications  penicillin g benzathine (BICILLIN LA) 1200000 UNIT/2ML injection 1.2 Million Units (has no administration in time range)     Initial Impression / Assessment and Plan / ED Course  I have reviewed the triage vital signs and the nursing notes.  Pertinent labs & imaging results that were available during my care of the patient were reviewed by me and considered in my medical decision making (see chart for details).  27 year old female here with sore throat.  Over the past week which is improving but sore throat is worsening.  She has a low-grade fever here but is nontoxic in appearance.  She does have tonsillar edema bilaterally with exudates.  Uvula remains midline without any signs of peritonsillar abscess.  She is handling secretions well, normal phonation without stridor.  Does have some lymphadenopathy bilaterally as well which is likely reactive.  Rapid strep was sent and is  negative, however symptoms are concerning for strep pharyngitis so we will treat with Bicillin here.  Rx viscous lidocaine at home for comfort.  Can continue Tylenol/Motrin for fever.  Follow-up with PCP.  Return here for any new/acute changes.  Final Clinical Impressions(s) / ED Diagnoses   Final diagnoses:  Sore throat    ED Discharge Orders         Ordered    lidocaine (XYLOCAINE) 2 %  solution  As needed     04/06/18 0012           Garlon Hatchet, PA-C 04/06/18 0014    Tilden Fossa, MD 04/06/18 [phone]

## 2018-04-05 NOTE — ED Triage Notes (Signed)
Reports having sore throat since yesterday. States I think I have tonsillitis again.  Also endorses having a head cold for about a week.

## 2018-04-05 NOTE — ED Notes (Signed)
Called for Pt to take to RM. No answer 

## 2018-04-06 MED ORDER — LIDOCAINE VISCOUS HCL 2 % MT SOLN: 15.0000 mL | OROMUCOSAL | 0 refills | Status: DC | PRN

## 2018-04-06 NOTE — Discharge Instructions (Signed)
Take the prescribed medication as directed.  Can use tylenol/motrin for fever. Follow-up with your primary care doctor. Return to the ED for new or worsening symptoms.
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
## 2018-04-15 ENCOUNTER — Other Ambulatory Visit (HOSPITAL_COMMUNITY)
Admission: RE | Admit: 2018-04-15 | Discharge: 2018-04-15 | Disposition: A | Discharge status: Home / Self Care | Payer: Medicaid Other | Source: Ambulatory Visit | Attending: Family Medicine | Admitting: Family Medicine

## 2018-04-15 ENCOUNTER — Encounter: Payer: Self-pay | Admitting: Student in an Organized Health Care Education/Training Program

## 2018-04-15 ENCOUNTER — Ambulatory Visit (INDEPENDENT_AMBULATORY_CARE_PROVIDER_SITE_OTHER): Payer: Self-pay | Admitting: Student in an Organized Health Care Education/Training Program

## 2018-04-15 ENCOUNTER — Other Ambulatory Visit: Payer: Self-pay

## 2018-04-15 VITALS — BP 100/62 | HR 80 | Temp 98.9°F | Ht 61.0 in | Wt 169.2 lb

## 2018-04-15 DIAGNOSIS — Z113 Encounter for screening for infections with a predominantly sexual mode of transmission: Secondary | ICD-10-CM

## 2018-04-15 DIAGNOSIS — J028 Acute pharyngitis due to other specified organisms: Secondary | ICD-10-CM

## 2018-04-15 DIAGNOSIS — N898 Other specified noninflammatory disorders of vagina: Secondary | ICD-10-CM | POA: Insufficient documentation

## 2018-04-15 DIAGNOSIS — B9789 Other viral agents as the cause of diseases classified elsewhere: Secondary | ICD-10-CM

## 2018-04-15 LAB — POCT WET PREP (WET MOUNT)
Clue Cells Wet Prep Whiff POC: NEGATIVE
Trichomonas Wet Prep HPF POC: ABSENT

## 2018-04-15 NOTE — Patient Instructions (Signed)
It was a pleasure seeing you today in our clinic. Here is the treatment plan we have discussed and agreed upon together:  You do not have a yeast infection.  We drew blood work and swabs at today's visit. I will call or send you a letter with these results. If you do not hear from me within the next week, please give our office a call.  Our clinic's number is [phone]. Please call with questions or concerns about what we discussed today.  Be well, Dr. Mosetta Putt

## 2018-04-15 NOTE — Progress Notes (Signed)
Subjective:    April [name] - 28 y.o. female MRN [id number]  Date of birth: 1990/06/10  HPI  April [name] is here for sore throat and vaginal discharge.  Sore Throat Patient endorses Sore throat that has been going on for 1-2 weeks. Initially sore throat was painful on the left. She went to the ED where strep test was negative but she was diagnosed with strep pharyngitis based on clinical picture and treated with an PCN injection. Subsequently, the sore throat began to bother her on the right side of her throat. She has not noticed any abscess or mass. No difficulty breathing, no wheezing. She has been coughing and has had post nasal drip. She has had rhinorrhea. No fevers. No sick contacts.  Vaginal Discharge patient reports that she had 1-2 days of vaginal discharge prior to making this appointment. The discharge is described as dark and clumpy. She then had some pruritis and some dyspareunia. She then started menstruating and her symptoms resolved. She said that symptoms were different than previous yeast infection symptoms. No dysuria. No hematuria. No urinary frequency or urgency. No N/V/D/C or fevers. She would like to be checked despite resolution of symptoms and she would like STI testing today.   Health Maintenance:  There are no preventive care reminders to display for this patient.  -  reports that she has never smoked. She has never used smokeless tobacco. - Review of Systems: Per HPI. - Past Medical History: Patient Active Problem List   Diagnosis Date Noted  . Vaginal discharge 04/15/2018  . Chlamydia infection 01/15/2017  . Bacterial vaginosis 01/12/2017  . Screen for STD (sexually transmitted disease) 01/11/2017  . Anxiety and depression 01/05/2016  . Healthcare maintenance 01/05/2016  . Contraception management 11/02/2010   - Medications: reviewed and updated Current Outpatient Medications  Medication Sig Dispense Refill  . lidocaine (XYLOCAINE) 2 %  solution Use as directed 15 mLs in the mouth or throat as needed for mouth pain. 150 mL 0  . metroNIDAZOLE (FLAGYL) 500 MG tablet Take 1 tablet (500 mg total) by mouth 2 (two) times daily. (Patient not taking: Reported on 02/10/2018) 14 tablet 0  . sertraline (ZOLOFT) 50 MG tablet Take 1 tablet (50 mg total) by mouth daily. (Patient not taking: Reported on 02/10/2018) 60 tablet 0   No current facility-administered medications for this visit.     Review of Systems See HPI     Objective:   Physical Exam BP 100/62   Pulse 80   Temp 98.9 F (37.2 C) (Oral)   Ht 5\' 1"  (1.549 m)   Wt 169 lb 3.2 oz (76.7 kg)   SpO2 98%   BMI 31.97 kg/m  Gen: NAD, alert, cooperative with exam, well-appearing  HEENT: NCAT, PERRL, clear conjunctiva, oropharynx clear, enlarged tonsils but no erythema or exudate, supple neck CV: RRR, good S1/S2, no murmur, no edema, capillary refill brisk  Resp: CTABL, no wheezes, non-labored Abd: SNTND, BS present, no guarding or organomegaly Skin: no rashes, normal turgor  Neuro: no gross deficits.  Psych: good insight, alert and oriented GU: Menstrual blood from cervical os, mucousy discharge. normal external genitalia, vulva, vagina, cervix, uterus and adnexa    Assessment & Plan:   1.Vaginal DIscharge - Symptoms had resolved by the time of her appointment. Wet prep, GC/chla, RPR, HIV were all negative in the office today. Patient to return to care if symptoms return.  2. Sore throat - likely viral with constellation of symptoms including cough,  congestion, and throat pain. No discrete masses or vocal changes to suggest abscess. Continue conservative management and OTC remedies. Return precautions advised.  Howard Pouch, MD,MS,  PGY3 04/17/2018 6:04 AM

## 2018-04-15 NOTE — Assessment & Plan Note (Signed)
Hx of STI noted on chart review. Vaginal symptoms have resolved, however wet prep, GC, chlamydia, HIV and RPR completed today. - wet prep not indicative of yeast infection - will follow up next results

## 2018-04-16 ENCOUNTER — Encounter: Payer: Self-pay | Admitting: Student in an Organized Health Care Education/Training Program

## 2018-04-16 LAB — CERVICOVAGINAL ANCILLARY ONLY
Chlamydia: NEGATIVE
Neisseria Gonorrhea: NEGATIVE

## 2018-04-16 LAB — RPR: RPR Ser Ql: NONREACTIVE

## 2018-04-16 LAB — HIV ANTIBODY (ROUTINE TESTING W REFLEX): HIV SCREEN 4TH GENERATION: NONREACTIVE

## 2018-04-17 ENCOUNTER — Encounter: Payer: Self-pay | Admitting: Student in an Organized Health Care Education/Training Program

## 2018-07-08 ENCOUNTER — Other Ambulatory Visit: Payer: Self-pay

## 2018-07-08 ENCOUNTER — Telehealth (INDEPENDENT_AMBULATORY_CARE_PROVIDER_SITE_OTHER): Payer: Self-pay | Admitting: Family Medicine

## 2018-07-08 DIAGNOSIS — N926 Irregular menstruation, unspecified: Secondary | ICD-10-CM | POA: Insufficient documentation

## 2018-07-08 NOTE — Progress Notes (Signed)
[facility] Telemedicine Visit  Patient consented to have visit conducted via telephone.  Encounter participants: Patient: April [name]  Provider: Solmon Ice Meccariello  Others (if applicable): Dr. Deirdre Priest  Chief Complaint: "I think I am pregnant"  HPI: Patient calling today with concerns that she is pregnant.  She states that for the last month she has been feeling very sleepy.  She also states that she has been eating more, and feeling very bloated.  She denies any abdominal pain, but did state that she had about 1 day of cramping in the last month.  She states that she has felt somewhat nauseated over the last month, but had 1 day of breast tenderness.  She states that she has been pregnant before, and did not have the same symptoms.  She has been having unprotected sex periodically throughout the last month and before.  She states that her most recent sexual encounter was 2 days ago.  She states that prior to this month she had been having regular periods, but in this last month she has been having some spotting only.  Patient has a Nexplanon which was placed in February 2019.  She states that she can still feel that it is in place.  She took a pregnancy test last night and the night before, and both were negative.  She states "I have a friend who got pregnant while she was on the Nexplanon and she had 2- pregnancy tests at the same time.  I am just concerned that I am pregnant 2."  ROS: As noted in HPI  Pertinent PMHx: History of anxiety and depression  Physical Exam: Patient speaking in complete sentences and not on distress over the phone  Assessment/Plan:  Irregular menstrual cycle Given the patient's Nexplanon was placed 1 year ago, it would be very unlikely that she is pregnant.  Doubt ectopic pregnancy given that she has not had abdominal pain aside from 1 day of cramping.  Suspect that the irregularity in her menstrual cycle is likely just from  Nexplanon, as this can cause irregular bleeding.  Advised patient to obtain another pregnancy test and that the best time to test is first thing in the morning with first urination.  She continues to have symptoms, she should check again in 1 week.  At that time if she is continues to have symptoms, she should call.  Patient advised if pregnancy test positive to call immediately.    Time spent on phone with patient: 8 minutes

## 2018-07-08 NOTE — Assessment & Plan Note (Signed)
Given the patient's Nexplanon was placed 1 year ago, it would be very unlikely that she is pregnant.  Doubt ectopic pregnancy given that she has not had abdominal pain aside from 1 day of cramping.  Suspect that the irregularity in her menstrual cycle is likely just from Nexplanon, as this can cause irregular bleeding.  Advised patient to obtain another pregnancy test and that the best time to test is first thing in the morning with first urination.  She continues to have symptoms, she should check again in 1 week.  At that time if she is continues to have symptoms, she should call.  Patient advised if pregnancy test positive to call immediately.

## 2018-08-21 ENCOUNTER — Ambulatory Visit: Payer: Medicaid Other

## 2018-08-21 ENCOUNTER — Other Ambulatory Visit (HOSPITAL_COMMUNITY)
Admission: RE | Admit: 2018-08-21 | Discharge: 2018-08-21 | Disposition: A | Discharge status: Home / Self Care | Payer: Medicaid Other | Source: Ambulatory Visit | Attending: Family Medicine | Admitting: Family Medicine

## 2018-08-21 ENCOUNTER — Other Ambulatory Visit: Payer: Self-pay

## 2018-08-21 ENCOUNTER — Ambulatory Visit (INDEPENDENT_AMBULATORY_CARE_PROVIDER_SITE_OTHER): Payer: Self-pay | Admitting: Family Medicine

## 2018-08-21 VITALS — BP 102/62 | HR 109

## 2018-08-21 DIAGNOSIS — Z202 Contact with and (suspected) exposure to infections with a predominantly sexual mode of transmission: Secondary | ICD-10-CM | POA: Insufficient documentation

## 2018-08-21 LAB — POCT WET PREP (WET MOUNT)
Clue Cells Wet Prep Whiff POC: NEGATIVE
Trichomonas Wet Prep HPF POC: ABSENT

## 2018-08-21 NOTE — Progress Notes (Signed)
CER

## 2018-08-21 NOTE — Progress Notes (Signed)
   Subjective:    Patient ID: April [name], female    DOB: 1990/11/17, 28 y.o.   MRN: [id number]   CC: trich exposure  HPI: boyfriend was seen yesterday and told he has trich, he told patient to get checked. She denies vaginal discharge, pelvic pain, nausea, vomiting, fevers. She had negative HIV/RPR in January and does not want to be retested for these. She does want to get checked for gonorrhea/chlamydia as well. She reports she was last sexually active with boyfriend 2 weeks ago.   Smoking status reviewed- non-smoker  Review of Systems- see HPI   Objective:  BP 102/62   Pulse (!) 109   LMP 07/10/2018   SpO2 98%  Vitals and nursing note reviewed  General: well nourished, in no acute distress HEENT: normocephalic, MMM Cardiac: regular rate Respiratory: no increased work of breathing Abdomen: soft, nontender GU: normal external female genitalia, cervix pink without lesions, no CMT Extremities: no edema or cyanosis. Neuro: alert and oriented, no focal deficits   Assessment & Plan:    1. Possible exposure to STD Wet prep negative for trichomonas which boyfriend was treated for yesterday, they have not been intimate for the past 2 weeks. I discussed with her what Ivery Quale is and how it is transmitted. Advised she avoid intercourse with him for a week after he was treated. Gc/chlamydia sent to lab.  - Cervicovaginal ancillary only - POCT Wet Prep [employer])   Return as needed.   Dolores Patty, DO Family Medicine Resident PGY-3

## 2018-08-21 NOTE — Patient Instructions (Addendum)
  Your vaginal swab was negative today. We sent out gonorrhea/chlamydia testing to the lab and that will come back in 1-2 days. We will call you with those results.  If you have questions or concerns please do not hesitate to call at [phone].  Dolores Patty, DO PGY-3, [facility] 08/21/2018 3:32 PM  Cervicitis  Cervicitis is when the cervix gets irritated and swollen. Your cervix is the lower end of your uterus. Follow these instructions at home:  Do not have sex until your doctor says it is okay.  Take over-the-counter and prescription medicines only as told by your doctor.  If you were prescribed an antibiotic medicine, take it as told by your doctor. Do not stop taking it even if you start to feel better.  Keep all follow-up visits as told by your doctor. This is important. Contact a doctor if:  Your symptoms come back after treatment.  Your symptoms get worse after treatment.  You have a fever.  You feel tired (fatigued).  Your belly (abdomen) hurts.  You feel like you are going to throw up (are nauseous).  You throw up (vomit).  You have watery poop (diarrhea).  Your back hurts. Get help right away if:  You have very bad pain in your belly, and medicine does not help it.  You cannot pee (urinate). Summary  Cervicitis is when the cervix gets irritated and swollen.  Do not have sex until your doctor says it is okay.  If you need to take an antibiotic, do not stop taking even if you start to feel better. Take medicines only as told by your doctor. This information is not intended to replace advice given to you by your health care provider. Make sure you discuss any questions you have with your health care provider. Document Released: 01/04/2008 Document Revised: 12/12/2015 Document Reviewed: 12/12/2015 Elsevier Interactive Patient Education  2019 [employer].

## 2018-08-22 LAB — CERVICOVAGINAL ANCILLARY ONLY
Chlamydia: NEGATIVE
Neisseria Gonorrhea: NEGATIVE

## 2019-03-10 ENCOUNTER — Telehealth: Payer: Self-pay | Admitting: Family Medicine

## 2019-03-10 NOTE — Telephone Encounter (Signed)
Pt informed and very appreciative April [name], CMA   

## 2019-03-10 NOTE — Telephone Encounter (Signed)
Patient would like to know if her sister can come with her in for the appointment for support during her appointment tomorrow?  Please let her know, [phone].

## 2019-03-10 NOTE — Telephone Encounter (Signed)
Yes, you can let her know that she can bring her sister.  Thanks.

## 2019-03-11 ENCOUNTER — Ambulatory Visit (INDEPENDENT_AMBULATORY_CARE_PROVIDER_SITE_OTHER): Payer: Medicaid Other | Admitting: Family Medicine

## 2019-03-11 ENCOUNTER — Encounter: Payer: Self-pay | Admitting: Family Medicine

## 2019-03-11 ENCOUNTER — Other Ambulatory Visit (HOSPITAL_COMMUNITY)
Admission: RE | Admit: 2019-03-11 | Discharge: 2019-03-11 | Disposition: A | Discharge status: Home / Self Care | Payer: Medicaid Other | Source: Ambulatory Visit | Attending: Family Medicine | Admitting: Family Medicine

## 2019-03-11 ENCOUNTER — Other Ambulatory Visit: Payer: Self-pay

## 2019-03-11 VITALS — BP 126/68 | HR 94 | Wt 187.4 lb

## 2019-03-11 DIAGNOSIS — Z0000 Encounter for general adult medical examination without abnormal findings: Secondary | ICD-10-CM | POA: Diagnosis not present

## 2019-03-11 DIAGNOSIS — Z3049 Encounter for surveillance of other contraceptives: Secondary | ICD-10-CM

## 2019-03-11 DIAGNOSIS — Z309 Encounter for contraceptive management, unspecified: Secondary | ICD-10-CM | POA: Diagnosis not present

## 2019-03-11 NOTE — Progress Notes (Signed)
   Subjective:    April [name] - 28 y.o. female MRN [id number]  Date of birth: Sep 03, 1990  CC:  April [name] is here for a pap smear and Nexplanon removal.  HPI: April [name] says that she would like her Nexplanon to come out because she wants to be "natural."  She says that she has tolerated the Nexplanon well except for occasional mood swings which she attributes to this medication.  She would like to use condoms for birth control.  She does not plan to become pregnant soon but would be okay if she did become pregnant and feels like she has adequate support for pregnancy.  She is also due for her Pap smear today.  She declines STI testing.  Health Maintenance:  Health Maintenance Due  Topic Date Due  . INFLUENZA VACCINE  11/09/2018  . PAP-Cervical Cytology Screening  01/05/2019  . PAP SMEAR-Modifier  01/05/2019    -  reports that she has never smoked. She has never used smokeless tobacco. - Review of Systems: Per HPI. - Past Medical History: Patient Active Problem List   Diagnosis Date Noted  . Irregular menstrual cycle 07/08/2018  . Bacterial vaginosis 01/12/2017  . Screen for STD (sexually transmitted disease) 01/11/2017  . Anxiety and depression 01/05/2016  . Healthcare maintenance 01/05/2016  . Contraception management 11/02/2010   - Medications: reviewed and updated   Objective:   Physical Exam BP 126/68   Pulse 94   Wt 187 lb 6.4 oz (85 kg)   LMP 02/18/2019 (Approximate)   SpO2 98%   BMI 35.41 kg/m  Gen: NAD, alert, cooperative with exam, well-appearing Skin: nexplanon palpated in L arm Psych: good insight, alert and oriented     Assessment & Plan:   Contraception management Patient was counseled that the copper IUD is a nonhormonal, very effective version of birth control if she is interested in this in the future.  Also counseled the patient that she is likely to become pregnant if she is not on any birth control and that she should consider starting  to take prenatal vitamins.  Encouraged her to return if she is interested in any form of birth control.  Healthcare maintenance Pap smear performed today.  PROCEDURE NOTE: [place] Patient given informed consent and signed copy in the chart. L arm area prepped and draped in the usual sterile fashion. Three cc of lidocaine without epinephrine 1% used for local anesthesia. A small stab incision was made close to the nexplanon with scalpel. Hemostats were used to withdraw the nexplanon. A small bandage was applied over a steri strip  No complications. Patient given follow up instructions should she experience redness, swelling at sight or fever in the next 24 hours. Patient was reminded this totally removes her nexplanon contraceptive devise. (she can now potentially conceive)   April [name], M.D. 03/11/2019, 4:53 PM PGY-3, [place]

## 2019-03-11 NOTE — Patient Instructions (Signed)
It was nice seeing you today April [name]!  Today, we performed a Pap smear and removed your Nexplanon.  We will let you know what the results of Pap smear are once they return.  You may experience some bruising and tenderness in the area where your Nexplanon was removed, although this should be mild and should improve in the next day or 2.  Please keep the bandage on for about 24 hours to reduce bruising.  If you do experience any fevers, chills, or increased redness in the area, please come in to be seen.  If you have any questions or concerns, please feel free to call the clinic.   Be well,  Dr. Shan Levans

## 2019-03-11 NOTE — Assessment & Plan Note (Signed)
Patient was counseled that the copper IUD is a nonhormonal, very effective version of birth control if she is interested in this in the future.  Also counseled the patient that she is likely to become pregnant if she is not on any birth control and that she should consider starting to take prenatal vitamins.  Encouraged her to return if she is interested in any form of birth control.

## 2019-03-11 NOTE — Assessment & Plan Note (Signed)
Pap smear performed today. 

## 2019-03-13 LAB — CYTOLOGY - PAP: Diagnosis: NEGATIVE

## 2019-04-17 ENCOUNTER — Other Ambulatory Visit: Payer: Self-pay

## 2019-04-17 ENCOUNTER — Ambulatory Visit (INDEPENDENT_AMBULATORY_CARE_PROVIDER_SITE_OTHER): Payer: Self-pay | Admitting: Family Medicine

## 2019-04-17 VITALS — BP 132/80 | HR 101 | Wt 187.4 lb

## 2019-04-17 DIAGNOSIS — N898 Other specified noninflammatory disorders of vagina: Secondary | ICD-10-CM

## 2019-04-17 DIAGNOSIS — B9689 Other specified bacterial agents as the cause of diseases classified elsewhere: Secondary | ICD-10-CM

## 2019-04-17 DIAGNOSIS — N760 Acute vaginitis: Secondary | ICD-10-CM

## 2019-04-17 DIAGNOSIS — Z3200 Encounter for pregnancy test, result unknown: Secondary | ICD-10-CM

## 2019-04-17 LAB — POCT WET PREP (WET MOUNT)
Clue Cells Wet Prep Whiff POC: POSITIVE
Trichomonas Wet Prep HPF POC: ABSENT

## 2019-04-17 LAB — POCT URINE PREGNANCY: Preg Test, Ur: NEGATIVE

## 2019-04-17 MED ORDER — METRONIDAZOLE 500 MG PO TABS: 500.0000 mg | ORAL_TABLET | Freq: Two times a day (BID) | ORAL | 0 refills | Status: AC

## 2019-04-17 NOTE — Progress Notes (Signed)
   Subjective:    Patient ID: April [name], female    DOB: 10/04/1990, 29 y.o.   MRN: [id number]   WV:[id number] discharge  HPI: VAGINAL DISCHARGE  Onset: 2 weeks   Description: milky   Odor: none   Itching: none   Symptoms Dysuria: yes  Bleeding: yes  Pelvic pain: yes  Back pain: no  Fever: no  Genital sores: no  Rash: no  Dyspareunia: no  GI Sxs: yes  Prior treatment: no   Red Flags: Missed period: yes  Pregnancy: yes  Recent antibiotics: no  Sexual activity: yes, unprotected intercourse 04/14/19  Possible STD exposure: no  IUD: no  Diabetes: no   States that she has been off her Nexplanon since December 1.  States that she has had more cramping and bloating for the past 2 weeks.  Did vomit last week.  Is worried she is pregnant.   Objective:  BP 132/80   Pulse (!) 101   Wt 187 lb 6.4 oz (85 kg)   SpO2 98%   BMI 35.41 kg/m  Vitals and nursing note reviewed  General: well nourished, in no acute distress HEENT: normocephalic Neck: supple Respiratory: no increased work of breathing Extremities: no edema or cyanosis. Warm, well perfused.  Skin: warm and dry, no rashes noted Neuro: alert and oriented, no focal deficits Female genitalia: normal external genitalia, vulva, vagina, cervix, uterus and adnexa, thin discharge noted around cevix   Assessment & Plan:    Bacterial vaginosis Wet prep consistent with bacterial vaginosis.  Will give Rx for metronidazole.  Patient refused STD testing at this time.  I did advise her to start taking a prenatal vitamin if she is considering getting pregnant soon.    Return in about 2 weeks (around 05/01/2019), or if symptoms worsen or fail to improve.   Oralia Manis, DO, PGY-3

## 2019-04-17 NOTE — Patient Instructions (Signed)
Bacterial Vaginosis  Bacterial vaginosis is an infection of the vagina. It happens when too many normal germs (healthy bacteria) grow in the vagina. This infection puts you at risk for infections from sex (STIs). Treating this infection can lower your risk for some STIs. You should also treat this if you are pregnant. It can cause your baby to be born early. Follow these instructions at home: Medicines  Take over-the-counter and prescription medicines only as told by your doctor.  Take or use your antibiotic medicine as told by your doctor. Do not stop taking or using it even if you start to feel better. General instructions  If you your sexual partner is a woman, tell her that you have this infection. She needs to get treatment if she has symptoms. If you have a female partner, he does not need to be treated.  During treatment: ? Avoid sex. ? Do not douche. ? Avoid alcohol as told. ? Avoid breastfeeding as told.  Drink enough fluid to keep your pee (urine) clear or pale yellow.  Keep your vagina and butt (rectum) clean. ? Wash the area with warm water every day. ? Wipe from front to back after you use the toilet.  Keep all follow-up visits as told by your doctor. This is important. Preventing this condition  Do not douche.  Use only warm water to wash around your vagina.  Use protection when you have sex. This includes: ? Latex condoms. ? Dental dams.  Limit how many people you have sex with. It is best to only have sex with the same person (be monogamous).  Get tested for STIs. Have your partner get tested.  Wear underwear that is cotton or lined with cotton.  Avoid tight pants and pantyhose. This is most important in summer.  Do not use any products that have nicotine or tobacco in them. These include cigarettes and e-cigarettes. If you need help quitting, ask your doctor.  Do not use illegal drugs.  Limit how much alcohol you drink. Contact a doctor if:  Your  symptoms do not get better, even after you are treated.  You have more discharge or pain when you pee (urinate).  You have a fever.  You have pain in your belly (abdomen).  You have pain with sex.  Your bleed from your vagina between periods. Summary  This infection happens when too many germs (bacteria) grow in the vagina.  Treating this condition can lower your risk for some infections from sex (STIs).  You should also treat this if you are pregnant. It can cause early (premature) birth.  Do not stop taking or using your antibiotic medicine even if you start to feel better. This information is not intended to replace advice given to you by your health care provider. Make sure you discuss any questions you have with your health care provider. Document Revised: 03/09/2017 Document Reviewed: 12/11/2015 Elsevier Patient Education  [employer].  Please take a prenatal vitamin if you are trying to get pregnant

## 2019-04-18 NOTE — Assessment & Plan Note (Addendum)
Wet prep consistent with bacterial vaginosis.  Will give Rx for metronidazole.  Patient refused STD testing at this time. Pregnancy test neg.  I did advise her to start taking a prenatal vitamin if she is considering getting pregnant soon.

## 2019-05-07 ENCOUNTER — Other Ambulatory Visit: Payer: Self-pay

## 2019-05-07 ENCOUNTER — Other Ambulatory Visit (HOSPITAL_COMMUNITY)
Admission: RE | Admit: 2019-05-07 | Discharge: 2019-05-07 | Disposition: A | Discharge status: Home / Self Care | Payer: Medicaid Other | Source: Ambulatory Visit | Attending: Family Medicine | Admitting: Family Medicine

## 2019-05-07 ENCOUNTER — Encounter: Payer: Self-pay | Admitting: Family Medicine

## 2019-05-07 ENCOUNTER — Ambulatory Visit (INDEPENDENT_AMBULATORY_CARE_PROVIDER_SITE_OTHER): Payer: Medicaid Other | Admitting: Family Medicine

## 2019-05-07 VITALS — BP 106/60 | HR 90 | Wt 184.2 lb

## 2019-05-07 DIAGNOSIS — N898 Other specified noninflammatory disorders of vagina: Secondary | ICD-10-CM | POA: Insufficient documentation

## 2019-05-07 DIAGNOSIS — Z113 Encounter for screening for infections with a predominantly sexual mode of transmission: Secondary | ICD-10-CM

## 2019-05-07 LAB — POCT WET PREP (WET MOUNT)
Clue Cells Wet Prep Whiff POC: NEGATIVE
Trichomonas Wet Prep HPF POC: ABSENT

## 2019-05-08 LAB — CERVICOVAGINAL ANCILLARY ONLY
Chlamydia: POSITIVE — AB
Comment: NEGATIVE
Comment: NORMAL
Neisseria Gonorrhea: NEGATIVE

## 2019-05-09 ENCOUNTER — Telehealth: Payer: Self-pay

## 2019-05-09 MED ORDER — AZITHROMYCIN 500 MG PO TABS: ORAL_TABLET | ORAL | 0 refills | Status: DC

## 2019-05-09 NOTE — Assessment & Plan Note (Signed)
STI screening shows positive for chlamydia.  I have called in an antibiotic and spoken with her on the phone.

## 2019-05-09 NOTE — Progress Notes (Signed)
    CHIEF COMPLAINT / HPI: Pain with intercourse.  This is never happened before.  No bleeding.  Her menses have been regular.  She has had a history of BV in the past but is not noting any discharge or fishy smell at the moment.   PERTINENT  PMH / PSH: I have reviewed the patient's medications, allergies, past medical and surgical history, smoking status and updated in the EMR as appropriate.   OBJECTIVE:  BP 106/60   Pulse 90   Wt 184 lb 3.2 oz (83.6 kg)   SpO2 99%   BMI 34.80 kg/m  GU externally normal.  Cervix appears somewhat irritated but there is no bleeding.  No friability noted.  She did have some pain with sampling of the cervical os with Q-tip.  ASSESSMENT / PLAN:   Screen for STD (sexually transmitted disease) STI screening shows positive for chlamydia.  I have called in an antibiotic and spoken with her on the phone.   Denny Levy MD Addendum: I called the patient and informed her of the positive chlamydia result.  I will call in an antibiotic.  She needs to have her partner treated before they have intercourse again.  She agrees to let him know of the infection and his need for treatment.

## 2019-05-09 NOTE — Telephone Encounter (Signed)
Pharmacy calls for clarification regarding azithromycin prescribed for patient. Order read azithromycin 500mg  4 tablets. (2g) Per Dr. , verbal order given to change to 250mg , 4 tablets (total 1g azithromycin).   Jennette Kettle, RN

## 2019-06-13 ENCOUNTER — Encounter (HOSPITAL_COMMUNITY): Payer: Self-pay | Admitting: *Deleted

## 2019-06-13 ENCOUNTER — Emergency Department (HOSPITAL_COMMUNITY)
Admission: EM | Admit: 2019-06-13 | Discharge: 2019-06-13 | Disposition: A | Discharge status: Home / Self Care | Payer: Medicaid Other | Attending: Emergency Medicine | Admitting: Emergency Medicine

## 2019-06-13 ENCOUNTER — Other Ambulatory Visit: Payer: Self-pay

## 2019-06-13 DIAGNOSIS — R509 Fever, unspecified: Secondary | ICD-10-CM | POA: Insufficient documentation

## 2019-06-13 DIAGNOSIS — J020 Streptococcal pharyngitis: Secondary | ICD-10-CM | POA: Insufficient documentation

## 2019-06-13 DIAGNOSIS — Z91010 Allergy to peanuts: Secondary | ICD-10-CM | POA: Insufficient documentation

## 2019-06-13 LAB — GROUP A STREP BY PCR: Group A Strep by PCR: DETECTED — AB

## 2019-06-13 MED ORDER — PENICILLIN G BENZATHINE 1200000 UNIT/2ML IM SUSP
1.2000 10*6.[IU] | Freq: Once | INTRAMUSCULAR | Status: AC
  Administered 2019-06-13: 1.2 10*6.[IU] via INTRAMUSCULAR
  Filled 2019-06-13: qty 2

## 2019-06-13 NOTE — Discharge Instructions (Signed)
Viral Illness TREATMENT  Treatment is directed at relieving symptoms.  Increased fluid intake. Sports drinks offer valuable electrolytes, sugars, and fluids.  Breathing heated mist or steam (vaporizer or shower).  Eating chicken soup or other clear broths, and maintaining good nutrition.  Getting plenty of rest.  Using gargles or lozenges for comfort.  Increasing usage of your inhaler if you have asthma.  Return to work when your temperature has returned to normal.  Gargle warm salt water and spit it out for sore throat. Take benadryl to decrease sinus secretions. Continue to alternate between Tylenol and ibuprofen for pain and fever control.  Follow Up: Follow up with your primary care doctor in 5-7 days for recheck of ongoing symptoms.  Return to emergency department for emergent changing or worsening of symptoms.  I have given you a referral number for ear nose and throat.  Call them Monday and discuss setting up an appointment for possible consideration of tonsillectomy.  You have had numerous episodes of pharyngitis for the past year.  As you have told me you have had at least 5 confirmed strep cases.

## 2019-06-13 NOTE — ED Triage Notes (Signed)
The pt has had a sore throat for 2 days unknown temp

## 2019-06-13 NOTE — ED Provider Notes (Signed)
[facility] EMERGENCY DEPARTMENT Provider Note   CSN: [id number] Arrival date & time: 06/13/19  1926     History Chief Complaint  Patient presents with  . Sore Throat    April [name] is a 29 y.o. female.  HPI Patient has had right-sided moderate, nonradiating achy/stabbing/constant throat pain that is worse with swallowing.  She states she has been eating and drinking but has had decreased appetite.  She is able to swallow without difficulty and having no difficulty phonating or breathing.  She denies any muffled voice.  Patient states she has a history of strep pharyngitis and had it 4 times last year. Similar today.  She states she has had some subjective fevers and chills.  Denies any nausea, vomiting, diarrhea.  Denies any chest pain or abdominal pain.      Past Medical History:  Diagnosis Date  . Medical history non-contributory     Patient Active Problem List   Diagnosis Date Noted  . Irregular menstrual cycle 07/08/2018  . Bacterial vaginosis 01/12/2017  . Screen for STD (sexually transmitted disease) 01/11/2017  . Anxiety and depression 01/05/2016  . Healthcare maintenance 01/05/2016  . Contraception management 11/02/2010    Past Surgical History:  Procedure Laterality Date  . NO PAST SURGERIES    . WISDOM TOOTH EXTRACTION  2013     OB History    Gravida  1   Para  1   Term  1   Preterm  0   AB  0   Living  1     SAB  0   TAB  0   Ectopic  0   Multiple  0   Live Births  1           Family History  Problem Relation Age of Onset  . Diabetes Maternal Aunt   . Diabetes Maternal Uncle   . Hypertension Maternal Uncle   . Diabetes Maternal Grandmother   . Hypertension Maternal Grandmother   . Cancer Maternal Grandfather   . Hearing loss Neg Hx     Social History   Tobacco Use  . Smoking status: Never Smoker  . Smokeless tobacco: Never Used  Substance Use Topics  . Alcohol use: No  . Drug use: No     Home Medications Prior to Admission medications   Medication Sig Start Date End Date Taking? Authorizing Provider  OVER THE COUNTER MEDICATION Take 1 capsule by mouth as needed (cold and flu). OTC cold and flu medication   Yes [provider]    Allergies    Eggs or egg-derived products, Peanut-containing drug products, and Fish allergy  Review of Systems   Review of Systems  Constitutional: Positive for chills and fever.  HENT: Positive for sore throat. Negative for congestion.   Eyes: Negative for pain.  Respiratory: Negative for cough and shortness of breath.   Cardiovascular: Negative for chest pain and leg swelling.  Gastrointestinal: Negative for abdominal distention, abdominal pain and vomiting.  Genitourinary: Negative for dysuria.  Musculoskeletal: Negative for myalgias.  Skin: Negative for rash.  Neurological: Negative for dizziness and headaches.    Physical Exam Updated Vital Signs BP 120/71 (BP Location: Right Arm)   Pulse 88   Temp 99.7 F (37.6 C) (Oral)   Resp 18   Ht 5\' 8"  (1.727 m)   Wt 82.6 kg   SpO2 98%   BMI 27.67 kg/m   Physical Exam Vitals and nursing note reviewed.  Constitutional:  General: She is not in acute distress.    Comments: Patient is phonating without difficulty.  She is healthy 29 year old female in no acute distress.  HENT:     Head: Normocephalic and atraumatic.     Comments: Patient does have right tonsillar exudate and 1+ swelling of the right tonsil.  Uvula is midline.  No trismus.  No evidence of oral lesions.    Nose: Nose normal.     Mouth/Throat:     Mouth: Mucous membranes are moist. No oral lesions.     Pharynx: Uvula midline. Oropharyngeal exudate and posterior oropharyngeal erythema present. No uvula swelling.     Tonsils: Tonsillar exudate present. 1+ on the right. 0 on the left.  Eyes:     General: No scleral icterus. Cardiovascular:     Rate and Rhythm: Normal rate and regular rhythm.      Pulses: Normal pulses.     Heart sounds: Normal heart sounds.  Pulmonary:     Effort: Pulmonary effort is normal. No respiratory distress.     Breath sounds: No wheezing.  Abdominal:     Palpations: Abdomen is soft.     Tenderness: There is no abdominal tenderness.  Musculoskeletal:     Cervical back: Normal range of motion.     Right lower leg: No edema.     Left lower leg: No edema.  Skin:    General: Skin is warm and dry.     Capillary Refill: Capillary refill takes less than 2 seconds.  Neurological:     Mental Status: She is alert. Mental status is at baseline.  Psychiatric:        Mood and Affect: Mood normal.        Behavior: Behavior normal.     ED Results / Procedures / Treatments   Labs (all labs ordered are listed, but only abnormal results are displayed) Labs Reviewed  GROUP A STREP BY PCR - Abnormal; Notable for the following components:      Result Value   Group A Strep by PCR DETECTED (*)    All other components within normal limits    EKG None  Radiology No results found.  Procedures Procedures (including critical care time)  Medications Ordered in ED Medications  penicillin g benzathine (BICILLIN LA) 1200000 UNIT/2ML injection 1.2 Million Units (1.2 Million Units Intramuscular Given 06/13/19 2116)    ED Course  I have reviewed the triage vital signs and the nursing notes.  Pertinent labs & imaging results that were available during my care of the patient were reviewed by me and considered in my medical decision making (see chart for details).    MDM Rules/Calculators/A&P                       29 year old female here with sore throat.  She has a low-grade fever here but is nontoxic in appearance.    Tonsillar exudate and right-sided tonsillar hypertrophy has 1+. Uvula remains midline without any signs of peritonsillar abscess.  She is handling secretions well, normal phonation without stridor.  Does have some lymphadenopathy on the right side.   Rapid strep was sent and is positive-- will treat with Bicillin here. Can continue Tylenol/Motrin for fever.  Follow-up with PCP.  Return here for any new/acute changes.  Patient has no evidence of Ludwick's, RTA, PTA, mucomycosis.  She is well-appearing at time of discharge.  His vital signs within normal limits.  Initially tachycardic but this resolved with some p.o.  water.  Is ambulatory and tolerating p.o. without difficulty.  Final Clinical Impression(s) / ED Diagnoses Final diagnoses:  Strep throat    Rx / DC Orders ED Discharge Orders    None       Tedd Sias, [place] 06/14/19 9292    Lajean Saver, MD 06/16/19 [phone]

## 2019-07-31 ENCOUNTER — Other Ambulatory Visit (HOSPITAL_COMMUNITY)
Admission: RE | Admit: 2019-07-31 | Discharge: 2019-07-31 | Disposition: A | Discharge status: Home / Self Care | Payer: Medicaid Other | Source: Ambulatory Visit | Attending: Family Medicine | Admitting: Family Medicine

## 2019-07-31 ENCOUNTER — Other Ambulatory Visit: Payer: Self-pay

## 2019-07-31 ENCOUNTER — Ambulatory Visit (INDEPENDENT_AMBULATORY_CARE_PROVIDER_SITE_OTHER): Payer: Medicaid Other | Admitting: Family Medicine

## 2019-07-31 ENCOUNTER — Encounter: Payer: Self-pay | Admitting: Family Medicine

## 2019-07-31 VITALS — BP 95/62 | HR 78 | Temp 98.0°F | Wt 181.0 lb

## 2019-07-31 DIAGNOSIS — N898 Other specified noninflammatory disorders of vagina: Secondary | ICD-10-CM | POA: Insufficient documentation

## 2019-07-31 DIAGNOSIS — Z202 Contact with and (suspected) exposure to infections with a predominantly sexual mode of transmission: Secondary | ICD-10-CM

## 2019-07-31 LAB — POCT WET PREP (WET MOUNT)
Clue Cells Wet Prep Whiff POC: NEGATIVE
Trichomonas Wet Prep HPF POC: ABSENT

## 2019-07-31 MED ORDER — METRONIDAZOLE 500 MG PO TABS: 500.0000 mg | ORAL_TABLET | Freq: Two times a day (BID) | ORAL | 0 refills | Status: DC

## 2019-07-31 MED ORDER — FLUCONAZOLE 150 MG PO TABS: 150.0000 mg | ORAL_TABLET | Freq: Once | ORAL | 0 refills | Status: AC

## 2019-07-31 NOTE — Assessment & Plan Note (Signed)
Wet prep consistent with BV and yeast. Will provide flagyl x7 days with diflucan now and after flagyl course. Also obtained GC/CT, HIV, RPR, will call with results. UTD with pap smear. Safe sex practices reviewed. Discussed contraception at length, however patient not interested at this time. Follow up prn.

## 2019-07-31 NOTE — Progress Notes (Signed)
    SUBJECTIVE:   CHIEF COMPLAINT / HPI:   VAGINAL DISCHARGE  Started having vaginal discharge 2 weeks ago, went away with menses, came back 2 days ago.  Discharge consistency: creamy Discharge color: white Medications tried: herbal remedies (apple cider vinegar)  Recent antibiotic use: no Sex in last month: yes Possible STD exposure:not sure, sometimes wears condoms. New partner last month.  Does not use contraception, does not want any contraception. Had nexplanon removed December of last year.   Symptoms Fever: no Dysuria: no Vaginal bleeding: no Abdomen or Pelvic pain: no Back pain: no Genital sores or ulcers:no Rash: no Pain during sex: no Missed menstrual period: no, LMP 07/15/19  PERTINENT  PMH / PSH: anxiety/depression, irregular menses  OBJECTIVE:   BP 95/62   Pulse 78   Temp 98 F (36.7 C) (Oral)   Wt 181 lb (82.1 kg)   LMP 07/15/2019   SpO2 98%   BMI 27.52 kg/m   Gen: well appearing, in NAD GYN:  External genitalia within normal limits.  Vaginal mucosa pink, moist, normal rugae.  Nonfriable cervix without lesions, no bleeding noted on speculum exam. Moderate amount of creamy white discharge.  No cervical motion tenderness.    ASSESSMENT/PLAN:   Vaginal discharge Wet prep consistent with BV and yeast. Will provide flagyl x7 days with diflucan now and after flagyl course. Also obtained GC/CT, HIV, RPR, will call with results. UTD with pap smear. Safe sex practices reviewed. Discussed contraception at length, however patient not interested at this time. Follow up prn.    Ellwood Dense, DO [facility]

## 2019-07-31 NOTE — Patient Instructions (Signed)
It was great to see you!  Our plans for today:  - We are checking some labs today, we will call you or send you a letter if they are abnormal.  - Let us know if you change your mind about birth control.  - Make sure to wear condoms all the time every time you have sex to protect yourself against sexually transmitted infections.   Take care and seek immediate care sooner if you develop any concerns.   Dr. [facility]

## 2019-08-01 LAB — RPR: RPR Ser Ql: NONREACTIVE

## 2019-08-01 LAB — HIV ANTIBODY (ROUTINE TESTING W REFLEX): HIV Screen 4th Generation wRfx: NONREACTIVE

## 2019-08-04 LAB — CERVICOVAGINAL ANCILLARY ONLY
Chlamydia: NEGATIVE
Comment: NEGATIVE
Comment: NORMAL
Neisseria Gonorrhea: NEGATIVE

## 2019-10-27 ENCOUNTER — Ambulatory Visit: Payer: Medicaid Other

## 2019-10-27 NOTE — Progress Notes (Deleted)
    SUBJECTIVE:   CHIEF COMPLAINT / HPI:   Excessive body odor ***  PERTINENT  PMH / PSH: ***  OBJECTIVE:   There were no vitals taken for this visit.   General: NAD, pleasant, able to participate in exam Cardiac: RRR, no murmurs. Respiratory: CTAB, normal effort, No wheezes, rales or rhonchi Abdomen: Bowel sounds present, nontender, nondistended, no hepatosplenomegaly. Extremities: no edema or cyanosis. Skin: warm and dry, no rashes noted Neuro: alert, no obvious focal deficits Psych: Normal affect and mood  ASSESSMENT/PLAN:   No problem-specific Assessment & Plan notes found for this encounter.     Jackelyn Poling, DO [facility]

## 2019-10-28 ENCOUNTER — Other Ambulatory Visit (HOSPITAL_COMMUNITY)
Admission: RE | Admit: 2019-10-28 | Discharge: 2019-10-28 | Disposition: A | Discharge status: Home / Self Care | Payer: Medicaid Other | Source: Ambulatory Visit | Attending: Family Medicine | Admitting: Family Medicine

## 2019-10-28 ENCOUNTER — Other Ambulatory Visit: Payer: Self-pay

## 2019-10-28 ENCOUNTER — Ambulatory Visit (INDEPENDENT_AMBULATORY_CARE_PROVIDER_SITE_OTHER): Payer: Medicaid Other | Admitting: Family Medicine

## 2019-10-28 VITALS — BP 102/60 | HR 79 | Ht 68.0 in | Wt 189.2 lb

## 2019-10-28 DIAGNOSIS — L748 Other eccrine sweat disorders: Secondary | ICD-10-CM | POA: Diagnosis not present

## 2019-10-28 DIAGNOSIS — L750 Bromhidrosis: Secondary | ICD-10-CM

## 2019-10-28 DIAGNOSIS — Z113 Encounter for screening for infections with a predominantly sexual mode of transmission: Secondary | ICD-10-CM

## 2019-10-28 DIAGNOSIS — B9689 Other specified bacterial agents as the cause of diseases classified elsewhere: Secondary | ICD-10-CM

## 2019-10-28 DIAGNOSIS — N760 Acute vaginitis: Secondary | ICD-10-CM | POA: Diagnosis not present

## 2019-10-28 LAB — POCT WET PREP (WET MOUNT)
Clue Cells Wet Prep Whiff POC: POSITIVE
Trichomonas Wet Prep HPF POC: ABSENT

## 2019-10-28 NOTE — Progress Notes (Signed)
    SUBJECTIVE:   CHIEF COMPLAINT / HPI:   April [name] is a 29 yr old female who presents today for concerns of odor   Body odor Pt reports using arm and hammer deodorant 2 weeks and since reports foul smelling body odor which smells like metal. Her boyfriend also used the same deodorant and experienced similar symptoms. Even though she is bathing daily which helps with the odor but it returns after she sweats.   Vaginal odor Has noticed a foul smelling odor from her vagina over the last 2 weeks. She thinks its may be related to using the deodorant but is unsure. Denies vaginal discharge, irregular bleeding or lower abdominal pain . Is sexually active with her boyfriend who she says has been faithful. They do not use condoms and she is not on any form of birth control. When asked whether she wants to get pregnant she said yes, she would happy to but is not actively trying She has previously tested for STDs and has had chlamydia in the past and she was treated adequately. LMP 15th July, her cycles are usually regular, occur every 33 days and last 3-4 days.   PERTINENT  PMH / PSH: chlamydia  OBJECTIVE:   BP 102/60   Pulse 79   Ht 5\' 8"  (1.727 m)   Wt 189 lb 3.2 oz (85.8 kg)   LMP 10/23/2019 (Exact Date)   SpO2 98%   BMI 28.77 kg/m   General: Alert and cooperative and appears to be in no acute distress Cardio: well perfused  Pulm: speaking in full sentences. Normal respiratory effort Abdomen: Bowel sounds normal. Abdomen soft and non-tender.  Extremities: No peripheral edema. Warm/ well perfused.  Neuro: Cranial nerves grossly intact  Pelvic exam chaperoned by CMA Shelly. Cervix was easily visualized on speculum exam and GC, chlamydia and wet prep swabs taken. Internal exam was normal.  ASSESSMENT/PLAN:   Bacterial vaginosis Wet prep with clue cells and positive whiff test. Sent metronidazole to pharmacy and called patient however no answer and I was unable to leave VM. She called  back later and CMA explained that treatment for BV was sent to the pharmacy.   Screen for STD (sexually transmitted disease) GC, chlamydia swabs taken today. Will inform patient of results.   Body odor Pt endorses abnormal and persistent body odor since using different deodorant to what she usually uses. She does report increased sweating recently due it being very hot. I suspect the body odor is due to the sweating and normal. I reassured the patient, recommends she baths daily and uses her old deodorant. Explained that deodorants have metal components in them which can sometimes leave a metallic odor. Follow up with PCP if she is still having persistent symptoms.   10/25/2019, MD [facility]

## 2019-10-28 NOTE — Patient Instructions (Addendum)
Alante, It was great to meet you today!! I think the body odor may be related to the deodorant you were using. Please go back to using your regular deodorant and wash and bathe as normal. The odor from your vagina may be linked to sweating in the heat or an infection. We did some swabs today, I will call you if the results are abnormal. Do not over wash the genital area but keep it clean. Wear clothes which are breathable in the hot weather.   Please follow up with your PCP if you have no improvement in symptoms.  Best wishes,  Dr Allena Katz

## 2019-10-29 ENCOUNTER — Other Ambulatory Visit: Payer: Self-pay | Admitting: Family Medicine

## 2019-10-29 ENCOUNTER — Telehealth: Payer: Self-pay | Admitting: Family Medicine

## 2019-10-29 LAB — CERVICOVAGINAL ANCILLARY ONLY
Chlamydia: NEGATIVE
Comment: NEGATIVE
Comment: NORMAL
Neisseria Gonorrhea: NEGATIVE

## 2019-10-29 MED ORDER — METRONIDAZOLE 500 MG PO TABS: 500.0000 mg | ORAL_TABLET | Freq: Two times a day (BID) | ORAL | 0 refills | Status: AC

## 2019-10-29 NOTE — Telephone Encounter (Signed)
Tried to call patient and inform her that she has BV. Voicemail box full. Her meds have been sent to the pharmacy.

## 2019-10-29 NOTE — Telephone Encounter (Signed)
Patient returns call to nurse line. Informed of below.   Mohan Erven C Rehaan Viloria, RN  

## 2019-10-30 DIAGNOSIS — L750 Bromhidrosis: Secondary | ICD-10-CM | POA: Insufficient documentation

## 2019-10-30 NOTE — Assessment & Plan Note (Signed)
GC, chlamydia swabs taken today. Will inform patient of results.

## 2019-10-30 NOTE — Assessment & Plan Note (Addendum)
Pt endorses abnormal and persistent body odor since using different deodorant to what she usually uses. She does report increased sweating recently due it being very hot. I suspect the body odor is due to the sweating and normal. I reassured the patient, recommends she baths daily and uses her old deodorant. Explained that deodorants have metal components in them which can sometimes leave a metallic odor. Follow up with PCP if she is still having persistent symptoms.

## 2019-10-30 NOTE — Assessment & Plan Note (Addendum)
Wet prep with clue cells and positive whiff test. Sent metronidazole to pharmacy and called patient however no answer and I was unable to leave VM. She called back later and CMA explained that treatment for BV was sent to the pharmacy.

## 2019-12-21 NOTE — Progress Notes (Signed)
    SUBJECTIVE:   CHIEF COMPLAINT / HPI:   STD check: Patient reports to clinic today for STD checking.  She reports some changes in her vaginal discharge, but nothing particularly specific.  Denies any vaginal odor, itch, burn.  Would also like RPR/HIV testing as well.  PERTINENT  PMH / PSH:  Patient Active Problem List   Diagnosis Date Noted  . Body odor 10/30/2019  . Irregular menstrual cycle 07/08/2018  . Vaginal discharge 04/15/2018  . Bacterial vaginosis 01/12/2017  . Screen for STD (sexually transmitted disease) 01/11/2017  . Anxiety and depression 01/05/2016  . Healthcare maintenance 01/05/2016  . Contraception management 11/02/2010     OBJECTIVE:   BP 110/70   Pulse 93   Ht 5\' 2"  (1.575 m)   Wt 187 lb 4 oz (84.9 kg)   LMP 12/06/2019   SpO2 98%   BMI 34.25 kg/m    Physical exam: General: Pleasant patient, nontoxic-appearing Respiratory: CTA bilaterally, comfortable work of breathing Cardio: RRR, S1-S2 present, no murmurs appreciated GU: Normal-appearing labia minora and majora, scant thin white vaginal discharge appreciated, no fishy odor appreciated, normal-appearing vaginal rugae, cervix without lesions or bleeding   ASSESSMENT/PLAN:   Screen for STD (sexually transmitted disease) Patient screened for STDs, GC/chlamydia, trichomonas.  Wet prep performed to rule out bacterial vaginosis, Candida. -We will call patient with results, treat as needed     12/08/2019, DO [facility]

## 2019-12-22 ENCOUNTER — Other Ambulatory Visit (HOSPITAL_COMMUNITY)
Admission: RE | Admit: 2019-12-22 | Discharge: 2019-12-22 | Disposition: A | Discharge status: Home / Self Care | Payer: Medicaid Other | Source: Ambulatory Visit | Attending: Family Medicine | Admitting: Family Medicine

## 2019-12-22 ENCOUNTER — Encounter: Payer: Self-pay | Admitting: Family Medicine

## 2019-12-22 ENCOUNTER — Ambulatory Visit (INDEPENDENT_AMBULATORY_CARE_PROVIDER_SITE_OTHER): Payer: Medicaid Other | Admitting: Family Medicine

## 2019-12-22 ENCOUNTER — Other Ambulatory Visit: Payer: Self-pay

## 2019-12-22 VITALS — BP 110/70 | HR 93 | Ht 62.0 in | Wt 187.2 lb

## 2019-12-22 DIAGNOSIS — N898 Other specified noninflammatory disorders of vagina: Secondary | ICD-10-CM | POA: Diagnosis not present

## 2019-12-22 DIAGNOSIS — Z113 Encounter for screening for infections with a predominantly sexual mode of transmission: Secondary | ICD-10-CM | POA: Insufficient documentation

## 2019-12-22 LAB — POCT WET PREP (WET MOUNT)
Clue Cells Wet Prep Whiff POC: NEGATIVE
Trichomonas Wet Prep HPF POC: ABSENT

## 2019-12-22 NOTE — Assessment & Plan Note (Signed)
Patient screened for STDs, GC/chlamydia, trichomonas.  Wet prep performed to rule out bacterial vaginosis, Candida. -We will call patient with results, treat as needed

## 2019-12-23 ENCOUNTER — Encounter: Payer: Self-pay | Admitting: Family Medicine

## 2019-12-23 LAB — CERVICOVAGINAL ANCILLARY ONLY
Chlamydia: NEGATIVE
Comment: NEGATIVE
Comment: NEGATIVE
Comment: NORMAL
Neisseria Gonorrhea: NEGATIVE
Trichomonas: NEGATIVE

## 2019-12-23 LAB — HIV ANTIBODY (ROUTINE TESTING W REFLEX): HIV Screen 4th Generation wRfx: NONREACTIVE

## 2019-12-23 LAB — RPR: RPR Ser Ql: NONREACTIVE

## 2020-05-07 DIAGNOSIS — Z20822 Contact with and (suspected) exposure to covid-19: Secondary | ICD-10-CM | POA: Diagnosis not present

## 2020-06-28 ENCOUNTER — Other Ambulatory Visit: Payer: Self-pay

## 2020-06-28 ENCOUNTER — Other Ambulatory Visit (HOSPITAL_COMMUNITY)
Admission: RE | Admit: 2020-06-28 | Discharge: 2020-06-28 | Disposition: A | Discharge status: Home / Self Care | Payer: Medicaid Other | Source: Ambulatory Visit | Attending: Family Medicine | Admitting: Family Medicine

## 2020-06-28 ENCOUNTER — Ambulatory Visit (INDEPENDENT_AMBULATORY_CARE_PROVIDER_SITE_OTHER): Payer: Medicaid Other | Admitting: Family Medicine

## 2020-06-28 VITALS — BP 106/68 | HR 109 | Ht 62.0 in | Wt 189.0 lb

## 2020-06-28 DIAGNOSIS — Z1159 Encounter for screening for other viral diseases: Secondary | ICD-10-CM | POA: Diagnosis not present

## 2020-06-28 DIAGNOSIS — Z114 Encounter for screening for human immunodeficiency virus [HIV]: Secondary | ICD-10-CM | POA: Diagnosis not present

## 2020-06-28 DIAGNOSIS — Z113 Encounter for screening for infections with a predominantly sexual mode of transmission: Secondary | ICD-10-CM

## 2020-06-28 NOTE — Patient Instructions (Signed)
It was wonderful meeting you today.  We tested you for all commonly tested for STDs.  Those samples have to be sent off for evaluation and the results will come back in a few days.  I will post the results to your MyChart or call you if there are any abnormalities.  If you do come back with a yeast infection or bacterial vaginosis I can call a prescription into your pharmacy.  If you have any questions or concerns please feel free to call the clinic.  I hope you have a wonderful afternoon!

## 2020-06-28 NOTE — Progress Notes (Signed)
    SUBJECTIVE:   CHIEF COMPLAINT / HPI:   STD screening Patient has been sexually active with multiple partners in the last year.  She likes to have STD screenings whenever she gets a new partner and she is with a new partner at this time.  She denies any signs or symptoms of STIs at this time.  She is requesting testing for gonorrhea and chlamydia as well as blood tests.  She is not on birth control, does not use protection regularly.  Discussed the use of birth control with the patient and she would not like it at this time.    OBJECTIVE:   BP 106/68   Pulse (!) 109   Ht 5\' 2"  (1.575 m)   Wt 189 lb (85.7 kg)   LMP 06/09/2020 (Exact Date)   SpO2 98%   BMI 34.57 kg/m   General: Well-appearing 30 year old female in no acute distress Respiratory: Normal work of breathing GU: Normal external female genitalia, normal vaginal tissue with moderate amount of white discharge, no cervical friability or ectopy MSK: No gross abnormalities  ASSESSMENT/PLAN:   Screen for STD (sexually transmitted disease) Patient screened for STDs, GC/chlamydia, trichomoniasis.  Also screen for BV and candidiasis.  Blood work collected for HIV, RPR, hepatitis C.  Will call patient with any abnormalities.  Discussed birth control with the patient and she is not interested at this time.  Return precautions given.     34, MD [facility]

## 2020-06-28 NOTE — Assessment & Plan Note (Signed)
Patient screened for STDs, GC/chlamydia, trichomoniasis.  Also screen for BV and candidiasis.  Blood work collected for HIV, RPR, hepatitis C.  Will call patient with any abnormalities.  Discussed birth control with the patient and she is not interested at this time.  Return precautions given.

## 2020-06-29 LAB — CERVICOVAGINAL ANCILLARY ONLY
Bacterial Vaginitis (gardnerella): POSITIVE — AB
Candida Glabrata: NEGATIVE
Candida Vaginitis: POSITIVE — AB
Chlamydia: NEGATIVE
Comment: NEGATIVE
Comment: NEGATIVE
Comment: NEGATIVE
Comment: NEGATIVE
Comment: NEGATIVE
Comment: NORMAL
Neisseria Gonorrhea: NEGATIVE
Trichomonas: NEGATIVE

## 2020-06-29 LAB — HIV ANTIBODY (ROUTINE TESTING W REFLEX): HIV Screen 4th Generation wRfx: NONREACTIVE

## 2020-06-29 LAB — HEPATITIS C ANTIBODY: Hep C Virus Ab: <0.1 s/co ratio (ref 0.0–0.9)

## 2020-06-29 LAB — RPR: RPR Ser Ql: NONREACTIVE

## 2020-07-13 MED ORDER — FLUCONAZOLE 150 MG PO TABS: 150.0000 mg | ORAL_TABLET | Freq: Once | ORAL | 0 refills | Status: AC

## 2020-07-13 MED ORDER — METRONIDAZOLE 500 MG PO TABS: 500.0000 mg | ORAL_TABLET | Freq: Two times a day (BID) | ORAL | 0 refills | Status: DC

## 2020-07-13 NOTE — Addendum Note (Signed)
Addended by: Celedonio Savage on: 07/13/2020 01:38 PM   Modules accepted: Orders

## 2020-08-30 ENCOUNTER — Ambulatory Visit (INDEPENDENT_AMBULATORY_CARE_PROVIDER_SITE_OTHER): Payer: Self-pay | Admitting: Family Medicine

## 2020-08-30 ENCOUNTER — Encounter: Payer: Self-pay | Admitting: Family Medicine

## 2020-08-30 ENCOUNTER — Other Ambulatory Visit (HOSPITAL_COMMUNITY)
Admission: RE | Admit: 2020-08-30 | Discharge: 2020-08-30 | Disposition: A | Discharge status: Home / Self Care | Payer: Medicaid Other | Source: Ambulatory Visit | Attending: Family Medicine | Admitting: Family Medicine

## 2020-08-30 ENCOUNTER — Other Ambulatory Visit: Payer: Self-pay

## 2020-08-30 VITALS — BP 111/70 | HR 72 | Ht 60.0 in | Wt 188.6 lb

## 2020-08-30 DIAGNOSIS — N898 Other specified noninflammatory disorders of vagina: Secondary | ICD-10-CM | POA: Insufficient documentation

## 2020-08-30 MED ORDER — FLUCONAZOLE 150 MG PO TABS: 150.0000 mg | ORAL_TABLET | Freq: Once | ORAL | 0 refills | Status: AC

## 2020-08-30 NOTE — Patient Instructions (Signed)
It was a pleasure to see you today!  Thank you for choosing [facility] for your primary care.   April [name] was seen for vaginal discharge.   Our plans for today were:  We will collect a vaginal swab to test for any signs of yeast or bacterial infection that could be causing your symptoms.   Based on your exam, I will treat you for yeast infection today and then take the second pill in three days.   To keep you healthy, please keep in mind the following health maintenance items that you are due for:   1. COVID Vaccine  2.     You should return to our clinic if symptoms persist or worsen.   Best Wishes,   Dr. Neita Garnet

## 2020-08-30 NOTE — Progress Notes (Signed)
    SUBJECTIVE:   CHIEF COMPLAINT / HPI: vaginal discharge    Patient presents with concern for vaginal discharge that has been present for 7 days.  She reports that she was recently treated for BV and had diflucan following her abx course for BV but then had more white, thick vaginal discharge associated with pruritis.  She denies abnormal vaginal bleeding with LMP on  08/09/20.  She is sexually active with one female partner.  Patient declines STI testing today    PERTINENT  PMH / PSH:  BV  Vaginal candidiasis   OBJECTIVE:   BP 111/70   Pulse 72   Ht 5' (1.524 m)   Wt 188 lb 9.6 oz (85.5 kg)   LMP 08/09/2020 (Exact Date)   SpO2 98%   BMI 36.83 kg/m   General: female appearing stated age in no acute distress Cardio: Normal S1 and S2, no S3 or S4. Rhythm is regular. Pulm:  Normal respiratory effort Abdomen: Bowel sounds normal. Abdomen soft and non-tender.  Genitalia:  Normal introitus for age, no external lesions, white,thick clumps of vaginal discharge, mucosa pink and moist, no vaginal or cervical lesions, no vaginal atrophy, no friaility or hemorrhage, no cervical motion tenderness   ASSESSMENT/PLAN:   Vaginal discharge Patient presents for vaginal discharge concerning for vaginal candidiasis. Exam consistent with vaginal candidiasis.  - patient declines STI testing at this time  - ancillary swab sent for testing  - will prescribe Diflucan tablets (2) one today and the second in 72 hours  - counseled patient to avoid using soaps or irritating products in vagina  - patient to return if symptoms worsen over the course of the next 5 days       Ronnald Ramp, MD [facility]

## 2020-08-30 NOTE — Assessment & Plan Note (Addendum)
Patient presents for vaginal discharge concerning for vaginal candidiasis. Exam consistent with vaginal candidiasis.  - patient declines STI testing at this time  - ancillary swab sent for testing  - will prescribe Diflucan tablets (2) one today and the second in 72 hours  - counseled patient to avoid using soaps or irritating products in vagina  - patient to return if symptoms worsen over the course of the next 5 days

## 2020-08-31 LAB — CERVICOVAGINAL ANCILLARY ONLY
Bacterial Vaginitis (gardnerella): POSITIVE — AB
Candida Glabrata: NEGATIVE
Candida Vaginitis: POSITIVE — AB
Comment: NEGATIVE
Comment: NEGATIVE
Comment: NEGATIVE
Comment: NEGATIVE
Trichomonas: NEGATIVE

## 2020-09-01 ENCOUNTER — Other Ambulatory Visit: Payer: Self-pay | Admitting: Family Medicine

## 2020-09-01 MED ORDER — FLUCONAZOLE 150 MG PO TABS: 150.0000 mg | ORAL_TABLET | Freq: Once | ORAL | 0 refills | Status: AC

## 2020-09-01 MED ORDER — METRONIDAZOLE 500 MG PO TABS
500.0000 mg | ORAL_TABLET | Freq: Three times a day (TID) | ORAL | 0 refills | Status: DC
Start: 2020-09-01 — End: 2021-03-14

## 2020-10-01 ENCOUNTER — Other Ambulatory Visit (HOSPITAL_COMMUNITY)
Admission: RE | Admit: 2020-10-01 | Discharge: 2020-10-01 | Disposition: A | Discharge status: Home / Self Care | Payer: Medicaid Other | Source: Ambulatory Visit | Attending: Family Medicine | Admitting: Family Medicine

## 2020-10-01 ENCOUNTER — Ambulatory Visit (HOSPITAL_COMMUNITY)
Admission: EM | Admit: 2020-10-01 | Discharge: 2020-10-01 | Disposition: A | Discharge status: Home / Self Care | Payer: Medicaid Other | Attending: Emergency Medicine | Admitting: Emergency Medicine

## 2020-10-01 ENCOUNTER — Encounter (HOSPITAL_COMMUNITY): Payer: Self-pay

## 2020-10-01 ENCOUNTER — Other Ambulatory Visit: Payer: Self-pay

## 2020-10-01 ENCOUNTER — Ambulatory Visit (INDEPENDENT_AMBULATORY_CARE_PROVIDER_SITE_OTHER): Payer: Medicaid Other | Admitting: Family Medicine

## 2020-10-01 VITALS — BP 102/72 | HR 115 | Ht 60.0 in | Wt 183.2 lb

## 2020-10-01 DIAGNOSIS — R059 Cough, unspecified: Secondary | ICD-10-CM | POA: Insufficient documentation

## 2020-10-01 DIAGNOSIS — N898 Other specified noninflammatory disorders of vagina: Secondary | ICD-10-CM | POA: Insufficient documentation

## 2020-10-01 DIAGNOSIS — R5383 Other fatigue: Secondary | ICD-10-CM | POA: Insufficient documentation

## 2020-10-01 DIAGNOSIS — J069 Acute upper respiratory infection, unspecified: Secondary | ICD-10-CM

## 2020-10-01 DIAGNOSIS — U071 COVID-19: Secondary | ICD-10-CM | POA: Insufficient documentation

## 2020-10-01 DIAGNOSIS — J029 Acute pharyngitis, unspecified: Secondary | ICD-10-CM | POA: Insufficient documentation

## 2020-10-01 DIAGNOSIS — Z113 Encounter for screening for infections with a predominantly sexual mode of transmission: Secondary | ICD-10-CM

## 2020-10-01 LAB — POCT WET PREP (WET MOUNT)
Clue Cells Wet Prep Whiff POC: NEGATIVE
Trichomonas Wet Prep HPF POC: ABSENT

## 2020-10-01 MED ORDER — FLUCONAZOLE 150 MG PO TABS: 150.0000 mg | ORAL_TABLET | Freq: Once | ORAL | 0 refills | Status: AC

## 2020-10-01 NOTE — ED Triage Notes (Signed)
Pt present fever, chills and fatigue. Pt would like a covid test. Pt is unaware if she has been exposed. Symptom started last night.

## 2020-10-01 NOTE — Assessment & Plan Note (Signed)
HIV, hep c and RPR obtained recently which were negative. Trich negative today. No need for further testing today.

## 2020-10-01 NOTE — Assessment & Plan Note (Addendum)
Wet prep negative for BV, trich or candida.  Prescribed 150mg  diflucan tablet for pt based on her symptoms. Pending GC/chlamydia results. Provided safe sex counseling and pre-natal vitamins if she is trying to conceive.

## 2020-10-01 NOTE — Progress Notes (Addendum)
     SUBJECTIVE:   CHIEF COMPLAINT / HPI:   April [name] is a 30 y.o. female presents for vaginal discharge, concern for BV    Vaginal Discharge Patient reports that vaginal discharge for a few weeks.  She was treated with diflucan and metrondazole. She notes that discharge appears thick and white.  She denies vaginal odors.  She denies vaginal pruritis, abnormal vaginal bleeding, dysuria, hematuria, frequency, abdominal pain pelvic pain, nausea, vomiting, fevers or new rash.  History of STIs-chlamydia last year.  She is sexually active with one female partner and does not use condoms.  Contraception: none. She does not mind if she gets pregnant with current partner. LMP: 09/10/20.  She does not douche.   Flowsheet Row Office Visit from 10/01/2020 in [facility]  PHQ-9 Total Score 0        PERTINENT  PMH / PSH: BV, anxiety, depression   OBJECTIVE:   BP 102/72   Pulse (!) 115   Ht 5' (1.524 m)   Wt 183 lb 3.2 oz (83.1 kg)   LMP 09/10/2020 (Exact Date)   SpO2 97%   BMI 35.78 kg/m    General: Alert, no acute distress Cardio: well perfused Pulm: normal work of breathing Extremities: No peripheral edema.  Neuro: Cranial nerves grossly intact    Pelvic Exam chaperoned by CMA April        External: normal female genitalia without lesions or masses        Vagina: normal without lesions or masses        Cervix: normal without lesions or masses        Pap smear: performed        Samples for Wet prep, GC/Chlamydia obtained   ASSESSMENT/PLAN:   Vaginal discharge Wet prep negative for BV, trich or candida.  Prescribed 150mg  diflucan tablet for pt based on her symptoms. Pending GC/chlamydia results. Provided safe sex counseling and pre-natal vitamins if she is trying to conceive.   Screen for STD (sexually transmitted disease) HIV, hep c and RPR obtained recently which were negative. Trich negative today. No need for further testing today.      , MD PGY-2 [facility]

## 2020-10-01 NOTE — ED Provider Notes (Signed)
[facility]    CSN: [id number] Arrival date & time: 10/01/20  1553      History   Chief Complaint Chief Complaint  Patient presents with   Chills   Fatigue    HPI April [name] is a 30 y.o. female.   Patient here for evaluation of chills, congestion, sore throat, cough and fatigue that have been ongoing since last night.  Denies any known exposures to COVID.  Reports taking cold medicine last night with some symptom relief.  Denies any trauma, injury, or other precipitating event.  Denies any specific alleviating or aggravating factors.  Denies any chest pain, shortness of breath, N/V/D, numbness, tingling, weakness, abdominal pain.    The history is provided by the patient.   Past Medical History:  Diagnosis Date   Medical history non-contributory     Patient Active Problem List   Diagnosis Date Noted   Body odor 10/30/2019   Irregular menstrual cycle 07/08/2018   Vaginal discharge 04/15/2018   Bacterial vaginosis 01/12/2017   Screen for STD (sexually transmitted disease) 01/11/2017   Anxiety and depression 01/05/2016   Healthcare maintenance 01/05/2016   Contraception management 11/02/2010    Past Surgical History:  Procedure Laterality Date   NO PAST SURGERIES     WISDOM TOOTH EXTRACTION  2013    OB History     Gravida  1   Para  1   Term  1   Preterm  0   AB  0   Living  1      SAB  0   IAB  0   Ectopic  0   Multiple  0   Live Births  1            Home Medications    Prior to Admission medications   Medication Sig Start Date End Date Taking? Authorizing Provider  fluconazole (DIFLUCAN) 150 MG tablet Take 1 tablet (150 mg total) by mouth once for 1 dose. 10/01/20 10/01/20  Towanda Octave, MD  metroNIDAZOLE (FLAGYL) 500 MG tablet Take 1 tablet (500 mg total) by mouth 3 (three) times daily. 09/01/20   Simmons-Robinson, Tawanna Cooler, MD  OVER THE COUNTER MEDICATION Take 1 capsule by mouth as needed (cold and flu). OTC cold and  flu medication    [provider]    Family History Family History  Problem Relation Age of Onset   Diabetes Maternal Aunt    Diabetes Maternal Uncle    Hypertension Maternal Uncle    Diabetes Maternal Grandmother    Hypertension Maternal Grandmother    Cancer Maternal Grandfather    Hearing loss Neg Hx     Social History Social History   Tobacco Use   Smoking status: Never   Smokeless tobacco: Never  Substance Use Topics   Alcohol use: No   Drug use: No     Allergies   Eggs or egg-derived products, Peanut-containing drug products, and Fish allergy   Review of Systems Review of Systems  Constitutional:  Positive for chills and fatigue.  HENT:  Positive for congestion and sore throat. Negative for trouble swallowing.   Respiratory:  Positive for cough. Negative for shortness of breath.   Cardiovascular:  Negative for chest pain.  All other systems reviewed and are negative.   Physical Exam Triage Vital Signs ED Triage Vitals  Enc Vitals Group     BP 10/01/20 1640 109/73     Pulse Rate 10/01/20 1640 (!) 105     Resp 10/01/20  1640 16     Temp 10/01/20 1640 99.4 F (37.4 C)     Temp Source 10/01/20 1640 Oral     SpO2 10/01/20 1640 96 %     Weight --      Height --      Head Circumference --      Peak Flow --      Pain Score 10/01/20 1641 0     Pain Loc --      Pain Edu? --      Excl. in GC? --    No data found.  Updated Vital Signs BP 109/73 (BP Location: Right Arm)   Pulse (!) 105   Temp 99.4 F (37.4 C) (Oral)   Resp 16   LMP 09/10/2020 (Exact Date)   SpO2 96%   Visual Acuity Right Eye Distance:   Left Eye Distance:   Bilateral Distance:    Right Eye Near:   Left Eye Near:    Bilateral Near:     Physical Exam Vitals and nursing note reviewed.  Constitutional:      General: She is not in acute distress.    Appearance: Normal appearance. She is not ill-appearing, toxic-appearing or diaphoretic.  HENT:     Head: Normocephalic  and atraumatic.     Nose: Nose normal.     Mouth/Throat:     Pharynx: No oropharyngeal exudate or posterior oropharyngeal erythema.  Eyes:     Extraocular Movements: Extraocular movements intact.     Conjunctiva/sclera: Conjunctivae normal.     Pupils: Pupils are equal, round, and reactive to light.  Cardiovascular:     Rate and Rhythm: Normal rate.     Pulses: Normal pulses.     Heart sounds: Normal heart sounds.  Pulmonary:     Effort: Pulmonary effort is normal.     Breath sounds: Normal breath sounds.  Abdominal:     General: Abdomen is flat.  Musculoskeletal:        General: Normal range of motion.     Cervical back: Normal range of motion.  Skin:    General: Skin is warm and dry.  Neurological:     General: No focal deficit present.     Mental Status: She is alert and oriented to person, place, and time.  Psychiatric:        Mood and Affect: Mood normal.     UC Treatments / Results  Labs (all labs ordered are listed, but only abnormal results are displayed) Labs Reviewed  SARS CORONAVIRUS 2 (TAT 6-24 HRS)    EKG   Radiology No results found.  Procedures Procedures (including critical care time)  Medications Ordered in UC Medications - No data to display  Initial Impression / Assessment and Plan / UC Course  I have reviewed the triage vital signs and the nursing notes.  Pertinent labs & imaging results that were available during my care of the patient were reviewed by me and considered in my medical decision making (see chart for details).     Assessment negative for red flags or concerns.  COVID test pending.  Discussed that patient needs to quarantine while waiting on results and for at least 5 days from symptom onset if it is positive.  Discussed conservative symptom management as described in discharge instructions.  Encouraged fluids and rest.  Work note supplied.  Follow up with primary care as needed.   Final Clinical Impressions(s) / UC Diagnoses    Final diagnoses:  Viral URI  Discharge Instructions      We will contact you if your COVID test is positive.  Please quarantine while you wait for the results.  If your test is negative you may resume normal activities.  If your test is positive please continue to quarantine for at least 5 days from your symptom onset or until you are without a fever for at least 24 hours after the medications.  You can take Tylenol and/or Ibuprofen as needed for fever reduction and pain relief.   For cough: honey 1/2 to 1 teaspoon (you can dilute the honey in water or another fluid).  You can also use guaifenesin and dextromethorphan for cough. You can use a humidifier for chest congestion and cough.  If you don't have a humidifier, you can sit in the bathroom with the hot shower running.     For sore throat: try warm salt water gargles, cepacol lozenges, throat spray, warm tea or water with lemon/honey, popsicles or ice, or OTC cold relief medicine for throat discomfort.    For congestion: take a daily anti-histamine like Zyrtec, Claritin, and a oral decongestant, such as pseudoephedrine.  You can also use Flonase 1-2 sprays in each nostril daily.    It is important to stay hydrated: drink plenty of fluids (water, gatorade/powerade/pedialyte, juices, or teas) to keep your throat moisturized and help further relieve irritation/discomfort.   Return or go to the Emergency Department if symptoms worsen or do not improve in the next few days.      ED Prescriptions   None    PDMP not reviewed this encounter.   Ivette Loyal, NP 10/01/20 [phone]

## 2020-10-01 NOTE — Discharge Instructions (Addendum)

## 2020-10-01 NOTE — Patient Instructions (Signed)
Thank you for coming to see me today. It was a pleasure. Today we discussed your vaginal discharge. It is most likely a yeast infection. I recommend   Please follow-up with your PCP as needed.   If you have any questions or concerns, please do not hesitate to call the office at [phone].  Best wishes,   Dr Allena Katz

## 2020-10-02 LAB — SARS CORONAVIRUS 2 (TAT 6-24 HRS): SARS Coronavirus 2: POSITIVE — AB

## 2020-10-04 LAB — CERVICOVAGINAL ANCILLARY ONLY
Chlamydia: NEGATIVE
Comment: NEGATIVE
Comment: NORMAL
Neisseria Gonorrhea: NEGATIVE

## 2021-03-11 ENCOUNTER — Other Ambulatory Visit (HOSPITAL_COMMUNITY)
Admission: RE | Admit: 2021-03-11 | Discharge: 2021-03-11 | Disposition: A | Discharge status: Home / Self Care | Payer: Medicaid Other | Source: Ambulatory Visit | Attending: Family Medicine | Admitting: Family Medicine

## 2021-03-11 ENCOUNTER — Other Ambulatory Visit: Payer: Self-pay

## 2021-03-11 ENCOUNTER — Encounter: Payer: Self-pay | Admitting: Family Medicine

## 2021-03-11 ENCOUNTER — Ambulatory Visit (INDEPENDENT_AMBULATORY_CARE_PROVIDER_SITE_OTHER): Payer: Self-pay | Admitting: Family Medicine

## 2021-03-11 VITALS — BP 101/80 | HR 80 | Wt 179.2 lb

## 2021-03-11 DIAGNOSIS — Z113 Encounter for screening for infections with a predominantly sexual mode of transmission: Secondary | ICD-10-CM

## 2021-03-11 NOTE — Progress Notes (Signed)
    SUBJECTIVE:   CHIEF COMPLAINT: STI testing HPI:   April [name] is a 30 y.o.  with history notable for obesity presenting for STI testing.  Patient reports she recently had a new partner and would like to know her infection status today.  She denies that her partner had any known infections.  She reports she is currently on her menses.  She did note some white discharge prior to this.  She denies pruritus dysuria or other symptoms.  She reports having 1 partner and this is the reason she wants testing.  She does not take a prenatal use contraception.  She reports she would be okay with having a child in the next year.  She stopped any other medications currently.  She reports overall she is doing well.Marland Kitchen   PERTINENT  PMH / PSH/Family/Social History : Updated and reviewed as appropriate  OBJECTIVE:   BP 101/80   Pulse 80   Wt 179 lb 3.2 oz (81.3 kg)   LMP 03/10/2021 (Exact Date)   SpO2 98%   BMI 35.00 kg/m   Today's weight:  Last Weight  Most recent update: 03/11/2021 10:58 AM    Weight  81.3 kg (179 lb 3.2 oz)            Review of prior weights: Filed Weights   03/11/21 1058  Weight: 179 lb 3.2 oz (81.3 kg)    Cardiac: Warm well perfused.  Capillary refill less than 3 seconds Respiratory breathing comfortably on room air Psych: Pleasant normal affect, appropriate, normal rate of speech GU Exam:  Chaperoned exam.  External exam: Normal-appearing female external genitalia.  Vaginal exam notable for small about of blood .  Cervix without discharge or obvious lesion. ASSESSMENT/PLAN:   Screen for STD (sexually transmitted disease) Routine testing collected today.  Will let patient know results.  She is asymptomatic.   Preconception counseling, recommend prenatal vitamin, discussed.    Terisa Starr, MD  Family Medicine Teaching Service  [facility]

## 2021-03-11 NOTE — Patient Instructions (Signed)
It was wonderful to see you today.  Please bring ALL of your medications with you to every visit.   Today we talked about:   - Obtaining blood and swabs for testing for infection  --I will call you with results  --Think about the flu shot  -  Please take a prenatal daily      Thank you for choosing [facility].   Please call [phone] with any questions about today's appointment.  Please be sure to schedule follow up at the front  desk before you leave today.   Terisa Starr, MD  Family Medicine

## 2021-03-11 NOTE — Assessment & Plan Note (Signed)
Routine testing collected today.  Will let patient know results.  She is asymptomatic.

## 2021-03-12 ENCOUNTER — Encounter: Payer: Self-pay | Admitting: Family Medicine

## 2021-03-12 LAB — HIV ANTIBODY (ROUTINE TESTING W REFLEX): HIV Screen 4th Generation wRfx: NONREACTIVE

## 2021-03-12 LAB — HEPATITIS B SURFACE ANTIGEN: Hepatitis B Surface Ag: NEGATIVE

## 2021-03-12 LAB — RPR: RPR Ser Ql: NONREACTIVE

## 2021-03-12 LAB — HCV AB W REFLEX TO QUANT PCR: HCV Ab: 0.2 s/co ratio (ref 0.0–0.9)

## 2021-03-12 LAB — HCV INTERPRETATION

## 2021-03-14 ENCOUNTER — Telehealth: Payer: Self-pay | Admitting: Family Medicine

## 2021-03-14 DIAGNOSIS — B9689 Other specified bacterial agents as the cause of diseases classified elsewhere: Secondary | ICD-10-CM

## 2021-03-14 DIAGNOSIS — N760 Acute vaginitis: Secondary | ICD-10-CM

## 2021-03-14 LAB — CERVICOVAGINAL ANCILLARY ONLY
Bacterial Vaginitis (gardnerella): POSITIVE — AB
Candida Glabrata: NEGATIVE
Candida Vaginitis: NEGATIVE
Chlamydia: NEGATIVE
Comment: NEGATIVE
Comment: NEGATIVE
Comment: NEGATIVE
Comment: NEGATIVE
Comment: NEGATIVE
Comment: NORMAL
Neisseria Gonorrhea: NEGATIVE
Trichomonas: NEGATIVE

## 2021-03-14 MED ORDER — METRONIDAZOLE 500 MG PO TABS
500.0000 mg | ORAL_TABLET | Freq: Two times a day (BID) | ORAL | 0 refills | Status: AC
Start: 2021-03-14 — End: 2021-03-21

## 2021-03-14 MED ORDER — ONDANSETRON 4 MG PO TBDP: 4.0000 mg | ORAL_TABLET | Freq: Three times a day (TID) | ORAL | 0 refills | Status: DC | PRN

## 2021-03-14 NOTE — Telephone Encounter (Signed)
Called with results and treated for BV.   All questions answered.  Terisa Starr, MD  Family Medicine Teaching Service

## 2021-04-10 NOTE — L&D Delivery Note (Signed)
   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 Brief report received from L. Leftwich-Kirby CNM "patient 9cm feeling the urge to push. She also noted a 9cm Liver mass, and informed that patient is not to have any fundal pressure during delivery."  Patient Pushing in lithotomy position with CNM and L&D staff support at bedside.  Friends of patient present for birth and supportive.   Delivery of a Live born female  Birth Weight:  PENDING  APGAR: 70, 9  Newborn Delivery   Birth date/time: 01/07/2022 00:35:00 Delivery type: Vaginal, Spontaneous      With excellent maternal pushing efforts patient pushed once. Fetal head expelled spontaneously in cephalic presentation, position DOA and remaining fetal body quickly followed with ease.  Nuchal Cord: No    After 5 mins of life cord double clamped, and cut by CNM. CNM left enough cord for FOB to cut upon his arrival.  Collection of cord blood for typing completed. Cord blood donation-None  Arterial cord blood sample-No    Third Stage:  Placenta delivered with gentle cord traction and maternal pushing efforts-Spontaneous intact with 3 vessels . LUS firm via blind vaginal sweep. Bleeding minimal Uterotonics: IM Pit and IM Methergine given due to liver mass and no abdominal fundal assessment.   Placenta to L&D for disposal .    Clitoral laceration identified and hemostatic .  Episiotomy:None  Local analgesia: n/a   Repair: N/A  Est. Blood Loss (KZ):601 Complications: None   Mom  to postpartum.  Baby girl to Couplet care / Skin to Skin.  Delivery Report:  Review the Delivery Report for details.    Breon Rehm Isaias Sakai) Rollene Rotunda, MSN, [facility]  01/07/22 1:01 AM

## 2021-04-27 ENCOUNTER — Other Ambulatory Visit: Payer: Self-pay

## 2021-04-27 ENCOUNTER — Encounter: Payer: Self-pay | Admitting: Student

## 2021-04-27 ENCOUNTER — Ambulatory Visit (INDEPENDENT_AMBULATORY_CARE_PROVIDER_SITE_OTHER): Payer: Self-pay | Admitting: Student

## 2021-04-27 VITALS — BP 98/86 | HR 79 | Wt 182.4 lb

## 2021-04-27 DIAGNOSIS — B3731 Acute candidiasis of vulva and vagina: Secondary | ICD-10-CM

## 2021-04-27 LAB — POCT WET PREP (WET MOUNT)
Clue Cells Wet Prep Whiff POC: NEGATIVE
Trichomonas Wet Prep HPF POC: ABSENT

## 2021-04-27 MED ORDER — FLUCONAZOLE 150 MG PO TABS: 150.0000 mg | ORAL_TABLET | Freq: Once | ORAL | 0 refills | Status: AC

## 2021-04-27 NOTE — Addendum Note (Signed)
Addended by: Pamelia Hoit on: 04/27/2021 12:02 PM   Modules accepted: Orders

## 2021-04-27 NOTE — Progress Notes (Signed)
° ° °  SUBJECTIVE:   CHIEF COMPLAINT / HPI:   Concern for BV: Patient is a 31 y.o. female presenting with  concern for recurrent BV. She was recently treated for BV last month, but says that she "still feels like I have BV." She is not having any increased discharge, odor or pain, but would like to repeat a wet prep today. She is sexually active with one female partner and does not use condoms. She is open to pregnancy. She denies any new sexual partners and does not feel that STI screening is warranted today.    OBJECTIVE:   BP 98/86    Pulse 79    Wt 182 lb 6.4 oz (82.7 kg)    SpO2 100%    BMI 35.62 kg/m    General: NAD, pleasant, able to participate in exam Respiratory: Normal effort, no obvious respiratory distress Pelvic: VULVA: normal appearing vulva with no masses, tenderness or lesions, VAGINA: Normal appearing vagina with normal color, no lesions, with scant and white discharge present CERVIX: No lesions, thin discharge present,  Chaperone present for pelvic exam  ASSESSMENT/PLAN:   Vaginal candida Wet prep today without evidence of BV but with + Yeast buds.  - Will treat with Diflucan x1 - Continue to encourage PNV while open to pregnancy and having unprotected sex    Dorothyann Gibbs, MD [facility]

## 2021-04-27 NOTE — Assessment & Plan Note (Addendum)
Wet prep today without evidence of BV but with + Yeast buds.  - Will treat with Diflucan x1 - Continue to encourage PNV while open to pregnancy and having unprotected sex

## 2021-04-27 NOTE — Patient Instructions (Signed)
April [name], It is such a joy to take care you! Thank you for coming in today.   As a reminder, here is a recap of what we talked about today:  - It looks like you have a yeast infection. I've placed an order for a one time dose of Diflucan. This should treat your infection. Please come back if you have any new or worsening symptoms!   Take care and seek immediate care sooner if you develop any concerns.   Eliezer Mccoy, MD [facility]

## 2021-05-19 ENCOUNTER — Other Ambulatory Visit: Payer: Self-pay

## 2021-05-19 ENCOUNTER — Other Ambulatory Visit (HOSPITAL_COMMUNITY)
Admission: RE | Admit: 2021-05-19 | Discharge: 2021-05-19 | Disposition: A | Discharge status: Home / Self Care | Payer: Medicaid Other | Source: Ambulatory Visit | Attending: Family Medicine | Admitting: Family Medicine

## 2021-05-19 ENCOUNTER — Ambulatory Visit (INDEPENDENT_AMBULATORY_CARE_PROVIDER_SITE_OTHER): Payer: Self-pay | Admitting: Family Medicine

## 2021-05-19 VITALS — BP 113/73 | HR 89 | Ht 60.0 in | Wt 185.1 lb

## 2021-05-19 DIAGNOSIS — N898 Other specified noninflammatory disorders of vagina: Secondary | ICD-10-CM | POA: Diagnosis not present

## 2021-05-19 DIAGNOSIS — Z3201 Encounter for pregnancy test, result positive: Secondary | ICD-10-CM

## 2021-05-19 DIAGNOSIS — N926 Irregular menstruation, unspecified: Secondary | ICD-10-CM

## 2021-05-19 LAB — POCT WET PREP (WET MOUNT)
Clue Cells Wet Prep Whiff POC: NEGATIVE
Trichomonas Wet Prep HPF POC: ABSENT

## 2021-05-19 LAB — POCT URINE PREGNANCY: Preg Test, Ur: POSITIVE — AB

## 2021-05-19 MED ORDER — MONISTAT 1 COMBO PACK 1200 & 2 MG & % VA KIT: 1.0000 | PACK | Freq: Once | VAGINAL | 0 refills | Status: AC

## 2021-05-19 NOTE — Progress Notes (Signed)
° ° °  SUBJECTIVE:   CHIEF COMPLAINT / HPI:   Patient presents for increased vaginal discharge.  She has not had any irritation or odor.  Patient also notes that she is not currently on birth control, her last menstrual cycle was on January 1.  She has been having unprotected sex with her husband, they are not necessarily trying to get pregnant but not stopping anything either.  PERTINENT  PMH / PSH: Reviewed  OBJECTIVE:   BP 113/73    Pulse 89    Ht 5' (1.524 m)    Wt 185 lb 2 oz (84 kg)    LMP 04/10/2021    SpO2 100%    BMI 36.15 kg/m   General: NAD, well-appearing, well-nourished Respiratory: No respiratory distress, breathing comfortably, able to speak in full sentences Skin: warm and dry, no rashes noted on exposed skin Psych: Appropriate affect and mood Pelvic exam: VULVA: normal appearing vulva with no masses, tenderness or lesions, VAGINA: normal appearing vagina with normal color and discharge, no lesions, CERVIX: cervical discharge present - creamy and green, WET MOUNT done - results: KOH done, lactobacilli, exam chaperoned by Aquilla Solian, CMA.   ASSESSMENT/PLAN:   Positive pregnancy test LMP was around the beginning of the year, patient plans to keep the pregnancy and is overall happy about the pregnancy, though a bit shocked.  - Ob labs to be collected on 2/15 (will future order these) - Initial Ob appointment 2/21  Vaginal discharge   Yeast infection Yeast present on wet prep, patient currently pregnant - Monistat OTC recommended   Evelena Leyden, DO [facility]

## 2021-05-19 NOTE — Patient Instructions (Addendum)
It was so great seeing you today! Today we discussed the following:  - Your pregnancy test today was positive. You can come to get your labs and urine tests done on the 15th. Your initial Ob appointment will be on the 21st at 10:50am  - You did have yeast on your test today, I am sending in for the vaginal cream (Monistat) for treatment that you can also get over the counter.   Please make sure to bring any medications you take to your appointments. If you have any questions or concerns please call the office at [phone].

## 2021-05-23 ENCOUNTER — Other Ambulatory Visit: Payer: Self-pay | Admitting: *Deleted

## 2021-05-23 DIAGNOSIS — Z3481 Encounter for supervision of other normal pregnancy, first trimester: Secondary | ICD-10-CM

## 2021-05-23 LAB — CERVICOVAGINAL ANCILLARY ONLY
Chlamydia: NEGATIVE
Comment: NEGATIVE
Comment: NEGATIVE
Comment: NORMAL
Neisseria Gonorrhea: NEGATIVE
Trichomonas: NEGATIVE

## 2021-05-25 ENCOUNTER — Other Ambulatory Visit: Payer: Medicaid Other

## 2021-05-25 ENCOUNTER — Other Ambulatory Visit: Payer: Self-pay

## 2021-05-25 DIAGNOSIS — Z3481 Encounter for supervision of other normal pregnancy, first trimester: Secondary | ICD-10-CM

## 2021-05-27 DIAGNOSIS — Z3490 Encounter for supervision of normal pregnancy, unspecified, unspecified trimester: Secondary | ICD-10-CM | POA: Insufficient documentation

## 2021-05-27 NOTE — Patient Instructions (Addendum)
It was wonderful to see you today.  Please bring ALL of your medications with you to every visit.   Today we talked about:  It is important to take your prenatal vitamins daily!   We are doing routine testing for sexually transmitted infections today as well as checking for bacterial vaginosis and yeast infections, I will let you know the results.   We are doing an early 1-hour screening for pre-existing diabetes. You will need to have a repeat test done around 24 weeks to check for pregnancy-induced diabetes.   If you experience any vaginal bleeding, leakage of fluids, please go directly to the Maternal Assessment Unit at [facility] for evaluation.  Maternity and women's care services located on the [place] side of [facility] [place]. [facility] (Entrance C off [address]).  [address] Entr[address] [place],  [place]  [address]      Thank you for choosing [facility].   Please call [phone] with any questions about today's appointment.  Please be sure to schedule follow up at the front  desk before you leave today.   Sabino Dick, DO PGY-2 Family Medicine

## 2021-05-27 NOTE — Progress Notes (Addendum)
Patient Name: April [name] Date of Birth: [birth year] [place] Initial Prenatal Visit  April [name] is a 31 y.o. year old G1P1001 at Unknown who presents for her initial prenatal visit. Pregnancy is planned She reports  none . She is taking a prenatal vitamin.  She denies pelvic pain. Did state that she had a little vaginal bleeding last week after intercourse. Noticed when she wiped afterwards. It did not continue after that. About two weeks ago also had some spotting. She had a yeast infection as well- thought it was connected. Hasn't had to wear a pad- only noticed after wiping.  Pregnancy Dating: The patient is dated by LMP.  LMP: 123456  Period is certain:  Yes.  Periods were regular:  Yes.  LMP was a typical period:  Yes.  Using hormonal contraception in 3 months prior to conception: No  Lab Review: Blood type: A Rh Status: + Antibody screen: Negative HIV: Negative RPR: Negative Hemoglobin electrophoresis reviewed: Not yet available. Results of OB urine culture are: Negative Rubella: Immune Hep C Ab: Negative Varicella status is Believes she has been vaccinated.   PMH: Reviewed and as detailed below: HTN: No  Gestational Hypertension/preeclampsia: No  Type 1 or 2 Diabetes: No  Depression:  No  Seizure disorder:  No VTE: No ,  History of STI Yes, has had chlamydia and gonorrhea. Abnormal Pap smear:  No, Genital herpes simplex:  No   PSH: Gynecologic Surgery:  no Surgical history reviewed, notable for: None  Obstetric History: Obstetric history tab updated and reviewed.  Summary of prior pregnancies: 02/26/2014: NSVD at [redacted]w[redacted]d to healthy female. Birth weight 6 lb 12.2 oz (3067 g), APGAR's 9, 9.  Cesarean delivery: No  Gestational Diabetes:  No Hypertension in pregnancy: No History of preterm birth: No History of LGA/SGA infant:  No History of shoulder dystocia: No Indications for referral were reviewed, and the patient has no  obstetric indications for referral to [facility] at this time.   Social History: Partner's name: Engineer, manufacturing   Tobacco use: No Alcohol use:  No Other substance use:  No  Current Medications:  Prenatal Vitamins Reviewed and appropriate in pregnancy.   Genetic and Infection Screen: Flow Sheet Updated Yes  Prenatal Exam: Gen: Well nourished, well developed.  No distress.  Vitals noted. HEENT: Normocephalic, atraumatic.  Neck supple without cervical lymphadenopathy, thyromegaly or thyroid nodules.  Fair dentition, tongue piercing. CV: RRR no murmur, gallops or rubs Lungs: CTA B.  Normal respiratory effort without wheezes or rales. Abd: soft. Uterus not appreciated above pelvis. GU: Normal external female genitalia without lesions.  Nl vaginal, well rugated without lesions. Mild white and clear vaginal discharge.  Bimanual exam: No adnexal mass or TTP. No CMT.  Uterus size small. Ext: No clubbing, cyanosis or edema. Psych: Normal grooming and dress.  Not depressed or anxious appearing.  Normal thought content and process without flight of ideas or looseness of associations  Fetal heart tones: Not performed today as she is only 7 weeks.  Depression screen [facility] 2/9 05/31/2021 05/19/2021 04/27/2021  Decreased Interest 0 0 0  Down, Depressed, Hopeless 0 0 0  PHQ - 2 Score 0 0 0  Altered sleeping 0 0 0  Tired, decreased energy 0 0 0  Change in appetite 0 0 0  Feeling bad or failure about yourself  0 0 0  Trouble concentrating 0 0 0  Moving slowly or fidgety/restless 0 0 0  Suicidal thoughts 0  0 0  PHQ-9 Score 0 0 0  Difficult doing work/chores - - -  Some recent data might be hidden   Assessment/Plan:  April [name] is a 31 y.o. G1P1001 at Unknown who presents to initiate prenatal care. She is doing well.  Current pregnancy issues include None.  Routine prenatal care: Dating is reliable, however, a dating ultrasound has been ordered (given patient preference and  history of some vaginal bleeding). Dating tab updated. Pre-pregnancy weight updated. Expected weight gain this pregnancy is 11-20 pounds  Prenatal labs reviewed, notable for Rubella immune, A+ antibody negative, normal Hgb. Indications for referral to [facility] were reviewed and the patient does not meet criteria for referral.  Medication list reviewed and updated.  Recommended patient see a dentist for regular care.  Bleeding and pain precautions reviewed. Importance of prenatal vitamins reviewed.  Genetic screening offered. Patient opted for: no screening. The patient has the following indications for aspirinto begin 81 mg at 12-16 weeks: One high risk condition: no single high risk condition  MORE than one moderate risk condition: obesity and identifies as African American  Aspirin was not  recommended today given gestational age, but should be recommended once over [redacted] weeks gestation based upon above risk factors (one high risk condition or more than one moderate risk factor)  The patient will not be age 60 or over at time of delivery. Referral to genetic counseling was not offered today.  The patient has the following risk factors for preexisting diabetes: BMI > 25 and high risk ethnicity (Latino, [place] American, Native American, [place], Asian Optometrist) . An early 1 hour glucose tolerance test was ordered. Pregnancy Medical Home and PHQ-9 forms completed, problems noted: Yes  2. Pregnancy issues include the following which were addressed today:  BMI: 35; early 1-hour glucola today (passed 132). Discussed weight recommendations.  Requests dating ultrasound: given history of slight spotting during pregnancy, feel this is also reasonable.  Declined Flu and COVID vaccines. She is hesitant because she has an egg allergy though discussed vaccination is still recommended. Has not been hospitalized for allergy and does not have an Epi pen but notes throat closing up and shortness of breath.  She also mentioned allergies to the "aroma" of fish, and nuts. She may benefit from allergy testing after delivery. +BV and yeast: Rx for metronidazole and miconazole. MyChart message sent.   Follow up 4 weeks for next prenatal visit.

## 2021-05-31 ENCOUNTER — Encounter: Payer: Self-pay | Admitting: Family Medicine

## 2021-05-31 ENCOUNTER — Other Ambulatory Visit (HOSPITAL_COMMUNITY)
Admission: RE | Admit: 2021-05-31 | Discharge: 2021-05-31 | Disposition: A | Discharge status: Home / Self Care | Payer: Medicaid Other | Source: Ambulatory Visit | Attending: Family Medicine | Admitting: Family Medicine

## 2021-05-31 ENCOUNTER — Other Ambulatory Visit: Payer: Self-pay

## 2021-05-31 ENCOUNTER — Ambulatory Visit (INDEPENDENT_AMBULATORY_CARE_PROVIDER_SITE_OTHER): Payer: Self-pay | Admitting: Family Medicine

## 2021-05-31 ENCOUNTER — Other Ambulatory Visit: Payer: Self-pay | Admitting: Family Medicine

## 2021-05-31 DIAGNOSIS — Z3480 Encounter for supervision of other normal pregnancy, unspecified trimester: Secondary | ICD-10-CM | POA: Diagnosis not present

## 2021-05-31 LAB — POCT 1 HR PRENATAL GLUCOSE: Glucose 1 Hr Prenatal, POC: 132 mg/dL

## 2021-05-31 LAB — POCT WET PREP (WET MOUNT)
Clue Cells Wet Prep Whiff POC: POSITIVE
Trichomonas Wet Prep HPF POC: ABSENT

## 2021-05-31 MED ORDER — METRONIDAZOLE 500 MG PO TABS: 500.0000 mg | ORAL_TABLET | Freq: Two times a day (BID) | ORAL | 0 refills | Status: DC

## 2021-05-31 MED ORDER — MICONAZOLE 3 APPLICATOR 200 & 2 MG-% (9GM) VA KIT: 1.0000 | PACK | Freq: Every day | VAGINAL | 0 refills | Status: AC

## 2021-05-31 NOTE — Progress Notes (Signed)
+  BV and yeast while pregnant- will treat.

## 2021-06-01 LAB — CBC/D/PLT+RPR+RH+ABO+RUBIGG...
Antibody Screen: NEGATIVE
Basophils Absolute: 0 10*3/uL (ref 0.0–0.2)
Basos: 1 %
Bilirubin, UA: NEGATIVE
EOS (ABSOLUTE): 0.2 10*3/uL (ref 0.0–0.4)
Eos: 2 %
Glucose, UA: NEGATIVE
HCV Ab: NONREACTIVE
HIV Screen 4th Generation wRfx: NONREACTIVE
Hematocrit: 35.3 % (ref 34.0–46.6)
Hemoglobin: 11.9 g/dL (ref 11.1–15.9)
Hepatitis B Surface Ag: NEGATIVE
Immature Grans (Abs): 0 10*3/uL (ref 0.0–0.1)
Immature Granulocytes: 0 %
Lymphocytes Absolute: 2.9 10*3/uL (ref 0.7–3.1)
Lymphs: 37 %
MCH: 29 pg (ref 26.6–33.0)
MCHC: 33.7 g/dL (ref 31.5–35.7)
MCV: 86 fL (ref 79–97)
Monocytes Absolute: 0.6 10*3/uL (ref 0.1–0.9)
Monocytes: 8 %
Neutrophils Absolute: 4.1 10*3/uL (ref 1.4–7.0)
Neutrophils: 52 %
Nitrite, UA: NEGATIVE
Platelets: 360 10*3/uL (ref 150–450)
Protein,UA: NEGATIVE
RBC, UA: NEGATIVE
RBC: 4.1 x10E6/uL (ref 3.77–5.28)
RDW: 13.4 % (ref 11.7–15.4)
RPR Ser Ql: NONREACTIVE
Rh Factor: POSITIVE
Rubella Antibodies, IGG: 6.01 index (ref >0.99)
Specific Gravity, UA: 1.018 (ref 1.005–1.030)
Urobilinogen, Ur: 0.2 mg/dL (ref 0.2–1.0)
WBC: 7.8 10*3/uL (ref 3.4–10.8)
pH, UA: 7 (ref 5.0–7.5)

## 2021-06-01 LAB — HGB FRACTIONATION BY HPLC
Hgb A: 97.4 % (ref 96.4–98.8)
Hgb C: 0 %
Hgb E: 0 %
Hgb F: 0 % (ref 0.0–2.0)
Hgb S: 0 %
Hgb Variant: 1.2 % — ABNORMAL HIGH

## 2021-06-01 LAB — HCV INTERPRETATION

## 2021-06-01 LAB — MICROSCOPIC EXAMINATION
Casts: NONE SEEN /lpf
Epithelial Cells (non renal): >10 /hpf — AB (ref 0–10)

## 2021-06-01 LAB — CERVICOVAGINAL ANCILLARY ONLY
Chlamydia: NEGATIVE
Comment: NEGATIVE
Comment: NORMAL
Neisseria Gonorrhea: NEGATIVE

## 2021-06-01 LAB — HGB FRACTIONATION CASCADE: Hgb A2: 1.4 % — ABNORMAL LOW (ref 1.8–3.2)

## 2021-06-01 LAB — URINE CULTURE, OB REFLEX

## 2021-06-02 ENCOUNTER — Ambulatory Visit: Payer: Medicaid Other | Admitting: *Deleted

## 2021-06-02 ENCOUNTER — Other Ambulatory Visit: Payer: Self-pay

## 2021-06-02 ENCOUNTER — Ambulatory Visit: Payer: Self-pay | Attending: Family Medicine

## 2021-06-02 VITALS — BP 105/56 | HR 73

## 2021-06-02 DIAGNOSIS — Z3480 Encounter for supervision of other normal pregnancy, unspecified trimester: Secondary | ICD-10-CM

## 2021-06-02 DIAGNOSIS — O4691 Antepartum hemorrhage, unspecified, first trimester: Secondary | ICD-10-CM

## 2021-06-02 DIAGNOSIS — O99211 Obesity complicating pregnancy, first trimester: Secondary | ICD-10-CM | POA: Insufficient documentation

## 2021-06-02 DIAGNOSIS — O209 Hemorrhage in early pregnancy, unspecified: Secondary | ICD-10-CM | POA: Insufficient documentation

## 2021-06-02 DIAGNOSIS — Z3A01 Less than 8 weeks gestation of pregnancy: Secondary | ICD-10-CM | POA: Insufficient documentation

## 2021-06-02 DIAGNOSIS — Z3491 Encounter for supervision of normal pregnancy, unspecified, first trimester: Secondary | ICD-10-CM

## 2021-06-03 ENCOUNTER — Telehealth: Payer: Self-pay

## 2021-06-03 ENCOUNTER — Other Ambulatory Visit: Payer: Self-pay | Admitting: Family Medicine

## 2021-06-03 MED ORDER — METRONIDAZOLE 0.75 % VA GEL: 1.0000 | Freq: Every day | VAGINAL | 0 refills | Status: AC

## 2021-06-03 NOTE — Telephone Encounter (Signed)
Patient calls nurse line wanting to clarify if Tylenol was safe to take during pregnancy. Patient advised Tylenol is perfectly safe and to follow dosing directions on bottle.   In addition, patient would like an alternative for Flagyl. Patient reports Flagyl leaves an "unwanted" taste in her mouth.  Will forward to provider who saw patient.

## 2021-06-03 NOTE — Progress Notes (Signed)
Sending metronidazole topical intravaginal gel as patient is having difficulty with PO flagyl.

## 2021-06-03 NOTE — Telephone Encounter (Signed)
Rx sent for intravaginal metronidazole. Discussed with patient.

## 2021-06-15 ENCOUNTER — Other Ambulatory Visit: Payer: Self-pay

## 2021-06-15 ENCOUNTER — Inpatient Hospital Stay (HOSPITAL_COMMUNITY)
Admission: AD | Admit: 2021-06-15 | Discharge: 2021-06-15 | Disposition: A | Discharge status: Home / Self Care | Payer: Medicaid Other | Attending: Obstetrics and Gynecology | Admitting: Obstetrics and Gynecology

## 2021-06-15 ENCOUNTER — Encounter (HOSPITAL_COMMUNITY): Payer: Self-pay | Admitting: Obstetrics and Gynecology

## 2021-06-15 DIAGNOSIS — Z3491 Encounter for supervision of normal pregnancy, unspecified, first trimester: Secondary | ICD-10-CM

## 2021-06-15 DIAGNOSIS — B9689 Other specified bacterial agents as the cause of diseases classified elsewhere: Secondary | ICD-10-CM

## 2021-06-15 DIAGNOSIS — R109 Unspecified abdominal pain: Secondary | ICD-10-CM | POA: Insufficient documentation

## 2021-06-15 DIAGNOSIS — Z3A09 9 weeks gestation of pregnancy: Secondary | ICD-10-CM | POA: Insufficient documentation

## 2021-06-15 DIAGNOSIS — N760 Acute vaginitis: Secondary | ICD-10-CM

## 2021-06-15 DIAGNOSIS — O26891 Other specified pregnancy related conditions, first trimester: Secondary | ICD-10-CM | POA: Insufficient documentation

## 2021-06-15 LAB — URINALYSIS, ROUTINE W REFLEX MICROSCOPIC
Bilirubin Urine: NEGATIVE
Glucose, UA: NEGATIVE mg/dL
Hgb urine dipstick: NEGATIVE
Ketones, ur: 20 mg/dL — AB
Nitrite: NEGATIVE
Protein, ur: 30 mg/dL — AB
Specific Gravity, Urine: 1.032 — ABNORMAL HIGH (ref 1.005–1.030)
pH: 6 (ref 5.0–8.0)

## 2021-06-15 NOTE — MAU Note (Signed)
April [name] is a 31 y.o. at [redacted]w[redacted]d here in MAU reporting: was sent in a Rx for metronidazole, states she felt anxious about the medication so she did not finish the prescription. Has continued to have some cramping. Denies discharge or bleeding. ? ?Onset of complaint: ongoing ? ?Pain score: 3/10 ? ?Vitals:  ? 06/15/21 1436  ?BP: (!) 127/57  ?Pulse: 92  ?Resp: 16  ?Temp: 98.4 ?F (36.9 ?C)  ?SpO2: 97%  ?   ?Lab orders placed from triage: UA ? ?

## 2021-06-15 NOTE — MAU Provider Note (Signed)
?History  ?  ? ?CSN: [id number] ? ?Arrival date and time: 06/15/21 1420 ? ? Event Date/Time  ? First Provider Initiated Contact with Patient 06/15/21 1455   ?  ? ?Chief Complaint  ?Patient presents with  ? Abdominal Pain  ? ?HPI ?April [name] is a 31 y.o. G2P1001 at [redacted]w[redacted]d who presents to MAU with chief complaint of abdominal "cramping. This is a recurrent problem. Patient states she was told she has Bacterial Vaginosis but the idea of taking medicine which might harm her baby makes her "anxious". She did not finish the PO Flagyl she was prescribed and has not initiated the vaginal cream she was prescribed as an alternative. Her pain is suprapubic, does not radiate. Pain score is 3/10. She denies vaginal bleeding, dysuria, fever or recent illness. ? ?OB History   ? ? Gravida  ?2  ? Para  ?1  ? Term  ?1  ? Preterm  ?0  ? AB  ?0  ? Living  ?1  ?  ? ? SAB  ?0  ? IAB  ?0  ? Ectopic  ?0  ? Multiple  ?0  ? Live Births  ?1  ?   ?  ?  ? ? ?Past Medical History:  ?Diagnosis Date  ? Medical history non-contributory   ? ? ?Past Surgical History:  ?Procedure Laterality Date  ? NO PAST SURGERIES    ? [place] EXTRACTION  2013  ? ? ?Family History  ?Problem Relation Age of Onset  ? Diabetes Maternal Aunt   ? Diabetes Maternal Uncle   ? Hypertension Maternal Uncle   ? Diabetes Maternal Grandmother   ? Hypertension Maternal Grandmother   ? Cancer Maternal Grandfather   ? Hearing loss Neg Hx   ? ? ?Social History  ? ?Tobacco Use  ? Smoking status: Never  ? Smokeless tobacco: Never  ?Vaping Use  ? Vaping Use: Never used  ?Substance Use Topics  ? Alcohol use: No  ? Drug use: No  ? ? ?Allergies:  ?Allergies  ?Allergen Reactions  ? Eggs Or Egg-Derived Products Hives and Swelling  ?   Mother and patient state swelling of throat, itching of throat, hives.  ? Peanut-Containing Drug Products Anaphylaxis  ?  All nut products per patient  ? Fish Allergy Hives  ? ? ?Medications Prior to Admission  ?Medication Sig Dispense Refill Last Dose   ? prenatal vitamin w/FE, FA (NATACHEW) 29-1 MG CHEW chewable tablet Chew 1 tablet by mouth daily at 12 noon.   06/15/2021  ? ? ?Review of Systems  ?Gastrointestinal:  Positive for abdominal pain.  ?All other systems reviewed and are negative. ?Physical Exam  ? ?Blood pressure (!) 127/57, pulse 92, temperature 98.4 ?F (36.9 ?C), temperature source Oral, resp. rate 16, height 5\' 1"  (1.549 m), weight 84.5 kg, last menstrual period 04/11/2021, SpO2 97 %. ? ?Physical Exam ?Vitals and nursing note reviewed. Exam conducted with a chaperone present.  ?Constitutional:   ?   Appearance: She is well-developed. She is not ill-appearing.  ?Cardiovascular:  ?   Rate and Rhythm: Normal rate.  ?   Heart sounds: Normal heart sounds.  ?Pulmonary:  ?   Effort: Pulmonary effort is normal.  ?   Breath sounds: Normal breath sounds.  ?Abdominal:  ?   Palpations: Abdomen is soft.  ?   Tenderness: There is no abdominal tenderness.  ?Neurological:  ?   Mental Status: She is alert.  ? ? ?MAU Course  ?Procedures ? ?MDM ?--  Bedside Ultrasound for FHR check: ?Patient informed that the ultrasound is considered a limited obstetric ultrasound and is not intended to be a complete ultrasound exam.  Patient also informed that the ultrasound is not being completed with the intent of assessing for fetal or placental anomalies or any pelvic abnormalities.  Explained that the purpose of today?s ultrasound is to assess for fetal heart rate.  Patient acknowledges the purpose of the exam and the limitations of the study.   ? ?--Reassured patient that medications prescribed thus far are safe in pregnancy ? ?-- Patient discharged home prior to receipt of UA results. She was called by CNM at 1612. Ua and indication for culture reviewed ? ?Orders Placed This Encounter  ?Procedures  ? Culture, OB Urine  ? Urinalysis, Routine w reflex microscopic Urine, Clean Catch  ? Discharge patient  ? ? ? ?Patient Vitals for the past 24 hrs: ? BP Temp Temp src Pulse Resp SpO2  Height Weight  ?06/15/21 1436 (!) 127/57 98.4 ?F (36.9 ?C) Oral 92 16 97 % -- --  ?06/15/21 1431 -- -- -- -- -- -- 5\' 1"  (1.549 m) 84.5 kg  ? ?Results for orders placed or performed during the hospital encounter of 06/15/21 (from the past 24 hour(s))  ?Urinalysis, Routine w reflex microscopic Urine, Clean Catch     Status: Abnormal  ? Collection Time: 06/15/21  3:28 PM  ?Result Value Ref Range  ? Color, Urine AMBER (A) YELLOW  ? APPearance HAZY (A) CLEAR  ? Specific Gravity, Urine 1.032 (H) 1.005 - 1.030  ? pH 6.0 5.0 - 8.0  ? Glucose, UA NEGATIVE NEGATIVE mg/dL  ? Hgb urine dipstick NEGATIVE NEGATIVE  ? Bilirubin Urine NEGATIVE NEGATIVE  ? Ketones, ur 20 (A) NEGATIVE mg/dL  ? Protein, ur 30 (A) NEGATIVE mg/dL  ? Nitrite NEGATIVE NEGATIVE  ? Leukocytes,Ua TRACE (A) NEGATIVE  ? RBC / HPF 0-5 0 - 5 RBC/hpf  ? WBC, UA 0-5 0 - 5 WBC/hpf  ? Bacteria, UA RARE (A) NONE SEEN  ? Squamous Epithelial / LPF 11-20 0 - 5  ? Mucus PRESENT   ? Non Squamous Epithelial 0-5 (A) NONE SEEN  ? ? ?Assessment and Plan  ?--31 y.o. G2P1001 at [redacted]w[redacted]d  ?--Live IUP visible on BSUS, + cardiac flicker ?--Encouraged patient to use miconazole as previously prescribed ?--Abnormal UA, will send culture ?--Discharge home in stable condition ? ?F/U: ?Next appointment with [facility] is 06/28/2021 ? ?Darlina Rumpf, CNM ?06/15/2021, 6:00 PM  ?

## 2021-06-22 ENCOUNTER — Ambulatory Visit (INDEPENDENT_AMBULATORY_CARE_PROVIDER_SITE_OTHER): Payer: Self-pay | Admitting: Family Medicine

## 2021-06-22 ENCOUNTER — Telehealth: Payer: Self-pay

## 2021-06-22 ENCOUNTER — Other Ambulatory Visit: Payer: Self-pay

## 2021-06-22 VITALS — BP 118/55 | HR 75 | Wt 186.4 lb

## 2021-06-22 DIAGNOSIS — N898 Other specified noninflammatory disorders of vagina: Secondary | ICD-10-CM | POA: Insufficient documentation

## 2021-06-22 LAB — POCT WET PREP (WET MOUNT)
Clue Cells Wet Prep Whiff POC: NEGATIVE
Trichomonas Wet Prep HPF POC: ABSENT

## 2021-06-22 MED ORDER — MICONAZOLE NITRATE 2 % EX CREA: 1.0000 "application " | TOPICAL_CREAM | Freq: Two times a day (BID) | CUTANEOUS | 0 refills | Status: AC

## 2021-06-22 NOTE — Assessment & Plan Note (Addendum)
-  wet prep obtained today positive for candida, miconazole prescribed and patient notified. Avoided fluconazole given pregnancy  ?-patient politely declined STD check ?

## 2021-06-22 NOTE — Patient Instructions (Signed)
It was great seeing you today! ? ?Today we discussed your vaginal itching, we have performed testing at your request. I will let you know of any abnormal results and prescribe treatment as appropriate.  ? ?Keep in mind of the reasons to go to the MAU: ?1. Vaginal bleeding ?2. Gush or leakage of fluid ?3. Severe belly/pelvic pain ? ?Please follow up at your next scheduled appointment on 3/21 at 130 pm with Dr. Idalia Needle, if anything arises between now and then, please don't hesitate to contact our office. ? ? ?Thank you for allowing [place] to be a part of your medical care! ? ?Thank you, ?Dr. Robyne Peers  ?

## 2021-06-22 NOTE — Progress Notes (Signed)
? ? ? ?  SUBJECTIVE:  ? ?CHIEF COMPLAINT / HPI:  ? ?Patient endorses vaginal itching and irritation, had BV recently and completed course of appropriate treatment. She was first prescribed the pills and stopped it at day 4 but she did complete course of her gel treatment. These symptoms started back up 2 days ago. She has tried to take probiotics to see if this has helped but it has not. She has occasional thin, white vaginal discharge. She wants to make sure that she gets checked out. She shares that she scheduled this visit specifically for her vaginal complaint and will follow up next week for her next OB visit.  ? ?OBJECTIVE:  ? ?BP (!) 118/55   Pulse 75   Wt 186 lb 6.4 oz (84.6 kg)   LMP 04/11/2021   BMI 35.22 kg/m?   ?General: Patient well-appearing, in no acute distress. ?Resp: normal work of breathing ?GU: normal vulva, white vaginal discharge noted with associated mild odor, normal cervix, no rashes or external lesions noted ? ?GU exam performed in the presence of chaperone.  ? ?ASSESSMENT/PLAN:  ? ?Vaginal irritation ?-wet prep obtained today positive for candida, miconazole prescribed and patient notified ?-patient politely declined STD check ? ?Current pregnancy  ?-Continue daily prenatal vitamin  ?-MAU precautions discussed ?-Follow up with Dr. Idalia Needle on 3/21 for next OB visit, will need to start aspirin at [redacted] weeks gestation  ? ?Reece Leader, DO ?[facility]  ?

## 2021-06-22 NOTE — Telephone Encounter (Signed)
Patient calls nurse line returning phone call to Dr. Robyne Peers from visit this AM.  ? ?Informed patient of message per result note. Patient has no questions at this time.  ? ?Veronda Prude, RN ? ?

## 2021-06-28 ENCOUNTER — Other Ambulatory Visit: Payer: Self-pay

## 2021-06-28 ENCOUNTER — Ambulatory Visit (INDEPENDENT_AMBULATORY_CARE_PROVIDER_SITE_OTHER): Payer: Self-pay | Admitting: Family Medicine

## 2021-06-28 VITALS — BP 116/68 | HR 70 | Wt 188.0 lb

## 2021-06-28 DIAGNOSIS — Z3481 Encounter for supervision of other normal pregnancy, first trimester: Secondary | ICD-10-CM

## 2021-06-28 NOTE — Progress Notes (Signed)
?  Patient Name: April [name] ?Date of Birth: 1991/03/12 ?[facility] Prenatal Visit ? ?April [name] is a 31 y.o. G2P1001 at [redacted]w[redacted]d here for routine follow up. She is dated by LMP.  She reports no complaints.  She denies vaginal bleeding.  See flow sheet for details. ? ?There were no vitals filed for this visit. ? ? ?A/P: Pregnancy at [redacted]w[redacted]d.  Doing well.   ? ?Routine Prenatal Care:  ?Dating reviewed, dating tab is correct ?Influenza vaccine not administered as patient declined, will continue to discuss.   ?COVID vaccination was discussed and declined  ?The patient has the following indication for screening preexisting diabetes: Already completed and normal BMI > 25 and high risk ethnicity (Latino, [place] American, Native American, [place] Islander, Asian Naval architect) . Early gtt passed at last visit  ?Anatomy ultrasound ordered to be scheduled at 18-20 weeks. ?Patient is interested in genetic screening and will complete today ?Pregnancy education including expected weight gain in pregnancy, OTC medication use, continued use of prenatal vitamin, smoking cessation if applicable, and nutrition in pregnancy.   ?Bleeding and pain precautions reviewed. ?The patient has the following indications for aspirinto begin 81 mg at 12-16 weeks: ?One high risk condition: no single high risk condition  ?MORE than one moderate risk condition: obesity and identifies as African American  ?Aspirin was  recommended today based upon above risk factors (one high risk condition or more than one moderate risk factor)  ? ?2. Pregnancy issues include the following and were addressed as appropriate today: ? ?Discussed aspirin, patient declines  ?Problem list  and pregnancy box updated: Yes.  ?Genetic screening performed today  ? ?Follow up 4 weeks. ?  ?

## 2021-06-28 NOTE — Patient Instructions (Addendum)
It was great seeing you today! ? ?You were seen for your 11 week OB visit. Glad that you are feeling well! Today we will perform genetic screening. I will call you if any results are abnormal.  ? ?Go to the MAU at [facility] [place] if: ?You have pain in your lower abdomen or pelvic area ?Your water breaks.  Sometimes it is a big gush of fluid, sometimes it is just a trickle that keeps getting your underwear wet or running down your legs ?You have vaginal bleeding.  ? ?Please check-out at the front desk before leaving the clinic. We will see you back in 4 weeks for your next OB visit, but if you need to be seen earlier than that for any new issues we're happy to fit you in, just give [place] a call! ? ?Feel free to call with any questions or concerns at any time, at [phone]. ?  ?Take care,  ?Dr. Cora Collum ?[facility]  ?

## 2021-07-02 ENCOUNTER — Encounter: Payer: Self-pay | Admitting: Family Medicine

## 2021-07-02 ENCOUNTER — Emergency Department (HOSPITAL_COMMUNITY)
Admission: EM | Admit: 2021-07-02 | Discharge: 2021-07-03 | Disposition: A | Discharge status: Home / Self Care | Payer: Self-pay | Attending: Emergency Medicine | Admitting: Emergency Medicine

## 2021-07-02 ENCOUNTER — Other Ambulatory Visit: Payer: Self-pay

## 2021-07-02 ENCOUNTER — Emergency Department (HOSPITAL_COMMUNITY): Payer: Self-pay

## 2021-07-02 DIAGNOSIS — Z91010 Allergy to peanuts: Secondary | ICD-10-CM | POA: Insufficient documentation

## 2021-07-02 DIAGNOSIS — M25562 Pain in left knee: Secondary | ICD-10-CM | POA: Insufficient documentation

## 2021-07-02 DIAGNOSIS — M79605 Pain in left leg: Secondary | ICD-10-CM

## 2021-07-02 MED ORDER — IBUPROFEN 400 MG PO TABS
400.0000 mg | ORAL_TABLET | Freq: Once | ORAL | Status: DC
  Filled 2021-07-02: qty 1

## 2021-07-02 MED ORDER — NAPROXEN 500 MG PO TABS: 500.0000 mg | ORAL_TABLET | Freq: Two times a day (BID) | ORAL | 0 refills | Status: DC | PRN

## 2021-07-02 NOTE — ED Notes (Signed)
Lt posterior knee pain for on month  no known injury   pain increasing   when she walks  she has a job walking ?

## 2021-07-02 NOTE — ED Provider Notes (Signed)
?[facility] EMERGENCY DEPARTMENT ?Provider Note ? ? ?CSN: [id number] ?Arrival date & time: 07/02/21  2020 ? ?  ? ?History ? ?Chief Complaint  ?Patient presents with  ? Leg Pain  ?  Left leg behind Knee  ? ? ?April [name] is a 31 y.o. female.  Presenting to ER with concern for left knee pain.  Patient has been having pain in her left knee for about a month.  Worse kind of behind her knee, worse with movement, able to walk but does have increased pain with walking.  No fevers, chills, no significant swelling noted.  No known injuries. ? ?She denies any chronic medical problems. ? ?HPI ? ?  ? ?Home Medications ?Prior to Admission medications   ?Medication Sig Start Date End Date Taking? Authorizing Provider  ?naproxen (NAPROSYN) 500 MG tablet Take 1 tablet (500 mg total) by mouth 2 (two) times daily as needed. 07/02/21  Yes Milagros Loll, MD  ?prenatal vitamin w/FE, FA (NATACHEW) 29-1 MG CHEW chewable tablet Chew 1 tablet by mouth daily at 12 noon.    [provider]  ?   ? ?Allergies    ?Eggs or egg-derived products, Peanut-containing drug products, and Fish allergy   ? ?Review of Systems   ?Review of Systems  ?Musculoskeletal:  Positive for arthralgias.  ?All other systems reviewed and are negative. ? ?Physical Exam ?Updated Vital Signs ?BP (!) 109/58 (BP Location: Left Arm)   Pulse 82   Temp 97.8 ?F (36.6 ?C) (Oral)   Resp 16   LMP 04/11/2021   SpO2 100%  ?Physical Exam ?Vitals and nursing note reviewed.  ?Constitutional:   ?   General: She is not in acute distress. ?   Appearance: She is well-developed.  ?HENT:  ?   Head: Normocephalic and atraumatic.  ?Eyes:  ?   Conjunctiva/sclera: Conjunctivae normal.  ?Cardiovascular:  ?   Rate and Rhythm: Normal rate.  ?   Pulses: Normal pulses.  ?   Heart sounds: No murmur heard. ?Pulmonary:  ?   Effort: Pulmonary effort is normal. No respiratory distress.  ?Abdominal:  ?   Palpations: Abdomen is soft.  ?   Tenderness: There is no  abdominal tenderness.  ?Musculoskeletal:  ?   Cervical back: Neck supple.  ?   Comments: Some generalized tenderness around the knee, posterior knee but there is no significant swelling or deformity, normal DP/PT pulses, normal sensation and motor distally  ?Skin: ?   General: Skin is warm and dry.  ?   Capillary Refill: Capillary refill takes less than 2 seconds.  ?Neurological:  ?   General: No focal deficit present.  ?   Mental Status: She is alert.  ?Psychiatric:     ?   Mood and Affect: Mood normal.  ? ? ?ED Results / Procedures / Treatments   ?Labs ?(all labs ordered are listed, but only abnormal results are displayed) ?Labs Reviewed - No data to display ? ?EKG ?None ? ?Radiology ?DG Knee Complete 4 Views Left ? ?Result Date: 07/02/2021 ?CLINICAL DATA:  Posterior left knee pain for a couple of weeks, much worse today. EXAM: LEFT KNEE - COMPLETE 4+ VIEW COMPARISON:  None. FINDINGS: No evidence of fracture, dislocation, or joint effusion. No evidence of arthropathy or other focal bone abnormality. Soft tissue swelling is seen along the left popliteal fossa. IMPRESSION: Soft tissue swelling along the left popliteal fossa, without an acute osseous abnormality. Sequelae associated with a ruptured Baker's cyst cannot be  excluded. Correlation with ultrasonographic evaluation of this region is recommended. Electronically Signed   By: Aram Candela M.D.   On: 07/02/2021 22:06  ? ?VAS [place] LOWER EXTREMITY VENOUS (DVT) (7a-7p) ? ?Result Date: 07/03/2021 ? Lower Venous DVT Study Patient Name:  April [name]  Date of Exam:   07/03/2021 Medical Rec #: [id number]          Accession #:    [id number] Date of Birth: 05/30/1990         Patient Gender: F Patient Age:   28 years Exam Location:  [facility] Procedure:      VAS [place] LOWER EXTREMITY VENOUS (DVT) Referring Phys: KRISTIE HORTON --------------------------------------------------------------------------------  Indications: Posterior knee pain.  Comparison Study:  No previous exams Performing Technologist: Jody Hill RVT, RDMS  Examination Guidelines: A complete evaluation includes B-mode imaging, spectral Doppler, color Doppler, and power Doppler as needed of all accessible portions of each vessel. Bilateral testing is considered an integral part of a complete examination. Limited examinations for reoccurring indications may be performed as noted. The reflux portion of the exam is performed with the patient in reverse Trendelenburg.  +-----+---------------+---------+-----------+----------+--------------+ RIGHTCompressibilityPhasicitySpontaneityPropertiesThrombus Aging +-----+---------------+---------+-----------+----------+--------------+ CFV  Full           Yes      Yes                                 +-----+---------------+---------+-----------+----------+--------------+   +--------+---------------+---------+-----------+----------+--------------------+ LEFT    CompressibilityPhasicitySpontaneityPropertiesThrombus Aging       +--------+---------------+---------+-----------+----------+--------------------+ CFV     Full           Yes      Yes                                       +--------+---------------+---------+-----------+----------+--------------------+ SFJ     Full                                                              +--------+---------------+---------+-----------+----------+--------------------+ FV Prox Full           Yes      Yes                                       +--------+---------------+---------+-----------+----------+--------------------+ FV Mid  Full           Yes      Yes                                       +--------+---------------+---------+-----------+----------+--------------------+ FV      Full           Yes      Yes                                       Distal                                                                     +--------+---------------+---------+-----------+----------+--------------------+  PFV                                                  patent by                                                                 color/doppler        +--------+---------------+---------+-----------+----------+--------------------+ POP     Full           Yes      Yes                                       +--------+---------------+---------+-----------+----------+--------------------+ PTV     Full                                                              +--------+---------------+---------+-----------+----------+--------------------+ PERO    Full                                                              +--------+---------------+---------+-----------+----------+--------------------+     Summary: RIGHT: - No evidence of common femoral vein obstruction.  LEFT: - There is no evidence of deep vein thrombosis in the lower extremity. - There is no evidence of superficial venous thrombosis.  - No cystic structure found in the popliteal fossa.  *See table(s) above for measurements and observations.    Preliminary    ? ?Procedures ?Procedures  ? ? ?Medications Ordered in ED ?Medications - No data to display ? ? ?ED Course/ Medical Decision Making/ A&P ?  ?                        ?Medical Decision Making ?Amount and/or Complexity of Data Reviewed ?Radiology: ordered. ? ?Risk ?Prescription drug management. ? ? ?31 year old lady presenting to the emergency room with concern for left knee pain.  She has some generalized pain around the knee but has normal range of motion, no generalized leg swelling noted.  X-ray negative.  Radiologist commented on soft tissue swelling along the popliteal fossa, ruptured Baker's cyst cannot be excluded, recommended ultrasound for further testing.  I have low clinical suspicion for DVT.  Given radiology recommendations, ordered outpatient DVT study.  I instructed patient to return  tomorrow morning for DVT study.  Advised follow-up with primary care or Ortho. ? ? ? ? ? ? ? ?Final Clinical Impression(s) / ED Diagnoses ?Final diagnoses:  ?Left leg pain  ? ? ?Rx / DC Orders ?ED Discharge Orders   ? ?      Ordered  ?  VAS [place] LOWER EXTREMITY VENOUS (DVT)       ?  07/02/21 2302  ?  naproxen (NAPROSYN) 500 MG tablet  2 times daily PRN       ? 03

## 2021-07-02 NOTE — Discharge Instructions (Addendum)
Please come back tomorrow for ultrasound of your left leg to rule out cyst, blood clot.  Recommend taking Tylenol, anti-inflammatory such as naproxen for pain control. ? ?Follow-up with your primary doctor or with an orthopedic specialist. ?

## 2021-07-02 NOTE — ED Triage Notes (Signed)
Pt arrives to ED POV ambulatory to triage c/o Left Leg pain. Pt states the pain is behind her left knee and has been going on for a couple of weeks but today she is unable to bend her knee. States pain is only 10/10 when she bends her knee. Denies any falls or injuries. No other complaints at this time. ?

## 2021-07-03 ENCOUNTER — Emergency Department (HOSPITAL_BASED_OUTPATIENT_CLINIC_OR_DEPARTMENT_OTHER): Payer: Medicaid Other

## 2021-07-03 ENCOUNTER — Encounter (HOSPITAL_COMMUNITY): Payer: Self-pay | Admitting: Emergency Medicine

## 2021-07-03 ENCOUNTER — Other Ambulatory Visit: Payer: Self-pay

## 2021-07-03 ENCOUNTER — Encounter (HOSPITAL_COMMUNITY): Payer: Medicaid Other

## 2021-07-03 ENCOUNTER — Emergency Department (HOSPITAL_COMMUNITY)
Admission: EM | Admit: 2021-07-03 | Discharge: 2021-07-03 | Disposition: A | Discharge status: Home / Self Care | Payer: Medicaid Other | Attending: Emergency Medicine | Admitting: Emergency Medicine

## 2021-07-03 DIAGNOSIS — S8392XA Sprain of unspecified site of left knee, initial encounter: Secondary | ICD-10-CM | POA: Insufficient documentation

## 2021-07-03 DIAGNOSIS — Z3A12 12 weeks gestation of pregnancy: Secondary | ICD-10-CM | POA: Insufficient documentation

## 2021-07-03 DIAGNOSIS — X509XXA Other and unspecified overexertion or strenuous movements or postures, initial encounter: Secondary | ICD-10-CM | POA: Insufficient documentation

## 2021-07-03 DIAGNOSIS — Z91010 Allergy to peanuts: Secondary | ICD-10-CM | POA: Insufficient documentation

## 2021-07-03 DIAGNOSIS — M25562 Pain in left knee: Secondary | ICD-10-CM

## 2021-07-03 DIAGNOSIS — O9A211 Injury, poisoning and certain other consequences of external causes complicating pregnancy, first trimester: Secondary | ICD-10-CM | POA: Insufficient documentation

## 2021-07-03 NOTE — Progress Notes (Signed)
Orthopedic Tech Progress Note ?Patient Details:  ?April [name] ?[birth year] ?[id number] ? ?Ortho Devices ?Type of Ortho Device: Knee Immobilizer ?Ortho Device/Splint Location: lle ?Ortho Device/Splint Interventions: Ordered, Application, Adjustment ?  ?Post Interventions ?Patient Tolerated: Well ? ?Al Decant ?07/03/2021, 3:33 PM ? ?

## 2021-07-03 NOTE — ED Notes (Signed)
RN reviewed discharge instructions w/ pt. Follow up and pain management reviewed, pt had no further questions. 

## 2021-07-03 NOTE — Discharge Instructions (Addendum)
You were seen in the emergency department today for left knee pain. ? ?As we discussed the ultrasound performed of your leg showed no blood clots. ? ?You can take Tylenol as needed for pain.  You also can try ice or warm compresses, whichever feels better.  We have placed your knee in a splint for comfort. ? ?I have attached the contact information for the orthopedic doctor, for you to call and schedule an appointment for further evaluation. ?

## 2021-07-03 NOTE — ED Triage Notes (Signed)
Pt states she was seen in ED last night for posterior L knee pain when she bends her knee x 1 month and had an x-ray.  States she was told to return to ED today for an ultrasound of leg.  Questioned pt if she was supposed to return this morning for outpatient vascular US and she states she was told to just come back to ED anytime during the day.   ?

## 2021-07-03 NOTE — ED Provider Notes (Signed)
?[facility] ?Provider Note ? ? ?CSN: [id number] ?Arrival date & time: 07/03/21  1241 ? ?  ? ?History ? ?Chief Complaint  ?Patient presents with  ? Knee Pain  ? ? ?April [name] is a 31 y.o. female who presents to the emergency department for follow-up of left knee pain.  Patient states that she has been having posterior left knee pain when she bends her knee for the past 1 month.  She was seen in the ER last night and had a normal x-ray, she was told to return today for an ultrasound of the leg to rule out DVT. ? ? ?Knee Pain ? ?  ? ?Home Medications ?Prior to Admission medications   ?Medication Sig Start Date End Date Taking? Authorizing Provider  ?naproxen (NAPROSYN) 500 MG tablet Take 1 tablet (500 mg total) by mouth 2 (two) times daily as needed. 07/02/21   Milagros Lollykstra, Richard S, MD  ?prenatal vitamin w/FE, FA (NATACHEW) 29-1 MG CHEW chewable tablet Chew 1 tablet by mouth daily at 12 noon.    [provider]  ?   ? ?Allergies    ?Eggs or egg-derived products, Peanut-containing drug products, and Fish allergy   ? ?Review of Systems   ?Review of Systems  ?Musculoskeletal:   ?     Left leg pain  ?All other systems reviewed and are negative. ? ?Physical Exam ?Updated Vital Signs ?BP (!) 124/59 (BP Location: Right Arm)   Pulse 79   Temp 98.6 ?F (37 ?C) (Oral)   Resp 16   LMP 04/11/2021   SpO2 100%  ?Physical Exam ?Vitals and nursing note reviewed.  ?Constitutional:   ?   Appearance: Normal appearance.  ?HENT:  ?   Head: Normocephalic and atraumatic.  ?Eyes:  ?   Conjunctiva/sclera: Conjunctivae normal.  ?Pulmonary:  ?   Effort: Pulmonary effort is normal. No respiratory distress.  ?Musculoskeletal:  ?   Comments: No deformity noted of the L knee. No effusion, overlying skin changes, or increase in warmth. No focal tenderness. Good distal pulses.   ?Skin: ?   General: Skin is warm and dry.  ?Neurological:  ?   Mental Status: She is alert.  ?Psychiatric:     ?   Mood  and Affect: Mood normal.     ?   Behavior: Behavior normal.  ? ? ?ED Results / Procedures / Treatments   ?Labs ?(all labs ordered are listed, but only abnormal results are displayed) ?Labs Reviewed - No data to display ? ?EKG ?None ? ?Radiology ?DG Knee Complete 4 Views Left ? ?Result Date: 07/02/2021 ?CLINICAL DATA:  Posterior left knee pain for a couple of weeks, much worse today. EXAM: LEFT KNEE - COMPLETE 4+ VIEW COMPARISON:  None. FINDINGS: No evidence of fracture, dislocation, or joint effusion. No evidence of arthropathy or other focal bone abnormality. Soft tissue swelling is seen along the left popliteal fossa. IMPRESSION: Soft tissue swelling along the left popliteal fossa, without an acute osseous abnormality. Sequelae associated with a ruptured Baker's cyst cannot be excluded. Correlation with ultrasonographic evaluation of this region is recommended. Electronically Signed   By: Aram Candelahaddeus  Houston M.D.   On: 07/02/2021 22:06  ? ?VAS US LOWER EXTREMITY VENOUS (DVT) (7a-7p) ? ?Result Date: 07/03/2021 ? Lower Venous DVT Study Patient Name:  April [name]  Date of Exam:   07/03/2021 Medical Rec #: [id number]          Accession #:    [id number] Date of  Birth: 1990/05/04         Patient Gender: F Patient Age:   87 years Exam Location:  [facility] Procedure:      VAS [place] LOWER EXTREMITY VENOUS (DVT) Referring Phys: KRISTIE HORTON --------------------------------------------------------------------------------  Indications: Posterior knee pain.  Comparison Study: No previous exams Performing Technologist: Jody Hill RVT, RDMS  Examination Guidelines: A complete evaluation includes B-mode imaging, spectral Doppler, color Doppler, and power Doppler as needed of all accessible portions of each vessel. Bilateral testing is considered an integral part of a complete examination. Limited examinations for reoccurring indications may be performed as noted. The reflux portion of the exam is performed with the  patient in reverse Trendelenburg.  +-----+---------------+---------+-----------+----------+--------------+ RIGHTCompressibilityPhasicitySpontaneityPropertiesThrombus Aging +-----+---------------+---------+-----------+----------+--------------+ CFV  Full           Yes      Yes                                 +-----+---------------+---------+-----------+----------+--------------+   +--------+---------------+---------+-----------+----------+--------------------+ LEFT    CompressibilityPhasicitySpontaneityPropertiesThrombus Aging       +--------+---------------+---------+-----------+----------+--------------------+ CFV     Full           Yes      Yes                                       +--------+---------------+---------+-----------+----------+--------------------+ SFJ     Full                                                              +--------+---------------+---------+-----------+----------+--------------------+ FV Prox Full           Yes      Yes                                       +--------+---------------+---------+-----------+----------+--------------------+ FV Mid  Full           Yes      Yes                                       +--------+---------------+---------+-----------+----------+--------------------+ FV      Full           Yes      Yes                                       Distal                                                                    +--------+---------------+---------+-----------+----------+--------------------+ PFV  patent by                                                                 color/doppler        +--------+---------------+---------+-----------+----------+--------------------+ POP     Full           Yes      Yes                                       +--------+---------------+---------+-----------+----------+--------------------+ PTV      Full                                                              +--------+---------------+---------+-----------+----------+--------------------+ PERO    Full                                                              +--------+---------------+---------+-----------+----------+--------------------+     Summary: RIGHT: - No evidence of common femoral vein obstruction.  LEFT: - There is no evidence of deep vein thrombosis in the lower extremity. - There is no evidence of superficial venous thrombosis.  - No cystic structure found in the popliteal fossa.  *See table(s) above for measurements and observations.    Preliminary    ? ?Procedures ?Procedures  ? ? ?Medications Ordered in ED ?Medications - No data to display ? ?ED Course/ Medical Decision Making/ A&P ?  ?                        ?Medical Decision Making ? ?This patient is a 31 year old female who presents to the ED for concern of left knee pain.  ? ?Differential diagnoses prior to evaluation: ?The emergent differential diagnosis includes, but is not limited to, acute fracture, dislocation, DVT, cellulitis, cyst, ligament or meniscal injury, knee sprain.  ? ?This is not an exhaustive differential.  ? ?Past Medical History / Co-morbidities: ?Currently [redacted] weeks pregnant ? ?Additional history: ?I reviewed the note from patient's ER visit yesterday as well as the x-ray imaging findings, that showed no acute fractures or dislocations of the left knee. ? ?Physical Exam: ?Physical exam performed. The pertinent findings include: No deformity noted of the left knee.  There is no focal tenderness.  No effusions, overlying skin changes, or increase in warmth.  She is neurovascularly and neuromuscularly intact in bilateral lower extremities. ? ?Lab Tests/Imaging studies: ?I Ordered, and personally interpreted labs/imaging including left lower leg DVT study.  The pertinent results include: no DVT or cyst. I agree with the radiologist interpretation. ?   ?Disposition: ?After consideration of the diagnostic results and the patients response to treatment, I feel that patient is not requiring admission or inpatient treatment for her symptoms.  She has had an x-ray and an ultrasound of the  knee, and there is been no evidence of acute injury or blood clot.  I discussed with

## 2021-07-03 NOTE — ED Notes (Signed)
Pt in vascular.

## 2021-07-06 ENCOUNTER — Telehealth: Payer: Self-pay | Admitting: Family Medicine

## 2021-07-06 NOTE — Telephone Encounter (Signed)
Called patient to discuss results of Panorama. She stated that she did also want to know the predicted fetal sex. Discussed that patient is low risk for aneuploidies and microdeletion's that were tested. Predicted fetal sex is female. She was excited about the news. ? ?Patient mentioned anxiety regarding not being able to find fetal heart with her home doppler. Discussed she is still early on and fetus is sitting low and this can make it difficult to find. Reassured that fetal heart tones would be checked at upcoming appointments and if there is any concern we would send her for more thorough evaluation. She is asymptomatic at this time. All visits thus far have been reassuring.  ?

## 2021-07-08 ENCOUNTER — Encounter: Payer: Self-pay | Admitting: Family Medicine

## 2021-07-11 ENCOUNTER — Other Ambulatory Visit: Payer: Self-pay

## 2021-07-11 ENCOUNTER — Inpatient Hospital Stay (HOSPITAL_COMMUNITY)
Admission: AD | Admit: 2021-07-11 | Discharge: 2021-07-11 | Disposition: A | Discharge status: Home / Self Care | Payer: Medicaid Other | Attending: Family Medicine | Admitting: Family Medicine

## 2021-07-11 DIAGNOSIS — O26891 Other specified pregnancy related conditions, first trimester: Secondary | ICD-10-CM | POA: Diagnosis not present

## 2021-07-11 DIAGNOSIS — Z3201 Encounter for pregnancy test, result positive: Secondary | ICD-10-CM

## 2021-07-11 DIAGNOSIS — Z3A13 13 weeks gestation of pregnancy: Secondary | ICD-10-CM | POA: Insufficient documentation

## 2021-07-11 DIAGNOSIS — R109 Unspecified abdominal pain: Secondary | ICD-10-CM | POA: Diagnosis not present

## 2021-07-11 NOTE — MAU Provider Note (Addendum)
Event Date/Time  ? First Provider Initiated Contact with Patient 07/11/21 1828   ?  ? ?S ?Ms. April [name] is a 31 y.o. G2P1001 patient who presents to MAU today with complaint of intermittent abdominal cramping that woke her up three nights in a row. No pain now. Feels like braxton hicks contraction. No palliating or provoking factors.  ? ?O ?BP 122/66 (BP Location: Right Arm)   Pulse 73   Temp 99 ?F (37.2 ?C) (Oral)   Resp 16   Ht 5\' 1"  (1.549 m)   Wt 84.1 kg   LMP 04/11/2021   SpO2 100%   BMI 35.03 kg/m?  ?Physical Exam ?Vitals reviewed. Exam conducted with a chaperone present.  ?Constitutional:   ?   Appearance: Normal appearance.  ?Abdominal:  ?   General: Abdomen is flat. There is no distension.  ?   Palpations: Abdomen is soft.  ?   Tenderness: There is abdominal tenderness (mild uterine tenderness). There is no guarding or rebound.  ?Neurological:  ?   Mental Status: She is alert.  ?Psychiatric:     ?   Mood and Affect: Mood normal.     ?   Behavior: Behavior normal.     ?   Thought Content: Thought content normal.     ?   Judgment: Judgment normal.  ? ? ?A ?Medical screening exam complete ?1. [redacted] weeks gestation of pregnancy   ?2. Abdominal pain, unspecified abdominal location   ?3. Positive pregnancy test   ? ? ? ?P ?Discharge from MAU in stable condition ?No concern for ectopic pregnancy. No adnexal tenderness. Will schedule Outpatient [place] as no current pain. ? ?Truett Mainland, DO ?07/11/2021 6:35 PM  ? ?

## 2021-07-11 NOTE — MAU Note (Signed)
April [name] is a 31 y.o. at [redacted]w[redacted]d here in MAU reporting: a couple days ago woke up ver early in the morning, woke up with pain in her stomach.  Happened 3 days in a row.  Tried to listen with her doppler and she didn't hear anything.  Dealing with some personal stress, this got her concerned. Not currently having pain now. No bleeding. No diarrhea, constipation or vomiting. Denies urinary problems ? ?Onset of complaint: 3 days ago. ?Pain score: 0 ?Vitals:  ? 07/11/21 1818  ?BP: 122/66  ?Pulse: 73  ?Resp: 16  ?Temp: 99 ?F (37.2 ?C)  ?SpO2: 100%  ?   ?FHT:154 ?Lab orders placed from triage:  none ? ?After pt heard the heartbeat with the doppler, she asked if she was going to be able to see it? ?

## 2021-07-13 ENCOUNTER — Encounter: Payer: Self-pay | Admitting: Family Medicine

## 2021-07-18 ENCOUNTER — Ambulatory Visit
Admission: RE | Admit: 2021-07-18 | Discharge: 2021-07-18 | Disposition: A | Discharge status: Home / Self Care | Payer: Medicaid Other | Source: Ambulatory Visit | Attending: Family Medicine | Admitting: Family Medicine

## 2021-07-18 ENCOUNTER — Other Ambulatory Visit: Payer: Self-pay | Admitting: Family Medicine

## 2021-07-18 ENCOUNTER — Other Ambulatory Visit (HOSPITAL_COMMUNITY): Payer: Self-pay | Admitting: Family Medicine

## 2021-07-18 ENCOUNTER — Ambulatory Visit (HOSPITAL_BASED_OUTPATIENT_CLINIC_OR_DEPARTMENT_OTHER): Payer: Medicaid Other

## 2021-07-18 DIAGNOSIS — O26892 Other specified pregnancy related conditions, second trimester: Secondary | ICD-10-CM | POA: Diagnosis not present

## 2021-07-18 DIAGNOSIS — O99212 Obesity complicating pregnancy, second trimester: Secondary | ICD-10-CM | POA: Diagnosis not present

## 2021-07-18 DIAGNOSIS — R109 Unspecified abdominal pain: Secondary | ICD-10-CM | POA: Insufficient documentation

## 2021-07-18 DIAGNOSIS — Z3A13 13 weeks gestation of pregnancy: Secondary | ICD-10-CM

## 2021-07-18 DIAGNOSIS — E669 Obesity, unspecified: Secondary | ICD-10-CM

## 2021-07-18 DIAGNOSIS — Z3A14 14 weeks gestation of pregnancy: Secondary | ICD-10-CM

## 2021-07-25 NOTE — Progress Notes (Signed)
?  Patient Name: ASENET CASIANO ?Date of Birth: [birth year] ?[place] Prenatal Visit ? ?Sarae Sullivan Lone is a 31 y.o. G2P1001 at [redacted]w[redacted]d here for routine follow up. She is dated by LMP.  She states she went to the MAU for abdominal pain which she states was new for her and she was concerned because she did not feel that with her first child. Denies any abdominal pain today. Denies bleeding, leakage of fluid. Endorses a little nausea but no emesis. Has been snacking frequently.   She denies vaginal bleeding.  See flow sheet for details. ? ? ?Vitals:  ? 07/26/21 1336  ?BP: 105/60  ?Pulse: 89  ? ? ?A/P: Pregnancy at [redacted]w[redacted]d.  Doing well.   ? ?Routine Prenatal Care:  ?Dating reviewed, dating tab is correct ?Fetal heart tones not checked today. Will ensure they are checked at follow up ?Influenza vaccine not administered as not influenza season.   ?COVID vaccination was discussed and patient declines ?The patient has been screened for preexisting diabetes and had a normal1 hr gtt on 2/21 of 132. Will need repeat at 24-28 weeks ?Anatomy ultrasound ordered to be scheduled at 18-20 weeks. ?Patient has had genetic screening. Low risk Panorama  ?Pregnancy education including expected weight gain in pregnancy, OTC medication use, continued use of prenatal vitamin, smoking cessation if applicable, and nutrition in pregnancy.   ?Bleeding and pain precautions reviewed. ?The patient has the following indications for aspirinto begin 81 mg at 12-16 weeks: ?One high risk condition: no single high risk condition  ?MORE than one moderate risk condition: obesity and identifies as African American  ?Aspirin was  recommended but patient declines  ? ?2. Pregnancy issues include the following and were addressed as appropriate today: ? ?Problem list  and pregnancy box updated: Yes.  ? ? ?Follow up 4 weeks. ?  ?

## 2021-07-26 ENCOUNTER — Ambulatory Visit (INDEPENDENT_AMBULATORY_CARE_PROVIDER_SITE_OTHER): Payer: Medicaid Other | Admitting: Family Medicine

## 2021-07-26 VITALS — BP 105/60 | HR 89 | Wt 186.4 lb

## 2021-07-26 DIAGNOSIS — Z3A15 15 weeks gestation of pregnancy: Secondary | ICD-10-CM

## 2021-07-26 DIAGNOSIS — Z3482 Encounter for supervision of other normal pregnancy, second trimester: Secondary | ICD-10-CM

## 2021-07-26 NOTE — Patient Instructions (Signed)
It was great seeing you today! ? ?You came in for your OB follow-up visit, and I am glad to see you are doing well!  We are working to get your anatomy ultrasound scheduled, and if you are not able to do it today before you leave we will schedule that in give you a call with that date. ? ?Go to the MAU at [facility] if: ?You have pain in your lower abdomen or pelvic area ?Your water breaks.  Sometimes it is a big gush of fluid, sometimes it is just a trickle that keeps getting your underwear wet or running down your legs ?You have vaginal bleeding.  ? ?Please check-out at the front desk before leaving the clinic. I'd like to see you back in 4 weeks for your next OB follow up,  but if you need to be seen earlier than that for any new issues we're happy to fit you in, just give [place] a call! ? ?Feel free to call with any questions or concerns at any time, at [phone]. ?  ?Take care,  ?Dr. Cora Collum ?[facility]  ?

## 2021-07-27 ENCOUNTER — Encounter: Payer: Self-pay | Admitting: Family Medicine

## 2021-08-07 ENCOUNTER — Encounter (HOSPITAL_COMMUNITY): Payer: Self-pay | Admitting: Obstetrics & Gynecology

## 2021-08-07 ENCOUNTER — Other Ambulatory Visit: Payer: Self-pay

## 2021-08-07 ENCOUNTER — Inpatient Hospital Stay (HOSPITAL_COMMUNITY)
Admission: AD | Admit: 2021-08-07 | Discharge: 2021-08-07 | Disposition: A | Discharge status: Home / Self Care | Payer: Medicaid Other | Attending: Obstetrics & Gynecology | Admitting: Obstetrics & Gynecology

## 2021-08-07 DIAGNOSIS — O99342 Other mental disorders complicating pregnancy, second trimester: Secondary | ICD-10-CM | POA: Insufficient documentation

## 2021-08-07 DIAGNOSIS — R160 Hepatomegaly, not elsewhere classified: Secondary | ICD-10-CM

## 2021-08-07 DIAGNOSIS — Z3A16 16 weeks gestation of pregnancy: Secondary | ICD-10-CM | POA: Diagnosis not present

## 2021-08-07 DIAGNOSIS — I10 Essential (primary) hypertension: Secondary | ICD-10-CM | POA: Diagnosis not present

## 2021-08-07 DIAGNOSIS — O132 Gestational [pregnancy-induced] hypertension without significant proteinuria, second trimester: Secondary | ICD-10-CM | POA: Insufficient documentation

## 2021-08-07 DIAGNOSIS — R000 Tachycardia, unspecified: Secondary | ICD-10-CM | POA: Diagnosis not present

## 2021-08-07 DIAGNOSIS — R457 State of emotional shock and stress, unspecified: Secondary | ICD-10-CM | POA: Diagnosis not present

## 2021-08-07 DIAGNOSIS — F410 Panic disorder [episodic paroxysmal anxiety] without agoraphobia: Secondary | ICD-10-CM | POA: Diagnosis not present

## 2021-08-07 DIAGNOSIS — R030 Elevated blood-pressure reading, without diagnosis of hypertension: Secondary | ICD-10-CM

## 2021-08-07 LAB — COMPREHENSIVE METABOLIC PANEL
ALT: 53 U/L — ABNORMAL HIGH (ref 0–44)
AST: 26 U/L (ref 15–41)
Albumin: 2.9 g/dL — ABNORMAL LOW (ref 3.5–5.0)
Alkaline Phosphatase: 66 U/L (ref 38–126)
Anion gap: 7 (ref 5–15)
BUN: 7 mg/dL (ref 6–20)
CO2: 21 mmol/L — ABNORMAL LOW (ref 22–32)
Calcium: 8.9 mg/dL (ref 8.9–10.3)
Chloride: 108 mmol/L (ref 98–111)
Creatinine, Ser: 0.52 mg/dL (ref 0.44–1.00)
GFR, Estimated: >60 mL/min (ref >60)
Glucose, Bld: 85 mg/dL (ref 70–99)
Potassium: 3.8 mmol/L (ref 3.5–5.1)
Sodium: 136 mmol/L (ref 135–145)
Total Bilirubin: 0.3 mg/dL (ref 0.3–1.2)
Total Protein: 6.3 g/dL — ABNORMAL LOW (ref 6.5–8.1)

## 2021-08-07 LAB — CBC
HCT: 33.1 % — ABNORMAL LOW (ref 36.0–46.0)
Hemoglobin: 11.4 g/dL — ABNORMAL LOW (ref 12.0–15.0)
MCH: 30 pg (ref 26.0–34.0)
MCHC: 34.4 g/dL (ref 30.0–36.0)
MCV: 87.1 fL (ref 80.0–100.0)
Platelets: 313 10*3/uL (ref 150–400)
RBC: 3.8 MIL/uL — ABNORMAL LOW (ref 3.87–5.11)
RDW: 13.9 % (ref 11.5–15.5)
WBC: 10.2 10*3/uL (ref 4.0–10.5)
nRBC: 0 % (ref 0.0–0.2)

## 2021-08-07 NOTE — MAU Note (Signed)
Pt reports to mau via ems for c/o elevated bp while at work this morning.  Pt reports she started feeling "not right"  states what she felt is "hard to explain".  Reports RN took her bp at work and had her lay down and drink water.  Initial pressure at work was 130s/70s. Pt states she began crying and felt like she couldn't breathe so EMS was called.  Pt denies any SOB or other symptoms currently.  Denies abd pain, bleeding, or LOF.  FHR 145 ?

## 2021-08-07 NOTE — Discharge Instructions (Signed)
[facility] Providers  ? ?[facility] [employer] [facility]             ?[address], [place], [place] [address] ?[phone] ? ?[facility] for [employer] at [facility]                                                             ?[address], [address], [place], [place], [address] ?[phone] ? ?[facility] [employer] [facility] [place]                                    ?[address], [address], [place], [place], [address] ?[phone] ? ?[facility] for [employer] at [facility] ?[address], [address], [place], [place], [address] ?[phone] ? ?[facility] for [employer] at [facility]                                 ?[address] [place], [place], [place], [address] ?[phone] ? ?Center for [facility] at [facility]                                    ?[address] [place], [place], [address] ?[phone] ? ?[facility] for [employer] at [facility] ?[address], [place], [place], [address]                             ? ?[facility] ?[address], [address], [place], [place], [address] ?[phone] ? ?[facility]         ?Phone: [phone] ? ?[facility]      ?Phone: [phone]  ? ?[facility]      ?Phone: [phone] ? ?[facility] Department-Family Planning         ?Phone: [phone]  ? ?[facility] Department-Maternity    ?Phone: [phone] ? ?[facility]      ?Phone: [phone] ? ?[facility]     ?Phone: [phone] ? ?[facility]        ?Phone: [phone] ? ?[facility]      ?Phone: [phone] ? ? ?Thank you for your interest in CenteringPregnancy.I wanted to give you a little more information. ?We are starting a new way to provide prenatal care - a way where there is less waiting and lots more fun and education. You?ll learn about  things like common discomforts of pregnancy, infant care, breastfeeding, nutrition and what to expect in labor. It's called CenteringPregnancy. You will meet in a group with other pregnant women due around the same time as you.? In Centering you will have individual time with the provider who will be in the room the whole time - so you will actually have much more time with your provider in Centering than in traditional prenatal care.? You will come directly into the Centering room and will not wait in the lobby so there is no wasted time.? You will have  2-hour visits every 4 weeks then every 2 weeks. You will know your Centering prenatal appointments in advance. In your last month of pregnancy, you may also come in for some individual visits. Additional appointments can be scheduled if you need more care. Studies have shown that CenteringPregnancy improves birth outcomes. We have seen especially big improvements in fewer Black women delivering babies who are too small or born too early.  ?There is a website that you can browse. It is www.centeringhealthcare.org , look for centeringpregnancy ?Call [facility] at [phone] if interested. ? ? ?  ?

## 2021-08-07 NOTE — MAU Provider Note (Signed)
Chief Complaint: Anxiety and Hypertension ? ? Event Date/Time  ? First Provider Initiated Contact with Patient 08/07/21 1019   ?  ?SUBJECTIVE ?HPI: April [name] is a 31 y.o. G2P1001 at 962w6d who presents to Maternity Admissions reporting episode of racing GR, Hypertension and SOB at work today that resolved spontaneously by the time she arrived to MAU. Pt states BP was mildly elevated and this made her emotional and when her BP was rechecked it was higher--150's systolic. Coworkers called EMS because pt said she felt like she couldn't breath. Doesn't know what precipitated the episode but her mother says she has been under a lot of stress. Pt did not want to disclose her stresses.  ? ?Modifying factors: Pt thinks Sx were precipitated stress. They resolved spontaneously.  ?Associated signs and symptoms: Neg for fever, chills, chest pain, cough, wheezing, calf pain. Pos for mild congestion.  ? ?No Hx cardiac or lung problems.  ? ?Past Medical History:  ?Diagnosis Date  ? Medical history non-contributory   ? ?OB History  ?Gravida Para Term Preterm AB Living  ?2 1 1  0 0 1  ?SAB IAB Ectopic Multiple Live Births  ?0 0 0 0 1  ?  ?# Outcome Date GA Lbr Len/2nd Weight Sex Delivery Anes PTL Lv  ?2 Current           ?1 Term 02/26/14 669w1d 05:06 / 00:27 3067 g M Vag-Spont EPI  LIV  ?   Birth Comments: caput  ? ?Past Surgical History:  ?Procedure Laterality Date  ? NO PAST SURGERIES    ? WISDOM TOOTH EXTRACTION  2013  ? ?Social History  ? ?Socioeconomic History  ? Marital status: Married  ?  Spouse name: Not on file  ? Number of children: Not on file  ? Years of education: Not on file  ? Highest education level: Not on file  ?Occupational History  ? Not on file  ?Tobacco Use  ? Smoking status: Never  ? Smokeless tobacco: Never  ?Vaping Use  ? Vaping Use: Never used  ?Substance and Sexual Activity  ? Alcohol use: No  ? Drug use: No  ? Sexual activity: Yes  ?  Birth control/protection: None  ?Other Topics Concern  ? Not on  file  ?Social History Narrative  ? Lives with mother April [name], 3 younger siblings Richada, Darrien and Nyjha.   ? ?Social Determinants of Health  ? ?Financial Resource Strain: Not on file  ?Food Insecurity: Not on file  ?Transportation Needs: Not on file  ?Physical Activity: Not on file  ?Stress: Not on file  ?Social Connections: Not on file  ?Intimate Partner Violence: Not on file  ? ?Family History  ?Problem Relation Age of Onset  ? Diabetes Maternal Aunt   ? Diabetes Maternal Uncle   ? Hypertension Maternal Uncle   ? Diabetes Maternal Grandmother   ? Hypertension Maternal Grandmother   ? Cancer Maternal Grandfather   ? Hearing loss Neg Hx   ? ?No current facility-administered medications on file prior to encounter.  ? ?Current Outpatient Medications on File Prior to Encounter  ?Medication Sig Dispense Refill  ? prenatal vitamin w/FE, FA (NATACHEW) 29-1 MG CHEW chewable tablet Chew 1 tablet by mouth daily at 12 noon.    ? ?Allergies  ?Allergen Reactions  ? Eggs Or Egg-Derived Products Hives and Swelling  ?   Mother and patient state swelling of throat, itching of throat, hives.  ? Peanut-Containing Drug Products Anaphylaxis  ?  All nut  products per patient  ? Fish Allergy Hives  ? ? ?I have reviewed patient's Past Medical Hx, Surgical Hx, Family Hx, Social Hx, medications and allergies.  ? ?Review of Systems  ?Constitutional:  Negative for diaphoresis.  ?HENT:  Negative for congestion.   ?Respiratory:  Positive for shortness of breath (resolved). Negative for cough, chest tightness and wheezing.   ?Cardiovascular:  Positive for palpitations (Rare, none now).  ?Gastrointestinal:  Negative for abdominal pain.  ?Psychiatric/Behavioral:  The patient is nervous/anxious.   ? ?OBJECTIVE ?Patient Vitals for the past 24 hrs: ? BP Temp Temp src Pulse Resp SpO2  ?08/07/21 1004 130/72 98.5 ?F (36.9 ?C) Oral 90 15 98 %  ? ?Constitutional: Well-developed, well-nourished female in no acute distress.  ?Cardiovascular:  normal rate and rhythm. I/VI Systolic murmur ?Respiratory: normal rate and effort. CTAB. No wheezing or stridor ?GI: Abd soft, non-tender, gravid appropriate for gestational age. Pos BS x 4 ?MS: Extremities nontender, no edema, normal ROM ?Neurologic: Alert and oriented x 4.  ?GU: Deferred ? ?+ FHR by doppler ? ?LAB RESULTS ?Results for orders placed or performed during the hospital encounter of 08/07/21 (from the past 24 hour(s))  ?CBC     Status: Abnormal  ? Collection Time: 08/07/21 11:15 AM  ?Result Value Ref Range  ? WBC 10.2 4.0 - 10.5 K/uL  ? RBC 3.80 (L) 3.87 - 5.11 MIL/uL  ? Hemoglobin 11.4 (L) 12.0 - 15.0 g/dL  ? HCT 33.1 (L) 36.0 - 46.0 %  ? MCV 87.1 80.0 - 100.0 fL  ? MCH 30.0 26.0 - 34.0 pg  ? MCHC 34.4 30.0 - 36.0 g/dL  ? RDW 13.9 11.5 - 15.5 %  ? Platelets 313 150 - 400 K/uL  ? nRBC 0.0 0.0 - 0.2 %  ?Comprehensive metabolic panel     Status: Abnormal  ? Collection Time: 08/07/21 11:15 AM  ?Result Value Ref Range  ? Sodium 136 135 - 145 mmol/L  ? Potassium 3.8 3.5 - 5.1 mmol/L  ? Chloride 108 98 - 111 mmol/L  ? CO2 21 (L) 22 - 32 mmol/L  ? Glucose, Bld 85 70 - 99 mg/dL  ? BUN 7 6 - 20 mg/dL  ? Creatinine, Ser 0.52 0.44 - 1.00 mg/dL  ? Calcium 8.9 8.9 - 10.3 mg/dL  ? Total Protein 6.3 (L) 6.5 - 8.1 g/dL  ? Albumin 2.9 (L) 3.5 - 5.0 g/dL  ? AST 26 15 - 41 U/L  ? ALT 53 (H) 0 - 44 U/L  ? Alkaline Phosphatase 66 38 - 126 U/L  ? Total Bilirubin 0.3 0.3 - 1.2 mg/dL  ? GFR, Estimated >60 >60 mL/min  ? Anion gap 7 5 - 15  ? ? ?IMAGING ?NA ? ?MAU COURSE ?Orders Placed This Encounter  ?Procedures  ? CBC  ? Comprehensive metabolic panel  ? EKG 12-Lead  ? Discharge patient  ? ? ?MDM ?- Single episode of tachycardia, SOB and mild HTN that had resolved spontaneously by arrival to MAU likely due to a panic attack. Nml EKG and essentially Nml CB and CMET for pregnancy. Mildly elevated ALT. Upon questioning about possible causes pt stated that she was told that she had a benign mass in her liver that was found  incidentally a few years ago and had not follow-up.  ?2019 CT: "There is a large mass lesion in the right lobe of the liver containing a central scar with peripheral enhancement and ?measuring about 9.5 cm diameter. This could represent focal nodular hyperplasia  or hepatic adenoma. 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 Recommend follow-up with liver protocol MRI in the elective setting." Recommend that pt F/U w/ PCP. Lengthy discussion about stress reduction, resources, seeking out support.  ? ?- HTN noting while at work but in the setting of a panic attack and may have been exacerbated by it. Normotensive in MAU.  ? ?ASSESSMENT ?1. Panic attack   ?2. [redacted] weeks gestation of pregnancy   ?3. Transient hypertension   ? ? ?PLAN ?Discharge home in stable condition. ?Second trimester precautions ?Support given ? Follow-up Information   ? ? [facility] Follow up.   ?Why: Routine prenatal visit or sooner as needed if symptoms worsen ?Contact information: ?[address] ?[place] [address] ?[phone] ? ?  ?  ? ? [facility] Follow up.   ?Specialty: Obstetrics and Gynecology ?Why: As needed in emergencies ?Contact information: ?[address] ?[phone] mc ?[place] [address] ?[phone] ? ?  ?  ? ? McMannes, Jamie C, LCSW Follow up.   ?Specialty: Licensed Clinical Social Worker ? ?  ?  ? ?  ?  ? ?  ? ?Allergies as of 08/07/2021   ? ?   Reactions  ? Eggs Or Egg-derived Products Hives, Swelling  ?  Mother and patient state swelling of throat, itching of throat, hives.  ? Peanut-containing Drug Products Anaphylaxis  ? All nut products per patient  ? Fish Allergy Hives  ? ?  ? ?  ?Medication List  ?  ? ?TAKE these medications   ? ?prenatal vitamin w/FE, FA 29-1 MG Chew chewable tablet ?Chew 1 tablet by mouth daily at 12 noon. ?  ? ?  ? ? ? ?Katrinka Blazing, [place], CNM ?08/07/2021  ?1:15 PM ? ? ? ? ? ? ? ? ? ? ? ? ? ? ? ? ? ?

## 2021-08-18 ENCOUNTER — Ambulatory Visit: Payer: Medicaid Other

## 2021-08-22 NOTE — Progress Notes (Deleted)
  Patient Name: [name] Date of Birth: 1990-05-06 [facility] Prenatal Visit  April [name] is a 31 y.o. G2P1001 at [redacted]w[redacted]d here for routine follow up. She is dated by LMP.  She reports {symptoms; pregnancy related:14538}.  She denies vaginal bleeding.  See flow sheet for details.  There were no vitals filed for this visit. ***   A/P: Pregnancy at [redacted]w[redacted]d.  Doing well.    Routine Prenatal Care:  Dating reviewed, dating tab is {correct:23336::"correct"} Fetal heart tones {appropriate:23337} Influenza vaccine {given:23340}  COVID vaccination was discussed and ***.  The patient has the following indication for screening preexisting diabetes: {Pre-existing diabetes screening:23343::"Reviewed indications for early 1 hour glucose testing, not indicated "}. Anatomy ultrasound ordered to be scheduled at 18-20 weeks. Patient {is/is not:9024} interested in genetic screening. As she is past 13 weeks and 6 days, a {quad:23339::"Quad screen "} was offered.  Pregnancy education including expected weight gain in pregnancy, OTC medication use, continued use of prenatal vitamin, smoking cessation if applicable, and nutrition in pregnancy.   Bleeding and pain precautions reviewed. The patient has the following indications for aspirinto begin 81 mg at 12-16 weeks: One high risk condition: {fmcaspirinobhigh:26167} MORE than one moderate risk condition: {fmcaspirinobmoderate:26168} Aspirin {WAS/WAS NOT:3047324574::"was not"}  recommended today based upon above risk factors (one high risk condition or more than one moderate risk factor)   2. Pregnancy issues include the following and were addressed as appropriate today:  *** Problem list  and pregnancy box updated: {yes/no:20286::"Yes"}.   Follow up 4 weeks.

## 2021-08-23 ENCOUNTER — Ambulatory Visit: Payer: Medicaid Other | Admitting: *Deleted

## 2021-08-23 ENCOUNTER — Ambulatory Visit: Payer: Medicaid Other | Attending: Family Medicine

## 2021-08-23 VITALS — BP 106/50 | HR 90

## 2021-08-23 DIAGNOSIS — Z3A19 19 weeks gestation of pregnancy: Secondary | ICD-10-CM | POA: Insufficient documentation

## 2021-08-23 DIAGNOSIS — Z363 Encounter for antenatal screening for malformations: Secondary | ICD-10-CM | POA: Diagnosis not present

## 2021-08-23 DIAGNOSIS — Z3A15 15 weeks gestation of pregnancy: Secondary | ICD-10-CM

## 2021-08-23 DIAGNOSIS — O99212 Obesity complicating pregnancy, second trimester: Secondary | ICD-10-CM

## 2021-08-24 ENCOUNTER — Other Ambulatory Visit: Payer: Self-pay | Admitting: *Deleted

## 2021-08-24 DIAGNOSIS — Z362 Encounter for other antenatal screening follow-up: Secondary | ICD-10-CM

## 2021-08-24 DIAGNOSIS — O99212 Obesity complicating pregnancy, second trimester: Secondary | ICD-10-CM

## 2021-08-25 ENCOUNTER — Encounter: Payer: Medicaid Other | Admitting: Family Medicine

## 2021-09-02 ENCOUNTER — Ambulatory Visit (INDEPENDENT_AMBULATORY_CARE_PROVIDER_SITE_OTHER): Payer: Medicaid Other | Admitting: Family Medicine

## 2021-09-02 VITALS — BP 107/59 | HR 85 | Wt 186.6 lb

## 2021-09-02 DIAGNOSIS — Z3482 Encounter for supervision of other normal pregnancy, second trimester: Secondary | ICD-10-CM | POA: Diagnosis not present

## 2021-09-02 DIAGNOSIS — N898 Other specified noninflammatory disorders of vagina: Secondary | ICD-10-CM

## 2021-09-02 LAB — POCT WET PREP (WET MOUNT)
Clue Cells Wet Prep Whiff POC: NEGATIVE
Trichomonas Wet Prep HPF POC: ABSENT

## 2021-09-02 MED ORDER — CLOTRIMAZOLE 1 % VA CREA: 1.0000 | TOPICAL_CREAM | Freq: Every day | VAGINAL | 0 refills | Status: AC

## 2021-09-02 NOTE — Patient Instructions (Addendum)
It was great to see you! I will send you a MyChart message with the results of your testing.  We recommend taking a baby aspirin daily for pre-eclampsia prevention. Feel free to read more about this and let us know if you change your mind.   Start thinking about what you'd like to use for contraception after delivery. We will continue to discuss this at future visits.  Please make a follow up appointment in 4 weeks.  Pregnancy Related Return Precautions The follow are signs/symptoms that are abnormal in pregnancy and may require further evaluation by a physician: Go to the MAU at [facility] if: You have cramping/contractions that do not go away with drinking water, especially if they are lasting 30 seconds to 1.5 minutes, coming and going every 5-10 minutes for an hour or more, or are getting stronger and you cannot walk or talk while having a contraction/cramp. Your water breaks.  Sometimes it is a big gush of fluid, sometimes it is just a trickle that keeps getting your underwear wet or running down your legs You have vaginal bleeding.    You do not feel your baby moving like normal.  If you do not, get something to eat and drink (something cold or something with sugar like peanut butter or juice) and lay down and focus on feeling your baby move. If your baby is still not moving like normal, you should go to MAU. You should feel your baby move 6 times in one hour, or 10 times in two hours. You have a persistent headache that does not go away with 1 g of Tylenol, vision changes, chest pain, difficulty breathing, severe pain in your right upper abdomen, worsening leg swelling- these can all be signs of high blood pressure in pregnancy and need to be evaluated by a provider immediately  These are all concerning in pregnancy and if you have any of these I recommend you call your PCP and present to the Maternity Admissions Unit (map below) for further evaluation.  For  any pregnancy-related emergencies, please go to the Maternity Admissions Unit in the [place] [facility] [place] will use hospital Entrance C.    Our clinic number is [phone].

## 2021-09-02 NOTE — Progress Notes (Unsigned)
  Patient Name: April [name] Date of Birth: [birth year] [facility] Prenatal Visit  April [name] is a 31 y.o. G2P1001 at [redacted]w[redacted]d here for routine follow up. She is dated by LMP.  She reports  vaginal irritation for 2 days with some white discharge as well. Thinks she may have yeast infection. She denies vaginal bleeding. See flow sheet for details.  Vitals:   09/02/21 1108  BP: (!) 107/59  Pulse: 85  GU/GYN: Exam performed in the presence of a chaperone. External genitalia within normal limits.  Vaginal mucosa pink, moist, normal rugae.  Nonfriable cervix without lesions, no bleeding noted on speculum exam. Small amount of white discharge in posterior fornix  A/P: Pregnancy at [redacted]w[redacted]d.  Doing well.    Routine Prenatal Care:  Dating reviewed, dating tab is correct Fetal heart tones Appropriate 154 Influenza vaccine not administered as not influenza season.   The patient has been screened for preexisting diabetes. Had normal 1hr gtt (132) on 2/21 Anatomy ultrasound reviewed-- wnl although views were limited due to fetal position; has follow up scan scheduled on 09/21/21 Patient had low risk panorama  Pregnancy education including expected weight gain in pregnancy, OTC medication use, continued use of prenatal vitamin, smoking cessation if applicable, and nutrition in pregnancy.   Bleeding and pain precautions reviewed. The patient has the following indications for aspirinto begin 81 mg at 12-16 weeks: One high risk condition: no single high risk condition  MORE than one moderate risk condition: obesity, low SES  , and identifies as African American  Aspirin was  recommended today based upon above risk factors (one high risk condition or more than one moderate risk factor)-- discussed at length. Patient declines- reports she prefers not to take medications in general.  2. Pregnancy issues include the following and were addressed as appropriate today:  Vaginal  irritation- wet prep positive for yeast today. Rx sent for clotrimazole cream x7 days. Problem list  and pregnancy box updated: Yes.   Follow up 4 weeks.

## 2021-09-13 ENCOUNTER — Encounter: Payer: Self-pay | Admitting: *Deleted

## 2021-09-21 ENCOUNTER — Ambulatory Visit: Payer: Medicaid Other | Admitting: *Deleted

## 2021-09-21 ENCOUNTER — Encounter: Payer: Self-pay | Admitting: *Deleted

## 2021-09-21 ENCOUNTER — Ambulatory Visit: Payer: Medicaid Other | Attending: Obstetrics

## 2021-09-21 ENCOUNTER — Other Ambulatory Visit: Payer: Self-pay | Admitting: *Deleted

## 2021-09-21 VITALS — BP 108/45 | HR 70

## 2021-09-21 DIAGNOSIS — Z3A23 23 weeks gestation of pregnancy: Secondary | ICD-10-CM

## 2021-09-21 DIAGNOSIS — Z362 Encounter for other antenatal screening follow-up: Secondary | ICD-10-CM

## 2021-09-21 DIAGNOSIS — E669 Obesity, unspecified: Secondary | ICD-10-CM | POA: Diagnosis not present

## 2021-09-21 DIAGNOSIS — Z6835 Body mass index (BMI) 35.0-35.9, adult: Secondary | ICD-10-CM

## 2021-09-21 DIAGNOSIS — O99212 Obesity complicating pregnancy, second trimester: Secondary | ICD-10-CM | POA: Diagnosis not present

## 2021-10-01 NOTE — Progress Notes (Signed)
  [facility] Prenatal Visit  April [name] is a 31 y.o. G2P1001 at [redacted]w[redacted]d here for routine follow up. She is dated by LMP.  She reports no complaints. She reports fetal movement. Denies vaginal bleeding, loss of fluid, or contractions.  See flow sheet for details.  A/P: Pregnancy at [redacted]w[redacted]d.  Doing well.   Dating reviewed, dating tab is correct Fetal heart tones Appropriate 155 Fundal height within expected range. 27cm Influenza vaccine not administered as not influenza season.   Screening for gestational diabetes: early 1hr was wnl (132). Will repeat at next appt between 26-28 weeks Pregnancy education completed including: fetal growth, breastfeeding, contraception, and expected weight gain in pregnancy.   The patient does not have a history of Cesarean delivery and no referral to Center for [facility] is indicated Scheduled for [facility] during second trimester on 7/20 at 11am. Preterm labor, bleeding, and pain precautions given.    2. 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 Please discuss during faculty OB clinic and complete paperwork/refer to OBGYN if appropriate.  Problem list and pregnancy box updated: Yes.   Follow up 3 weeks.

## 2021-10-02 ENCOUNTER — Inpatient Hospital Stay (HOSPITAL_COMMUNITY)
Admission: AD | Admit: 2021-10-02 | Discharge: 2021-10-03 | Disposition: A | Discharge status: Home / Self Care | Payer: Medicaid Other | Attending: Obstetrics & Gynecology | Admitting: Obstetrics & Gynecology

## 2021-10-02 ENCOUNTER — Encounter (HOSPITAL_COMMUNITY): Payer: Self-pay | Admitting: Obstetrics & Gynecology

## 2021-10-02 DIAGNOSIS — W500XXA Accidental hit or strike by another person, initial encounter: Secondary | ICD-10-CM | POA: Insufficient documentation

## 2021-10-02 DIAGNOSIS — Z3A25 25 weeks gestation of pregnancy: Secondary | ICD-10-CM

## 2021-10-02 DIAGNOSIS — Z3689 Encounter for other specified antenatal screening: Secondary | ICD-10-CM

## 2021-10-02 DIAGNOSIS — S3991XA Unspecified injury of abdomen, initial encounter: Secondary | ICD-10-CM

## 2021-10-02 DIAGNOSIS — Z3A24 24 weeks gestation of pregnancy: Secondary | ICD-10-CM | POA: Insufficient documentation

## 2021-10-02 DIAGNOSIS — O9A212 Injury, poisoning and certain other consequences of external causes complicating pregnancy, second trimester: Secondary | ICD-10-CM | POA: Insufficient documentation

## 2021-10-02 DIAGNOSIS — O4702 False labor before 37 completed weeks of gestation, second trimester: Secondary | ICD-10-CM | POA: Insufficient documentation

## 2021-10-02 LAB — URINALYSIS, ROUTINE W REFLEX MICROSCOPIC
Bilirubin Urine: NEGATIVE
Glucose, UA: NEGATIVE mg/dL
Hgb urine dipstick: NEGATIVE
Ketones, ur: 80 mg/dL — AB
Nitrite: NEGATIVE
Protein, ur: NEGATIVE mg/dL
Specific Gravity, Urine: 1.023 (ref 1.005–1.030)
pH: 6 (ref 5.0–8.0)

## 2021-10-03 DIAGNOSIS — O9A212 Injury, poisoning and certain other consequences of external causes complicating pregnancy, second trimester: Secondary | ICD-10-CM | POA: Diagnosis not present

## 2021-10-03 DIAGNOSIS — O4702 False labor before 37 completed weeks of gestation, second trimester: Secondary | ICD-10-CM | POA: Diagnosis not present

## 2021-10-03 DIAGNOSIS — S3991XA Unspecified injury of abdomen, initial encounter: Secondary | ICD-10-CM | POA: Diagnosis not present

## 2021-10-03 DIAGNOSIS — W500XXA Accidental hit or strike by another person, initial encounter: Secondary | ICD-10-CM | POA: Diagnosis not present

## 2021-10-03 DIAGNOSIS — Z3A24 24 weeks gestation of pregnancy: Secondary | ICD-10-CM | POA: Diagnosis not present

## 2021-10-03 DIAGNOSIS — Z3689 Encounter for other specified antenatal screening: Secondary | ICD-10-CM | POA: Diagnosis not present

## 2021-10-03 DIAGNOSIS — Z3A25 25 weeks gestation of pregnancy: Secondary | ICD-10-CM | POA: Diagnosis not present

## 2021-10-04 ENCOUNTER — Ambulatory Visit (INDEPENDENT_AMBULATORY_CARE_PROVIDER_SITE_OTHER): Payer: Medicaid Other | Admitting: Family Medicine

## 2021-10-04 ENCOUNTER — Other Ambulatory Visit: Payer: Self-pay

## 2021-10-04 DIAGNOSIS — Z3482 Encounter for supervision of other normal pregnancy, second trimester: Secondary | ICD-10-CM

## 2021-10-27 ENCOUNTER — Ambulatory Visit (INDEPENDENT_AMBULATORY_CARE_PROVIDER_SITE_OTHER): Payer: Medicaid Other | Admitting: Family Medicine

## 2021-10-27 ENCOUNTER — Other Ambulatory Visit: Payer: Self-pay

## 2021-10-27 VITALS — BP 117/57 | HR 95 | Wt 184.0 lb

## 2021-10-27 DIAGNOSIS — Z23 Encounter for immunization: Secondary | ICD-10-CM | POA: Diagnosis not present

## 2021-10-27 DIAGNOSIS — Z3483 Encounter for supervision of other normal pregnancy, third trimester: Secondary | ICD-10-CM

## 2021-10-27 LAB — POCT 1 HR PRENATAL GLUCOSE: Glucose 1 Hr Prenatal, POC: 153 mg/dL

## 2021-10-27 NOTE — Progress Notes (Signed)
  [facility] Prenatal Visit  April [name] is a 31 y.o. G2P1001 at [redacted]w[redacted]d here for routine follow up. She is dated by LMP.  She reports no bleeding, no contractions, no cramping, no leaking, and occasional contractions. She reports fetal movement. She denies vaginal bleeding, contractions, or loss of fluid. See flow sheet for details.  Vitals:   10/27/21 1058  BP: (!) 117/57  Pulse: 95      A/P: Pregnancy at [redacted]w[redacted]d.  Doing well.   Routine prenatal care:  Dating reviewed, dating tab is correct Fetal heart tones Appropriate Fundal height within expected range.  Infant feeding choice: Both  Contraception choice: Undecided  Infant circumcision desired not applicable  The patient does not have a history of Cesarean delivery and no referral to Center for [facility] is indicated Influenza vaccine not administered as not influenza season.   Tdap was not given today. 1 hour glucola, CBC, RPR, and HIV were obtained today.    Rh status was reviewed and patient does not need Rhogam.  Rhogam was not given today.  Pregnancy medical home and PHQ-9 forms were done today and reviewed.   Childbirth and education classes were offered. Patient Declined. Pregnancy education regarding benefits of breastfeeding, contraception, fetal growth, expected weight gain, and safe infant sleep were discussed.  Preterm labor and fetal movement precautions reviewed.  2. Pregnancy issues include the following and were addressed as appropriate today:   Weight loss: 3 pound weight loss from previous visit. Patient is eating well with good appetite. Denies nausea/vomiting.   Problem list and pregnancy box updated: Yes.   Patient scheduled in [facility] during third trimester on 10/27/21.  Follow up 2 weeks.

## 2021-10-27 NOTE — Patient Instructions (Signed)
Please make a follow up appointment in 2 weeks.   Pregnancy Related Return Precautions The follow are signs/symptoms that are abnormal in pregnancy and may require further evaluation by a physician: Go to the [facility] if: You have cramping/contractions that do not go away with drinking water, especially if they are lasting 30 seconds to 1.5 minutes, coming and going every 5-10 minutes for an hour or more, or are getting stronger and you cannot walk or talk while having a contraction/cramp. Your water breaks.  Sometimes it is a big gush of fluid, sometimes it is just a trickle that keeps getting your underwear wet or running down your legs You have vaginal bleeding.    You do not feel your baby moving like normal.  If you do not, get something to eat and drink (something cold or something with sugar like peanut butter or juice) and lay down and focus on feeling your baby move. If your baby is still not moving like normal, you should go to MAU. You should feel your baby move 6 times in one hour, or 10 times in two hours. You have a persistent headache that does not go away with 1 g of Tylenol, vision changes, chest pain, difficulty breathing, severe pain in your right upper abdomen, worsening leg swelling- these can all be signs of high blood pressure in pregnancy and need to be evaluated by a provider immediately  These are all concerning in pregnancy and if you have any of these I recommend you call your PCP and present to the Maternity Admissions Unit (map below) for further evaluation.  For any pregnancy-related emergencies, please go to the Maternity Admissions Unit in the [facility]. You will use hospital Entrance C.    Our clinic number is [phone].   Dr Pray  

## 2021-10-28 ENCOUNTER — Other Ambulatory Visit (INDEPENDENT_AMBULATORY_CARE_PROVIDER_SITE_OTHER): Payer: Medicaid Other

## 2021-10-28 DIAGNOSIS — Z3483 Encounter for supervision of other normal pregnancy, third trimester: Secondary | ICD-10-CM | POA: Diagnosis not present

## 2021-10-28 LAB — COMPREHENSIVE METABOLIC PANEL
ALT: 29 IU/L (ref 0–32)
AST: 21 IU/L (ref 0–40)
Albumin/Globulin Ratio: 1.2 (ref 1.2–2.2)
Albumin: 3.7 g/dL — ABNORMAL LOW (ref 4.0–5.0)
Alkaline Phosphatase: 121 IU/L (ref 44–121)
BUN/Creatinine Ratio: 10 (ref 9–23)
BUN: 6 mg/dL (ref 6–20)
Bilirubin Total: <0.2 mg/dL (ref 0.0–1.2)
CO2: 20 mmol/L (ref 20–29)
Calcium: 9.3 mg/dL (ref 8.7–10.2)
Chloride: 104 mmol/L (ref 96–106)
Creatinine, Ser: 0.58 mg/dL (ref 0.57–1.00)
Globulin, Total: 3 g/dL (ref 1.5–4.5)
Glucose: 133 mg/dL — ABNORMAL HIGH (ref 70–99)
Potassium: 4 mmol/L (ref 3.5–5.2)
Sodium: 137 mmol/L (ref 134–144)
Total Protein: 6.7 g/dL (ref 6.0–8.5)
eGFR: 126 mL/min/{1.73_m2} (ref >59)

## 2021-10-28 LAB — CBC
Hematocrit: 35.3 % (ref 34.0–46.6)
Hemoglobin: 11.9 g/dL (ref 11.1–15.9)
MCH: 29.8 pg (ref 26.6–33.0)
MCHC: 33.7 g/dL (ref 31.5–35.7)
MCV: 88 fL (ref 79–97)
Platelets: 327 10*3/uL (ref 150–450)
RBC: 4 x10E6/uL (ref 3.77–5.28)
RDW: 13.1 % (ref 11.7–15.4)
WBC: 8.3 10*3/uL (ref 3.4–10.8)

## 2021-10-28 LAB — HIV ANTIBODY (ROUTINE TESTING W REFLEX): HIV Screen 4th Generation wRfx: NONREACTIVE

## 2021-10-28 LAB — RPR: RPR Ser Ql: NONREACTIVE

## 2021-10-28 LAB — POCT CBG (FASTING - GLUCOSE)-MANUAL ENTRY: Glucose Fasting, POC: 100 mg/dL — AB (ref 70–99)

## 2021-10-29 LAB — GESTATIONAL GLUCOSE TOLERANCE
Glucose, Fasting: 82 mg/dL (ref 70–94)
Glucose, GTT - 1 Hour: 159 mg/dL (ref 70–179)
Glucose, GTT - 2 Hour: 140 mg/dL (ref 70–154)
Glucose, GTT - 3 Hour: 115 mg/dL (ref 70–139)

## 2021-11-04 ENCOUNTER — Ambulatory Visit: Payer: Self-pay

## 2021-11-04 ENCOUNTER — Ambulatory Visit (HOSPITAL_COMMUNITY)
Admission: EM | Admit: 2021-11-04 | Discharge: 2021-11-04 | Disposition: A | Discharge status: Home / Self Care | Payer: Medicaid Other | Attending: Psychiatry | Admitting: Psychiatry

## 2021-11-04 DIAGNOSIS — F4321 Adjustment disorder with depressed mood: Secondary | ICD-10-CM | POA: Insufficient documentation

## 2021-11-04 DIAGNOSIS — O99340 Other mental disorders complicating pregnancy, unspecified trimester: Secondary | ICD-10-CM | POA: Insufficient documentation

## 2021-11-04 DIAGNOSIS — Z3A00 Weeks of gestation of pregnancy not specified: Secondary | ICD-10-CM | POA: Insufficient documentation

## 2021-11-04 NOTE — ED Provider Notes (Signed)
Behavioral Health Urgent Care Medical Screening Exam  Patient Name: April [name] MRN: [id number] Date of Evaluation: 11/04/21 Chief Complaint:   Diagnosis:  Final diagnoses:  Adjustment disorder with depressed mood    History of Present illness: April [name] is a 31 y.o. female. Patient presents voluntarily to [facility] behavioral health for walk-in assessment.   Patient is assessed, face-to-face, by nurse practitioner, seated in assessment area, no acute distress.  She  is alert and oriented, pleasant and cooperative during assessment.   Patient reports depressed mood worsening for several weeks. Additional depressive symptoms including increased irritability, increased number of tearful episodes, low energy/fatigue.   [name] states "I feel better, a little better just talking to someone."  She shares she has been diagnosed with anxiety in 2017.  She attended outpatient counseling, only 1 visit.  At that time she did not feel outpatient counseling was therapeutic however she would like to revisit outpatient individual counseling.  She is not linked with outpatient psychiatry currently, no current medications.  She denies history of inpatient psychiatric hospitalization.  No family mental health history reported.  Discussed intensive outpatient versus partial hospitalization, patient declines at this time.  She identifies as "shy."  She prefers individual counseling resources.  Recent stressors include strained marriage related to financial concerns. Patient recently made aware of pending eviction causing argument with her spouse.  Current plan involves residing with her sister after eviction.  She is also currently pregnant and feels obligated to "work and make money to be able to provide them (108-year-old son and unborn child, the way I want to."   Patient  presents with depressed mood, tearful affect. She  denies suicidal and homicidal ideations. Denies history of suicide  attempts, denies history of non suicidal self-harm behavior.  Patient easily  contracts verbally for safety with this Clinical research associate.    Patient has normal speech and behavior.  She denies auditory and visual hallucinations.  Patient is able to converse coherently with goal-directed thoughts and no distractibility or preoccupation.  Denies symptoms of paranoia.  Objectively there is no evidence of psychosis/mania or delusional thinking.  April [name] resides in [place] with her husband and 45-year-old son. She denies access to weapons. She is employed in Higher education careers adviser. Patient endorses average sleep and appetite. She denies alcohol and substance use.   Patient offered support and encouragement. She gives verbal consent to speak wit her best friend Jomarie Longs [phone]. Jomarie Longs denies safety concerns and agrees with plan for outpatient follow-up.   Patient and best friend are educated and verbalize understanding of mental health resources and other crisis services in the community. They are instructed to call 911 and present to the nearest emergency room should patient experience any suicidal/homicidal ideation, auditory/visual/hallucinations, or detrimental worsening of mental health condition.    Psychiatric Specialty Exam  Presentation  General Appearance:Casual; Appropriate for Environment  Eye Contact:Good  Speech:Clear and Coherent; Normal Rate  Speech Volume:Normal  Handedness:Right   Mood and Affect  Mood:Depressed  Affect:Depressed; Tearful   Thought Process  Thought Processes:Coherent; Goal Directed; Linear  Descriptions of Associations:Intact  Orientation:Full (Time, Place and Person)  Thought Content:Logical; WDL    Hallucinations:None  Ideas of Reference:None  Suicidal Thoughts:No  Homicidal Thoughts:No   Sensorium  Memory:Immediate Good; Recent Good  Judgment:Good  Insight:Good   Executive Functions  Concentration:Good  Attention  Span:Good  Recall:Good  Fund of Knowledge:Good  Language:Good   Psychomotor Activity  Psychomotor Activity:Normal   Assets  Assets:Communication Skills; Desire for Improvement;  Financial Resources/Insurance; Housing; Intimacy; Physical Health; Leisure Time; Resilience; Social Support   Sleep  Sleep:Good  Number of hours: No data recorded  No data recorded  Physical Exam: Physical Exam Vitals and nursing note reviewed.  Constitutional:      Appearance: Normal appearance. She is well-developed.  HENT:     Head: Normocephalic and atraumatic.     Nose: Nose normal.  Cardiovascular:     Rate and Rhythm: Normal rate.  Pulmonary:     Effort: Pulmonary effort is normal.  Musculoskeletal:        General: Normal range of motion.     Cervical back: Normal range of motion.  Skin:    General: Skin is warm and dry.  Neurological:     Mental Status: She is alert and oriented to person, place, and time.  Psychiatric:        Attention and Perception: Attention and perception normal.        Mood and Affect: Mood is depressed. Affect is tearful.        Speech: Speech normal.        Behavior: Behavior normal. Behavior is cooperative.        Thought Content: Thought content normal.        Cognition and Memory: Cognition and memory normal.        Judgment: Judgment normal.    Review of Systems  Constitutional: Negative.   HENT: Negative.    Eyes: Negative.   Respiratory: Negative.    Cardiovascular: Negative.   Gastrointestinal: Negative.   Genitourinary: Negative.   Musculoskeletal: Negative.   Skin: Negative.   Neurological: Negative.   Psychiatric/Behavioral:  Positive for depression.    Blood pressure 119/64, pulse (!) 101, temperature 99.1 F (37.3 C), temperature source Oral, resp. rate 18, last menstrual period 04/11/2021, SpO2 99 %. There is no height or weight on file to calculate BMI.  Musculoskeletal: Strength & Muscle Tone: within normal limits Gait &  Station: normal Patient leans: N/A   BHUC MSE Discharge Disposition for Follow up and Recommendations: Based on my evaluation the patient does not appear to have an emergency medical condition and can be discharged with resources and follow up care in outpatient services for Medication Management and Individual Therapy Patient reviewed with Dr Nelly Rout.  Follow up with outpatient psychiatry, resources provided.   Lenard Lance, FNP 11/04/2021, 2:19 PM

## 2021-11-04 NOTE — Discharge Instructions (Addendum)
Patient is instructed prior to discharge to:  Take all medications as prescribed by his/her mental healthcare provider. Report any adverse effects and or reactions from the medicines to his/her outpatient provider promptly. Keep all scheduled appointments, to ensure that you are getting refills on time and to avoid any interruption in your medication.  If you are unable to keep an appointment call to reschedule.  Be sure to follow-up with resources and follow-up appointments provided.  Patient has been instructed & cautioned: To not engage in alcohol and or illegal drug use while on prescription medicines. In the event of worsening symptoms, patient is instructed to call the crisis hotline, 911 and or go to the nearest ED for appropriate evaluation and treatment of symptoms. To follow-up with his/her primary care provider for your other medical issues, concerns and or health care needs.    Based on what you have shared, a list of resources for outpatient therapy and psychiatry is provided below to get you started back on treatment.  It is imperative that you follow through with treatment within 5-7 days from the day of discharge to prevent any further risk to your safety or mental well-being.  You are not limited to the list provided.  In case of an urgent crisis, you may contact the Mobile Crisis Unit with [facility] at 1.423-105-4365.        Outpatient Services for Therapy and Medication Management for [facility]  [facility] [address], [place], [address] [phone] phone   New Patient Assessment/Therapy Walk-Ins:  Monday and Wednesday: 8 am until slots are full. Every 1st and 2nd Fridays of the month: 1 pm - 5 pm.  NO ASSESSMENT/THERAPY WALK-INS ON TUESDAYS OR THURSDAYS  New Patient Assessment/Medication Management Walk-Ins:  Monday - Friday:  8 am - 11 am.  For all walk-ins, we ask that you arrive by 7:30 am because patients  will be seen in the order of arrival.  Availability is limited; therefore, you may not be seen on the same day that you walk-in.  Our goal is to serve and meet the needs of our community to the best of our ability. BE SURE TO TAKE THE ELEVATOR TO THE SECOND FLOOR.  Please note that to be eligible for services you must bring an ID or a piece of mail with your name and a [facility] address.    [facility] [address], [address] [place], [place], [address] [phone] phone  [facility] [address], [address] [place], [place], [address] [phone] phone  [facility] [address] [place], [place], [address] [phone] phone  [facility] [address] [place], [address] [phone] phone  [facility], [place] [address], [place], [address] [phone] phone  [facility] [address] [place], [place], [address] [phone] phone [phone] fax  [facility] [address] [place], [place], [address] [phone] phone [phone] fax  [facility] [phone] [address] [place], [place], [address] [phone] phone  Jovita Kussmaul [address] [place], [place], [address]  [phone] phone  [facility] [address] [place], [place], [address]  [phone] phone [phone] fax   Family Resources  [facility] [phone] phone, [phone]  Includes: Long-term coaching, financial education, career development training, life skills classes, individual and family counseling, GED preparation and other, educational supports, health and  wellness supports, childcare resources and parenting education workshops, and non-traditional hours.  [facility] [address] [place], [address] [phone] phone  [facility] [address] [place], [place] [address] [phone]  Services: Child passenger safety seats, diapers, baby clothing, formula/baby food.  Requirements: A referral is needed from a nonprofit agency, [facility] Department of [employer], or the Department of [facility].  Domestic Violence Crisis Line Family Service of the [employer] for shelter and/or safety planning [employer] [phone] (24/7) Alyssa Grove [phone] (24/7)  [facility] [address]nd Floor, [place], [place] [phone]) 641-[phone])  [facility] [place] [address] [place], [place] [phone])  Family Service of the [employer] for shelter and/or safety planning: [employer]: [phone] (24/7) [facility]: [phone] (24/7)  National Domestic Violence Hotline: 1-800-799-SAFE 973-855-8262) National Human Trafficking Hotline: 928-156-9299  For children experiencing homelessness: The [place] Act ([place] [employer] of Public Instruction) Rosario Adie - Liaison [phone] phone paylord2@gcsnc .com Assists with tutorial services, transportation to and from school of origin, school supplies, referrals to community services, other provisions of services that will support the success of a child in school.   Shelters for Families and Women  Room at [employer] of the [place], [employer]. [address] [place] [address] [phone] or [phone] Population served: Pregnant women with or without children  Documents required: Valid ID & Social Security Card  [facility] [address] [place], [place]  [address] [phone] Population served: Families with children  [facility] - Emergency Family Shelter [address] [place], [place], [place] [address] [phone] or [phone] Population served: Families with children.  Corporate treasurer of [employer] [address] [place], [place] [address] [phone] Population Served: Families with children, adult women, and adult men.   Hot Meals  Please call the churches/agencies/organizations listed below to learn more about when and where hot meals are provided.   [facility] Services at [facility] [address], [place], [place] [address]  Aurther Loft [address] [place], [place], [place] [address]  [facility] [employer] and [employer] ([address] [place], [place] [address] Dinner: Tuesdays & Thursdays 6:00PM - 6:30PM  [facility] [address], [place], [place]  [address] [phone]  [facility] [address], [place], [place] [address] [phone]  [facility]  [address], [place], [place] [address] [phone]   Food Pantries  [facility] [address] [place], [place], [place] [address] [phone]. [phone] Visit or call between 8:30am-5pm Eligibility: Valid photo ID & Social Engineer, drilling.greensborourbanministry.[facility] Food Pantry [address] [place], [place] [address] [phone] Wed. & Sat. 2-6pm; appointments preferred Eligibility: Valid Photo ID with accurate address  [facility] [address], [place], [place] [address] (Appointments required for food)  [phone] Tues. 11pm-4pm; Sat. 12-2pm For utility assistance: MWF [phone] www.helpinghandshp.[facility] [employer] [address] [place], [place] [address] [phone] En[employer]Concern.si Thurs. 2-4pm Picture ID, proof of residency in [facility]  [address] [place], [place] [address] [phone] Mon.-Fri. 1:00-4:30pm All assistance is first come, first served and  requires documentation. Call for  info. [email or web address]/[email or web address]  [employer] [facility] [place] [address] [place], [place] [address] [phone] Tuesday and Thursday 9am - 12pm Eligibility: No referral needed [email or web address]   Fair Housing - [place]  To report landlord-tenant concerns or report housing discrimination, please call [phone] or visit https://[email or web address].gov/departments/human-rights/fair-housing.    Fair Housing - [employer]  Learn more about fair housing resources in [employer] by visiting: [email or web address].   [facility] [address] [place], [place] [address] [phone] www.[email or web address]

## 2021-11-04 NOTE — Telephone Encounter (Signed)
  Chief Complaint: Depression, frustration Symptoms: ibid Frequency: ongoing recently worse Pertinent Negatives: Patient denies Self harm Disposition: [x] ED /[] Urgent Care (no appt availability in office) / [] Appointment(In office/virtual)/ []  [facility]/ [] Home Care/ [] Refused Recommended Disposition /[] [facility] Mobile Bus/ []  Follow-up with PCP Additional Notes: PT called with crying and depression. PT is expecting her 2nd child and she is having marital issues and is stability issues. Pt feels like a poor parent. Recommended calling OB and PCP. Gave number to pt for [facility]. Called [facility] and conferenced call with them. Reason for Disposition  [1] Depression AND [2] worsening (e.g., sleeping poorly, less able to do activities of daily living)  Answer Assessment - Initial Assessment Questions 1. CONCERN: "What happened that made you call today?"     No - just can't stop crying 2. DEPRESSION SYMPTOM SCREENING: "How are you feeling overall?" (e.g., decreased energy, increased sleeping or difficulty sleeping, difficulty concentrating, feelings of sadness, guilt, hopelessness, or worthlessness)     terrible 3. RISK OF HARM - SUICIDAL IDEATION:  "Do you ever have thoughts of hurting or killing yourself?"  (e.g., yes, no, no but preoccupation with thoughts about death)   - INTENT:  "Do you have thoughts of hurting or killing yourself right NOW?" (e.g., yes, no, N/A)   - PLAN: "Do you have a specific plan for how you would do this?" (e.g., gun, knife, overdose, no plan, N/A)     no 4. RISK OF HARM - HOMICIDAL IDEATION:  "Do you ever have thoughts of hurting or killing someone else?"  (e.g., yes, no, no but preoccupation with thoughts about death)   - INTENT:  "Do you have thoughts of hurting or killing someone right NOW?" (e.g., yes, no, N/A)   - PLAN: "Do you have a specific plan for how you would do this?" (e.g., gun, knife, no plan, N/A)      no 5. FUNCTIONAL IMPAIRMENT: "How have  things been going for you overall? Have you had more difficulty than usual doing your normal daily activities?"  (e.g., better, same, worse; self-care, school, work, interactions)     poorly 6. SUPPORT: "Who is with you now?" "Who do you live with?" "Do you have family or friends who you can talk to?"      no 7. THERAPIST: "Do you have a counselor or therapist? Name?"     no 8. STRESSORS: "Has there been any new stress or recent changes in your life?"     Marriage 9. ALCOHOL USE OR SUBSTANCE USE (DRUG USE): "Do you drink alcohol or use any illegal drugs?"     no 10. OTHER: "Do you have any other physical symptoms right now?" (e.g., fever)       no 11. PREGNANCY: "Is there any chance you are pregnant?" "When was your last menstrual period?"       yes  Protocols used: Depression-A-AH

## 2021-11-04 NOTE — ED Triage Notes (Signed)
Pt presents to [facility] voluntarily due to worsening depression symptoms. Pt was recommended for an evaluation after calling the crisis line. Pt reports multiple stressors including; marital issues,financial issues, she was evicted and she is pregnant. Pt denies SI/HI and AVH

## 2021-11-04 NOTE — BH Assessment (Signed)
LCSW Progress Note   Per Doran Heater, NP, this pt does not require psychiatric hospitalization at this time.  Pt is psychiatrically cleared.  Discharge instructions include several resources for outpatient therapy and medication management, shelters, food pantries, and places to get a hot meal.  EDP Doran Heater, NP, has been notified.  Hansel Starling, MSW, LCSW [facility] [phone] or [phone]

## 2021-11-14 ENCOUNTER — Ambulatory Visit (INDEPENDENT_AMBULATORY_CARE_PROVIDER_SITE_OTHER): Payer: Medicaid Other | Admitting: Family Medicine

## 2021-11-14 VITALS — BP 108/73 | HR 105 | Wt 183.8 lb

## 2021-11-14 DIAGNOSIS — B3731 Acute candidiasis of vulva and vagina: Secondary | ICD-10-CM | POA: Diagnosis not present

## 2021-11-14 DIAGNOSIS — Z3483 Encounter for supervision of other normal pregnancy, third trimester: Secondary | ICD-10-CM

## 2021-11-14 DIAGNOSIS — N898 Other specified noninflammatory disorders of vagina: Secondary | ICD-10-CM

## 2021-11-14 LAB — POCT WET PREP (WET MOUNT)
Clue Cells Wet Prep Whiff POC: NEGATIVE
Trichomonas Wet Prep HPF POC: ABSENT

## 2021-11-14 MED ORDER — CLOTRIMAZOLE 1 % VA CREA: TOPICAL_CREAM | VAGINAL | 1 refills | Status: DC

## 2021-11-14 NOTE — Patient Instructions (Signed)
It was great to see you!  Your testing was positive for yeast. I will send clotrimazole cream to your pharmacy by the end of the day today.  Please make a follow up appointment in 2 weeks.  If you're ever in crisis, please return to the [facility] on [address] or call the suicide hotline (988).  Pregnancy Related Return Precautions The follow are signs/symptoms that are abnormal in pregnancy and may require further evaluation by a physician: Go to the MA[facility] at [facility] if: You have cramping/contractions that do not go away with drinking water, especially if they are lasting 30 seconds to 1.5 minutes, coming and going every 5-10 minutes for an hour or more, or are getting stronger and you cannot walk or talk while having a contraction/cramp. Your water breaks.  Sometimes it is a big gush of fluid, sometimes it is just a trickle that keeps getting your underwear wet or running down your legs You have vaginal bleeding.    You do not feel your baby moving like normal.  If you do not, get something to eat and drink (something cold or something with sugar like peanut butter or juice) and lay down and focus on feeling your baby move. If your baby is still not moving like normal, you should go to MAU. You should feel your baby move 6 times in one hour, or 10 times in two hours. You have a persistent headache that does not go away with 1 g of Tylenol, vision changes, chest pain, difficulty breathing, severe pain in your right upper abdomen, worsening leg swelling- these can all be signs of high blood pressure in pregnancy and need to be evaluated by a provider immediately  These are all concerning in pregnancy and if you have any of these I recommend you call your PCP and present to the Maternity Admissions Unit (map below) for further evaluation.  For any pregnancy-related emergencies, please go to the Maternity Admissions Unit in the [facility]  Center at [facility]. You will use hospital Entrance C.

## 2021-11-14 NOTE — Progress Notes (Signed)
  [facility] Prenatal Visit  April [name] is a 31 y.o. G2P1001 at [redacted]w[redacted]d here for routine follow up. She is dated by LMP = 14w ultrasound.  She reports  vaginal discharge and slight irritation for the past few days . H/o recurrent yeast infections. She reports fetal movement. She denies vaginal bleeding, contractions, or loss of fluid.  See flow sheet for details.  Vitals:   11/14/21 1529  BP: 108/73  Pulse: (!) 105    A/P: Pregnancy at [redacted]w[redacted]d.  Doing well.   Routine prenatal care:  Dating reviewed, dating tab is correct Fetal heart tones: Appropriate Fundal height: within expected range.  The patient does not have a history of HSV and valacyclovir is not indicated at this time.  The patient does not have a history of Cesarean delivery and no referral to [facility] is indicated Infant feeding choice: Breastfeeding Contraception choice: Highly considering BTL-- forms signed today Infant circumcision desired? not applicable Influenza vaccine not administered as not influenza season.   Tdap was given previously (10/27/21) Childbirth and education classes were offered previously- patient declined Pregnancy education regarding benefits of breastfeeding, contraception, fetal growth, expected weight gain, and safe infant sleep were discussed.  Preterm labor and fetal movement precautions reviewed.  2. Pregnancy issues include the following and were addressed as appropriate today:  Recurrent yeast infection: wet prep positive for yeast. Has had 5 infections in past 6 months. Will treat with topical clotrimazole x7 days followed by maintenance therapy twice weekly. Consider obtaining speciation rather than regular wet prep if persistent symptoms.  Adjustment disorder with depressed mood: seen at [facility] on 11/04/21 for depressed mood in the setting of housing eviction. Today states she is "depressed about me not being where I want to be in life" financially. No SI.  Declines therapy resources. Is interested in seeing social work after delivery.  Poor weight gain: -1lb TWG, but pre-G BMI in obese range. Eats 2 meals per day, does not think her mood is a factor, states she also lost weight with her prior pregnancy. Continue to monitor.  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    Follow up 2 weeks.

## 2021-11-29 ENCOUNTER — Encounter: Payer: Medicaid Other | Admitting: Student

## 2021-11-29 ENCOUNTER — Ambulatory Visit (INDEPENDENT_AMBULATORY_CARE_PROVIDER_SITE_OTHER): Payer: Medicaid Other | Admitting: Student

## 2021-11-29 VITALS — BP 107/64 | HR 93 | Wt 183.2 lb

## 2021-11-29 DIAGNOSIS — Z3483 Encounter for supervision of other normal pregnancy, third trimester: Secondary | ICD-10-CM

## 2021-11-29 NOTE — Progress Notes (Signed)
  [facility] Prenatal Visit  April [name] is a 31 y.o. G2P1001 at [redacted]w[redacted]d here for routine follow up. She is dated by LMP= 14w ultrasound  She reports no complaints.  She reports fetal movement. She denies vaginal bleeding, contractions, or loss of fluid.  See flow sheet for details.  *Discussed BTL- likely not getting it. IUD (post-placental?) PHQ-9 0  Vitals:   11/29/21 1358  BP: 107/64  Pulse: 93     A/P: Pregnancy at [redacted]w[redacted]d.  Doing well.   Routine prenatal care:  Dating reviewed, dating tab is correct Fetal heart tones: Appropriate- 150s bpm Fundal height: within expected range.  The patient does not have a history of HSV and valacyclovir is not indicated at this time.  The patient does not have a history of Cesarean delivery and no referral to [facility] is indicated Infant feeding choice: Breastfeeding Contraception choice: Undecided - signed BTL paperwork, but feels she is not interested in this anymore. Discussed other options at length including post-placental IUD, which is of interest to her. Infant circumcision desired not applicable Influenza vaccine not administered as not influenza season.   Tdap was given previously (10/27/21). Childbirth and education classes were offered previously- declined. Pregnancy education regarding benefits of breastfeeding, contraception, fetal growth, expected weight gain, and safe infant sleep were discussed.  Preterm labor and fetal movement precautions reviewed.   2. Pregnancy issues include the following and were addressed as appropriate today:  Adjustment disorder with depressed mood: Previously seen at [facility] for depressed mood in light of housing eviction. Denies any feelings of anxiety or depression today. PHQ-0 score 0. Her affect was a bit flat but she was pleasant with good eye contact during visit. Discussed resources should she need them.   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 Concern  for possibility of increased growth in pregnancy. Plan for liver MRI postpartum and consider RUQ [place] in the meantime.  Problem list and pregnancy box updated: Yes.   Follow up 2 weeks.

## 2021-11-29 NOTE — Patient Instructions (Addendum)
It was great to meet you today.  We will plan to see you again in 2 weeks.   If you're ever in crisis, please return to the [facility] on [address] or call the suicide hotline (988).   Pregnancy Related Return Precautions The follow are signs/symptoms that are abnormal in pregnancy and may require further evaluation by a physician: Go to the MAU at [facility] if: You have cramping/contractions that do not go away with drinking water, especially if they are lasting 30 seconds to 1.5 minutes, coming and going every 5-10 minutes for an hour or more, or are getting stronger and you cannot walk or talk while having a contraction/cramp. Your water breaks.  Sometimes it is a big gush of fluid, sometimes it is just a trickle that keeps getting your underwear wet or running down your legs You have vaginal bleeding.    You do not feel your baby moving like normal.  If you do not, get something to eat and drink (something cold or something with sugar like peanut butter or juice) and lay down and focus on feeling your baby move. If your baby is still not moving like normal, you should go to MAU. You should feel your baby move 6 times in one hour, or 10 times in two hours. You have a persistent headache that does not go away with 1 g of Tylenol, vision changes, chest pain, difficulty breathing, severe pain in your right upper abdomen, worsening leg swelling- these can all be signs of high blood pressure in pregnancy and need to be evaluated by a provider immediately   These are all concerning in pregnancy and if you have any of these I recommend you call your PCP and present to the Maternity Admissions Unit (map below) for further evaluation.   For any pregnancy-related emergencies, please go to the Maternity Admissions Unit in the [facility] at [facility]. You will use hospital Entrance C.   Be well,  Dr. Darral Dash [facility]  Family Medicine [phone]

## 2021-11-30 ENCOUNTER — Ambulatory Visit: Payer: Medicaid Other | Attending: Maternal & Fetal Medicine

## 2021-11-30 ENCOUNTER — Ambulatory Visit: Payer: Medicaid Other | Admitting: *Deleted

## 2021-11-30 VITALS — BP 110/53 | HR 88

## 2021-11-30 DIAGNOSIS — O99213 Obesity complicating pregnancy, third trimester: Secondary | ICD-10-CM | POA: Diagnosis not present

## 2021-11-30 DIAGNOSIS — Z363 Encounter for antenatal screening for malformations: Secondary | ICD-10-CM | POA: Diagnosis not present

## 2021-11-30 DIAGNOSIS — Z6835 Body mass index (BMI) 35.0-35.9, adult: Secondary | ICD-10-CM

## 2021-11-30 DIAGNOSIS — Z3A33 33 weeks gestation of pregnancy: Secondary | ICD-10-CM | POA: Diagnosis not present

## 2021-11-30 DIAGNOSIS — E669 Obesity, unspecified: Secondary | ICD-10-CM

## 2021-12-01 ENCOUNTER — Telehealth: Payer: Self-pay | Admitting: Family Medicine

## 2021-12-01 ENCOUNTER — Other Ambulatory Visit: Payer: Self-pay | Admitting: Family Medicine

## 2021-12-01 DIAGNOSIS — R160 Hepatomegaly, not elsewhere classified: Secondary | ICD-10-CM

## 2021-12-01 NOTE — Telephone Encounter (Signed)
Called and discsused with patient getting repeat US to assess liver lesion and if it has grown at all since 2019. Discussed I was reaching out to MFM to see if any other suggestions based on [place] results. She was appreciative of this, message sent to staff to schedule [place] and call her with date and time and [place] ordered RUQ to be scheduled at [facility].

## 2021-12-05 ENCOUNTER — Telehealth: Payer: Self-pay | Admitting: *Deleted

## 2021-12-05 NOTE — Telephone Encounter (Signed)
Contacted pt and gave her the below information for her [place].   [facility], enter through entrance C 9:30am on 12/06/21. [place] Rumple, CMA

## 2021-12-05 NOTE — Telephone Encounter (Signed)
-----   Message from Billey Co, MD sent at 12/01/2021 12:28 PM EDT ----- Regarding: Please scehdule liver US next available and call patient Hi team,  Can we please call and schedule a [place] RUQ for previous liver mass at [facility] next available, and then call patient with date and time of appointment. I have called patient and let her know to expect to have this scheduled.  Thanks, Dr Miquel Dunn

## 2021-12-06 ENCOUNTER — Ambulatory Visit (HOSPITAL_COMMUNITY)
Admission: RE | Admit: 2021-12-06 | Discharge: 2021-12-06 | Disposition: A | Discharge status: Home / Self Care | Payer: Medicaid Other | Source: Ambulatory Visit | Attending: Family Medicine | Admitting: Family Medicine

## 2021-12-06 DIAGNOSIS — K769 Liver disease, unspecified: Secondary | ICD-10-CM | POA: Diagnosis not present

## 2021-12-06 DIAGNOSIS — R160 Hepatomegaly, not elsewhere classified: Secondary | ICD-10-CM | POA: Insufficient documentation

## 2021-12-08 ENCOUNTER — Telehealth: Payer: Self-pay | Admitting: Family Medicine

## 2021-12-08 DIAGNOSIS — R160 Hepatomegaly, not elsewhere classified: Secondary | ICD-10-CM

## 2021-12-08 NOTE — Telephone Encounter (Signed)
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 Discussed stability in size suggests against cancer, but that MFM recommends referral to general surgery which I will place today and will be discussing her case at an upcoming time to discuss safest delivery plan as these can be at risk for rupture and hemorrhage at the time of delivery. Understandingly, patient was a little overwhelmed with this information. Denies questions at this time, general surgery referral placed, discsused I will keep her posted when I hear back from Dr Parke Poisson and she has appt with Dr Larita Fife next week that she can bring questions to as well.  Burley Saver MD

## 2021-12-15 NOTE — Patient Instructions (Addendum)
It was wonderful to meet you today. Thank you for allowing me to be a part of your care. Below is a short summary of what we discussed at your visit today:  Pregnancy Keep taking your prenatal vitamin.   Today we collected a vaginal swab to test for Group B strep bacteria, gonorrhea, and chlamydia. If the results are normal, I will send you a letter or MyChart message. If the results are abnormal, I will give you a call.    Liver mass With your liver mass (found previously on imaging in 2019) we will plan to get an MRI of your liver after you deliver.  We will let you know when we hear from the MFM specialist regarding delivery plan.   Prenatal Classes Go to [email or web address] for more information on the pregnancy and child birth classes that [place] has to offer.   Emergency Planning If you experience any vaginal bleeding, leakage of fluids, don't feel your baby moving as much, or start to have contractions less than 5 minutes apart please go directly to the Maternal Assessment Unit at [facility] for evaluation.  Maternity and women's care services located on the [place] side of [facility]. [facility] (Entrance C off [address]).  [address] [place],  [place]  [address]     Please bring all of your medications to every appointment!  If you have any questions or concerns, please do not hesitate to contact us via phone or MyChart message.   Fayette Pho, MD

## 2021-12-15 NOTE — Progress Notes (Unsigned)
  [facility] Prenatal Visit  April [name] is a 31 y.o. G2P1001 at [redacted]w[redacted]d for routine follow up. She reports some contractions after intercourse and more vaginal discharge of mucous consistency.   She reports fetal movement. She denies vaginal bleeding, contractions, or loss of fluid.   See flow sheet for details.  Vitals:   12/16/21 0938  BP: 110/70  Pulse: 100   A/P: Pregnancy at [redacted]w[redacted]d.  Doing well.   Routine prenatal care:  Dating reviewed, dating tab is correct Fetal heart tones: Appropriate Fundal height: within expected range.  Infant feeding choice: breast feeding Contraception choice: Patient has signed BTL paperwork, but at this time is leaning towards post-placental IUD Infant circumcision desired not applicable Tdap previously given 10/27/2021 GBS/GC/CZ testing was performed today. Preterm labor precautions reviewed. Safe sleep discussed. Kick counts reviewed.  2. Pregnancy issues include the following and were addressed as appropriate today:  Poor weight gain: down one pound since last prenatal visit. She reports due to appetite. Will continue to monitor.   Recurrent vaginal candidiasis: Previously started on topical clotrimazole x7 days followed by maintenance therapy twice weekly. She reports she is no longer using given resolution of symptoms.   History of stable benign liver mass 9 cm: Working with MFM for safe delivery plan. As of right now, she needs routine low-risk prenatal care, can continue here at [facility].   Problem list and pregnancy box updated: Yes.   Previously seen in faculty OB clinic 10/27/2021 at 28 weeks. Scheduled for next appointments on 9/21, 9/27, and 10/4.   Fayette Pho, MD

## 2021-12-16 ENCOUNTER — Ambulatory Visit (INDEPENDENT_AMBULATORY_CARE_PROVIDER_SITE_OTHER): Payer: Medicaid Other | Admitting: Family Medicine

## 2021-12-16 ENCOUNTER — Other Ambulatory Visit (HOSPITAL_COMMUNITY)
Admission: RE | Admit: 2021-12-16 | Discharge: 2021-12-16 | Disposition: A | Discharge status: Home / Self Care | Payer: Medicaid Other | Source: Ambulatory Visit | Attending: Family Medicine | Admitting: Family Medicine

## 2021-12-16 VITALS — BP 110/70 | HR 100 | Wt 182.0 lb

## 2021-12-16 DIAGNOSIS — B3731 Acute candidiasis of vulva and vagina: Secondary | ICD-10-CM

## 2021-12-16 DIAGNOSIS — Z3483 Encounter for supervision of other normal pregnancy, third trimester: Secondary | ICD-10-CM

## 2021-12-19 LAB — CERVICOVAGINAL ANCILLARY ONLY
Chlamydia: NEGATIVE
Comment: NEGATIVE
Comment: NORMAL
Neisseria Gonorrhea: NEGATIVE

## 2021-12-20 ENCOUNTER — Telehealth: Payer: Self-pay | Admitting: Family Medicine

## 2021-12-20 LAB — CULTURE, BETA STREP (GROUP B ONLY): Strep Gp B Culture: POSITIVE — AB

## 2021-12-20 NOTE — Telephone Encounter (Signed)
Called to discuss results. Patient confirmed with DOB. Discussed GBS and need for prophylaxis at time of delivery. Patient confirmed no history of penicillin allergy. Also relayed negative gonorrhea and chlamydia testing.   Fayette Pho, MD

## 2021-12-21 ENCOUNTER — Telehealth: Payer: Self-pay | Admitting: Family Medicine

## 2021-12-21 NOTE — Telephone Encounter (Signed)
Called [facility] [place] [facility]- pt has appt Monday 12/26/21 with Dr Freida Busman.  Called Dr Parke Poisson with MFM, reaching out to [facility] group to discuss plan of care as they labor the [facility] patients, planning for likely induction of labor during weekday at [facility] with general surgery on call for back up for liver adenoma, possibly around 39 WGA.  Will continue to coordinate care with MFM and OB and general surgery, for now otherwise routine prenatal care.  Burley Saver MD

## 2021-12-22 ENCOUNTER — Ambulatory Visit (INDEPENDENT_AMBULATORY_CARE_PROVIDER_SITE_OTHER): Payer: Medicaid Other | Admitting: Family Medicine

## 2021-12-22 VITALS — BP 110/69 | HR 85 | Wt 186.4 lb

## 2021-12-22 DIAGNOSIS — O98819 Other maternal infectious and parasitic diseases complicating pregnancy, unspecified trimester: Secondary | ICD-10-CM

## 2021-12-22 DIAGNOSIS — Z3483 Encounter for supervision of other normal pregnancy, third trimester: Secondary | ICD-10-CM

## 2021-12-22 DIAGNOSIS — B951 Streptococcus, group B, as the cause of diseases classified elsewhere: Secondary | ICD-10-CM

## 2021-12-22 NOTE — Patient Instructions (Addendum)
It was nice seeing you today!  Go to the MAU if you develop vaginal bleeding, frequent contractions, or leakage of fluid.  Stay well, Littie Deeds, MD [facility] [phone]  --  Make sure to check out at the front desk before you leave today.  Please arrive at least 15 minutes prior to your scheduled appointments.  If you had blood work today, I will send you a MyChart message or a letter if results are normal. Otherwise, I will give you a call.  If you had a referral placed, they will call you to set up an appointment. Please give [place] a call if you don't hear back in the next 2 weeks.  If you need additional refills before your next appointment, please call your pharmacy first.

## 2021-12-22 NOTE — Progress Notes (Signed)
  [facility] Prenatal Visit  April [name] is a 31 y.o. G2P1001 at [redacted]w[redacted]d here for routine follow up. She is dated by LMP.  She reports occasional contractions. She reports fetal movement. She denies vaginal bleeding, contractions, or loss of fluid. See flow sheet for details.  She has upcoming appointment with general surgery to discuss liver mass.  She has questions about birth control options possibly being implicated in the liver lesion which appears most likely FNH or hepatic adenoma based on ultrasound characteristics.  Vitals:   12/22/21 0915  BP: 110/69  Pulse: 85    A/P: Pregnancy at [redacted]w[redacted]d.  Doing well.   Routine prenatal care  Dating reviewed, dating tab is correct Fetal heart tones Appropriate 138 Fundal height within expected range.  36cm Fetal position confirmed Vertex using Ultrasound .  GBS not collected today due to previously collected. Marland Kitchen GBS+ Repeat GC/CT not collected today due to previously collected.  The patient does not have a history of HSV and valacyclovir is not indicated at this time.  Infant feeding choice: Breastfeeding Contraception choice: Undecided  thinking Nexplanon vs IUD Infant circumcision desired not applicable Influenza vaccine not administered as not influenza season.  Vaccine not in stock at clinic. Tdap previously administered between 27-36 weeks 10/27/21 Pregnancy education regarding preterm labor, fetal movement,  benefits of breastfeeding, contraception, fetal growth, expected weight gain, and safe infant sleep were discussed.     2. Pregnancy issues include the following and were addressed as appropriate today:   Liver lesion: upcoming appointment with general surgery, will need continued coordination between MFM and general surgery  GBS positive: needs intrapartum PCN  Problem list and pregnancy box updated: Yes.  Follow up 1 week.

## 2021-12-26 DIAGNOSIS — R160 Hepatomegaly, not elsewhere classified: Secondary | ICD-10-CM | POA: Diagnosis not present

## 2021-12-26 DIAGNOSIS — K7689 Other specified diseases of liver: Secondary | ICD-10-CM | POA: Diagnosis not present

## 2021-12-27 ENCOUNTER — Telehealth: Payer: Self-pay | Admitting: Family Medicine

## 2021-12-27 NOTE — Telephone Encounter (Signed)
Called and confirmed I was speaking with Ms April [name].  Saw Dr Zenia Resides with [facility] yesterday on 12/26/21.  She does not remember specific conversations around delivery planning, but does not they discussed getting liver MRI 1 month postpartum to reassess.   I called and left VM and my number with Dr Zenia Resides at [facility] to discuss. Copying MFM and OB for delivery planning.   Ms April [name] has f/u appt in 2 days at [facility].  Yehuda Savannah MD

## 2021-12-29 ENCOUNTER — Ambulatory Visit (INDEPENDENT_AMBULATORY_CARE_PROVIDER_SITE_OTHER): Payer: Medicaid Other | Admitting: Family Medicine

## 2021-12-29 ENCOUNTER — Other Ambulatory Visit: Payer: Self-pay

## 2021-12-29 ENCOUNTER — Telehealth: Payer: Self-pay | Admitting: Family Medicine

## 2021-12-29 VITALS — BP 124/80 | HR 93 | Wt 186.0 lb

## 2021-12-29 DIAGNOSIS — R160 Hepatomegaly, not elsewhere classified: Secondary | ICD-10-CM

## 2021-12-29 DIAGNOSIS — Z3483 Encounter for supervision of other normal pregnancy, third trimester: Secondary | ICD-10-CM

## 2021-12-29 NOTE — Progress Notes (Signed)
  [place] Prenatal Visit  April [name] is a 31 y.o. G2P1001 at [redacted]w[redacted]d here for routine follow up. She is dated by LMP = 14 week ultrasound.  She reports no complaints. She reports fetal movement. She denies vaginal bleeding, contractions, or loss of fluid. See flow sheet for details.  Vitals:   12/29/21 1006  BP: 124/80  Pulse: 93    A/P: Pregnancy at [redacted]w[redacted]d.  Doing well.   Routine prenatal care  Dating reviewed, dating tab is correct Fetal heart tones Appropriate 133bpm Fundal height within expected range. 37cm Fetal position confirmed Vertex using Leopold's . Confirmed Vertex on ultrasound at last appointment. GBS collected previously-- GBS+, needs intrapartum abx.  Repeat GC/CT collected previously- normal results The patient does not have a history of HSV and valacyclovir is not indicated at this time.  Infant feeding choice: Both  Contraception choice: Undecided : outpatient IUD vs Nexplanon. Prefers to decide postpartum, has enough going on with her liver mass situation. Infant circumcision: not applicable Influenza vaccine not administered as patient declined, will continue to discuss.   Tdap previously administered between 27-36 weeks  Pregnancy education regarding fetal movement,  benefits of breastfeeding, contraception, fetal growth were discussed.    2. 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 Followed by gen surg (Dr Zenia Resides) and case discussed with MFM (Dr Annamaria Boots). Ok for vaginal delivery but recommend scheduled IOL to have appropriate resources/personnel should complications arise. Risks/benefits discussed in detail with patient today and she is agreeable for IOL which was scheduled for 10/2. See attending attestation for additional details.  History of depressed mood: doing well currently, PHQ score 0. Patient does wish to see  social work postpartum.  Problem list and pregnancy box updated: Yes.  Follow up 1 week.

## 2021-12-29 NOTE — Telephone Encounter (Signed)
Note: late entry, discusesd with Dr Zenia Resides on 12/27/21 and updated MFM on 12/28/21.

## 2021-12-29 NOTE — Patient Instructions (Signed)
It was great to see you!  Pregnancy Related Return Precautions The follow are signs/symptoms that are abnormal in pregnancy and may require further evaluation by a physician: Go to the MAU at [place] at [facility] if: You have cramping/contractions that do not go away with drinking water, especially if they are lasting 30 seconds to 1.5 minutes, coming and going every 5-10 minutes for an hour or more, or are getting stronger and you cannot walk or talk while having a contraction/cramp. Your water breaks.  Sometimes it is a big gush of fluid, sometimes it is just a trickle that keeps getting your underwear wet or running down your legs You have vaginal bleeding.    You do not feel your baby moving like normal.  If you do not, get something to eat and drink (something cold or something with sugar like peanut butter or juice) and lay down and focus on feeling your baby move. If your baby is still not moving like normal, you should go to MAU. You should feel your baby move 6 times in one hour, or 10 times in two hours. You have a persistent headache that does not go away with 1 g of Tylenol, vision changes, chest pain, difficulty breathing, severe pain in your right upper abdomen, worsening leg swelling- these can all be signs of high blood pressure in pregnancy and need to be evaluated by a provider immediately  These are all concerning in pregnancy and if you have any of these I recommend you call your PCP and present to the Maternity Admissions Unit (map below) for further evaluation.  For any pregnancy-related emergencies, please go to the Maternity Admissions Unit in the [place] at [place] will use hospital Entrance C.

## 2021-12-29 NOTE — Telephone Encounter (Signed)
Spoke with Dr Zenia Resides from [facility] about Ms April [name]. She notes that MRI is not needed until postpartum. She has no other specific recommendations for delivery, notes she should be able to labor vaginally and if any complications with liver mass arise general surgery will be available for consult.  Information relayed to Dr Annamaria Boots with MFM and [facility] OB attending. Awaiting further recommendations if scheduled induction would be recommended.   Yehuda Savannah MD

## 2022-01-02 ENCOUNTER — Encounter (HOSPITAL_COMMUNITY): Payer: Self-pay | Admitting: *Deleted

## 2022-01-02 ENCOUNTER — Telehealth (HOSPITAL_COMMUNITY): Payer: Self-pay | Admitting: *Deleted

## 2022-01-02 NOTE — Telephone Encounter (Signed)
Preadmission screen  

## 2022-01-04 ENCOUNTER — Other Ambulatory Visit: Payer: Self-pay

## 2022-01-04 ENCOUNTER — Other Ambulatory Visit: Payer: Self-pay | Admitting: Advanced Practice Midwife

## 2022-01-04 ENCOUNTER — Ambulatory Visit (INDEPENDENT_AMBULATORY_CARE_PROVIDER_SITE_OTHER): Payer: Medicaid Other | Admitting: Family Medicine

## 2022-01-04 VITALS — BP 119/52 | HR 93 | Wt 185.0 lb

## 2022-01-04 DIAGNOSIS — O99820 Streptococcus B carrier state complicating pregnancy: Secondary | ICD-10-CM | POA: Diagnosis not present

## 2022-01-04 DIAGNOSIS — Z3A38 38 weeks gestation of pregnancy: Secondary | ICD-10-CM

## 2022-01-04 DIAGNOSIS — B951 Streptococcus, group B, as the cause of diseases classified elsewhere: Secondary | ICD-10-CM

## 2022-01-04 DIAGNOSIS — O99891 Other specified diseases and conditions complicating pregnancy: Secondary | ICD-10-CM

## 2022-01-04 DIAGNOSIS — O98819 Other maternal infectious and parasitic diseases complicating pregnancy, unspecified trimester: Secondary | ICD-10-CM

## 2022-01-04 DIAGNOSIS — R160 Hepatomegaly, not elsewhere classified: Secondary | ICD-10-CM

## 2022-01-04 DIAGNOSIS — Z3483 Encounter for supervision of other normal pregnancy, third trimester: Secondary | ICD-10-CM

## 2022-01-04 NOTE — Progress Notes (Signed)
  [facility] Prenatal Visit  April [name] is a 31 y.o. G2P1001 at [redacted]w[redacted]d here for routine follow up. She is dated by LMP = 14 week u/s.  She reports no complaints. She reports fetal movement. She denies vaginal bleeding, contractions, or loss of fluid. See flow sheet for details.  Vitals:   01/04/22 0952  BP: (!) 119/52  Pulse: 93    A/P: Pregnancy at [redacted]w[redacted]d.  Doing well.   Routine prenatal care:  Dating reviewed, dating tab is correct Fetal heart tones Appropriate Fundal height within expected range.  Fetal position confirmed Vertex using Leopold's .  Infant feeding choice: Both  Contraception choice: Undecided - outpatient IUD vs outpatient Nexplanon Infant circumcision? not applicable Pain control in labor discussed and patient desires IV pain medication. Trying to go without epidural but is not opposed to it.  Influenza vaccine not administered as patient declined, will continue to discuss.   Tdap previously administered between 27-36 weeks  GBS and gc/chlamydia testing results were reviewed- GBS+, STI testing unremarkable Pregnancy education regarding labor, fetal movement,  benefits of breastfeeding, contraception, and safe infant sleep were discussed.  Labor and fetal movement precautions reviewed.  2. 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 Followed by gen surg (Dr Zenia Resides) and case has been discussed extensively with MFM (Dr Annamaria Boots). OK for vaginal delivery-- patient is scheduled for IOL at 39 WGA. Specialists will be available in-house on that day should complications arise. AVOID fundal pressure.  H/o depressed mood: doing well currently, PHQ-9 score of 0. She does wish to speak with social work postpartum.  Problem list and pregnancy box updated: Yes.   Follow up 1 week.

## 2022-01-04 NOTE — Patient Instructions (Signed)
It was great to see you!  Congratulations in advance! I can't wait to meet [facility]!   Pregnancy Related Return Precautions The follow are signs/symptoms that are abnormal in pregnancy and may require further evaluation by a physician: Go to the MAU at [place] at [facility] if: You have cramping/contractions that do not go away with drinking water, especially if they are lasting 30 seconds to 1.5 minutes, coming and going every 5-10 minutes for an hour or more, or are getting stronger and you cannot walk or talk while having a contraction/cramp. Your water breaks.  Sometimes it is a big gush of fluid, sometimes it is just a trickle that keeps getting your underwear wet or running down your legs You have vaginal bleeding.    You do not feel your baby moving like normal.  If you do not, get something to eat and drink (something cold or something with sugar like peanut butter or juice) and lay down and focus on feeling your baby move. If your baby is still not moving like normal, you should go to MAU. You should feel your baby move 6 times in one hour, or 10 times in two hours. You have a persistent headache that does not go away with 1 g of Tylenol, vision changes, chest pain, difficulty breathing, severe pain in your right upper abdomen, worsening leg swelling- these can all be signs of high blood pressure in pregnancy and need to be evaluated by a provider immediately  These are all concerning in pregnancy and if you have any of these I recommend you call your PCP and present to the Maternity Admissions Unit (map below) for further evaluation.  For any pregnancy-related emergencies, please go to the Maternity Admissions Unit in the [place] [facility] [place] will use hospital Entrance C.

## 2022-01-06 ENCOUNTER — Other Ambulatory Visit: Payer: Self-pay | Admitting: Surgery

## 2022-01-06 DIAGNOSIS — K7689 Other specified diseases of liver: Secondary | ICD-10-CM

## 2022-01-07 ENCOUNTER — Inpatient Hospital Stay (HOSPITAL_COMMUNITY)
Admission: AD | Admit: 2022-01-07 | Discharge: 2022-01-08 | DRG: 807 | Disposition: A | Discharge status: Home / Self Care | Payer: Medicaid Other | Attending: Family Medicine | Admitting: Family Medicine

## 2022-01-07 ENCOUNTER — Encounter (HOSPITAL_COMMUNITY): Payer: Self-pay | Admitting: Family Medicine

## 2022-01-07 DIAGNOSIS — B951 Streptococcus, group B, as the cause of diseases classified elsewhere: Secondary | ICD-10-CM | POA: Diagnosis present

## 2022-01-07 DIAGNOSIS — O99820 Streptococcus B carrier state complicating pregnancy: Secondary | ICD-10-CM | POA: Diagnosis not present

## 2022-01-07 DIAGNOSIS — R160 Hepatomegaly, not elsewhere classified: Secondary | ICD-10-CM | POA: Diagnosis present

## 2022-01-07 DIAGNOSIS — Z3490 Encounter for supervision of normal pregnancy, unspecified, unspecified trimester: Secondary | ICD-10-CM

## 2022-01-07 DIAGNOSIS — Z3A38 38 weeks gestation of pregnancy: Secondary | ICD-10-CM | POA: Diagnosis not present

## 2022-01-07 DIAGNOSIS — O4202 Full-term premature rupture of membranes, onset of labor within 24 hours of rupture: Secondary | ICD-10-CM | POA: Diagnosis not present

## 2022-01-07 DIAGNOSIS — O26893 Other specified pregnancy related conditions, third trimester: Secondary | ICD-10-CM | POA: Diagnosis not present

## 2022-01-07 DIAGNOSIS — F419 Anxiety disorder, unspecified: Secondary | ICD-10-CM | POA: Diagnosis present

## 2022-01-07 DIAGNOSIS — O99824 Streptococcus B carrier state complicating childbirth: Secondary | ICD-10-CM | POA: Diagnosis not present

## 2022-01-07 DIAGNOSIS — O99893 Other specified diseases and conditions complicating puerperium: Secondary | ICD-10-CM | POA: Diagnosis not present

## 2022-01-07 LAB — CBC
HCT: 36.3 % (ref 36.0–46.0)
Hemoglobin: 12.7 g/dL (ref 12.0–15.0)
MCH: 29.7 pg (ref 26.0–34.0)
MCHC: 35 g/dL (ref 30.0–36.0)
MCV: 85 fL (ref 80.0–100.0)
Platelets: 274 10*3/uL (ref 150–400)
RBC: 4.27 MIL/uL (ref 3.87–5.11)
RDW: 14 % (ref 11.5–15.5)
WBC: 13 10*3/uL — ABNORMAL HIGH (ref 4.0–10.5)
nRBC: 0 % (ref 0.0–0.2)

## 2022-01-07 LAB — TYPE AND SCREEN
ABO/RH(D): A POS
Antibody Screen: NEGATIVE

## 2022-01-07 MED ORDER — TERBUTALINE SULFATE 1 MG/ML IJ SOLN: 0.2500 mg | Freq: Once | INTRAMUSCULAR | Status: DC | PRN

## 2022-01-07 MED ORDER — SIMETHICONE 80 MG PO CHEW: 80.0000 mg | CHEWABLE_TABLET | ORAL | Status: DC | PRN

## 2022-01-07 MED ORDER — SOD CITRATE-CITRIC ACID 500-334 MG/5ML PO SOLN: 30.0000 mL | ORAL | Status: DC | PRN

## 2022-01-07 MED ORDER — OXYTOCIN BOLUS FROM INFUSION: 333.0000 mL | Freq: Once | INTRAVENOUS | Status: DC

## 2022-01-07 MED ORDER — SODIUM CHLORIDE 0.9 % IV SOLN: 250.0000 mL | INTRAVENOUS | Status: DC | PRN

## 2022-01-07 MED ORDER — IBUPROFEN 600 MG PO TABS
600.0000 mg | ORAL_TABLET | Freq: Four times a day (QID) | ORAL | Status: DC
  Administered 2022-01-07 – 2022-01-08 (×6): 600 mg via ORAL
  Filled 2022-01-07 (×6): qty 1

## 2022-01-07 MED ORDER — DIBUCAINE (PERIANAL) 1 % EX OINT: 1.0000 | TOPICAL_OINTMENT | CUTANEOUS | Status: DC | PRN

## 2022-01-07 MED ORDER — LACTATED RINGERS IV SOLN: INTRAVENOUS | Status: DC

## 2022-01-07 MED ORDER — OXYCODONE-ACETAMINOPHEN 5-325 MG PO TABS: 1.0000 | ORAL_TABLET | ORAL | Status: DC | PRN

## 2022-01-07 MED ORDER — ONDANSETRON HCL 4 MG/2ML IJ SOLN: 4.0000 mg | Freq: Four times a day (QID) | INTRAMUSCULAR | Status: DC | PRN

## 2022-01-07 MED ORDER — BENZOCAINE-MENTHOL 20-0.5 % EX AERO: 1.0000 | INHALATION_SPRAY | CUTANEOUS | Status: DC | PRN

## 2022-01-07 MED ORDER — ONDANSETRON HCL 4 MG/2ML IJ SOLN: 4.0000 mg | INTRAMUSCULAR | Status: DC | PRN

## 2022-01-07 MED ORDER — PRENATAL MULTIVITAMIN CH
1.0000 | ORAL_TABLET | Freq: Every day | ORAL | Status: DC
  Administered 2022-01-07 – 2022-01-08 (×2): 1 via ORAL
  Filled 2022-01-07 (×2): qty 1

## 2022-01-07 MED ORDER — OXYTOCIN-SODIUM CHLORIDE 30-0.9 UT/500ML-% IV SOLN: 1.0000 m[IU]/min | INTRAVENOUS | Status: DC

## 2022-01-07 MED ORDER — WITCH HAZEL-GLYCERIN EX PADS: 1.0000 | MEDICATED_PAD | CUTANEOUS | Status: DC | PRN

## 2022-01-07 MED ORDER — METHYLERGONOVINE MALEATE 0.2 MG/ML IJ SOLN
INTRAMUSCULAR | Status: AC
  Filled 2022-01-07: qty 1

## 2022-01-07 MED ORDER — METHYLERGONOVINE MALEATE 0.2 MG/ML IJ SOLN
0.2000 mg | Freq: Once | INTRAMUSCULAR | Status: AC
  Administered 2022-01-07: 0.2 mg via INTRAMUSCULAR

## 2022-01-07 MED ORDER — ACETAMINOPHEN 325 MG PO TABS: 650.0000 mg | ORAL_TABLET | ORAL | Status: DC | PRN

## 2022-01-07 MED ORDER — OXYTOCIN-SODIUM CHLORIDE 30-0.9 UT/500ML-% IV SOLN: 2.5000 [IU]/h | INTRAVENOUS | Status: DC

## 2022-01-07 MED ORDER — SODIUM CHLORIDE 0.9 % IV SOLN: 5.0000 10*6.[IU] | Freq: Once | INTRAVENOUS | Status: DC

## 2022-01-07 MED ORDER — LACTATED RINGERS IV SOLN: 500.0000 mL | INTRAVENOUS | Status: DC | PRN

## 2022-01-07 MED ORDER — OXYTOCIN-SODIUM CHLORIDE 30-0.9 UT/500ML-% IV SOLN
INTRAVENOUS | Status: AC
  Filled 2022-01-07: qty 500

## 2022-01-07 MED ORDER — OXYCODONE-ACETAMINOPHEN 5-325 MG PO TABS: 2.0000 | ORAL_TABLET | ORAL | Status: DC | PRN

## 2022-01-07 MED ORDER — PENICILLIN G POT IN DEXTROSE 60000 UNIT/ML IV SOLN: 3.0000 10*6.[IU] | INTRAVENOUS | Status: DC

## 2022-01-07 MED ORDER — SENNOSIDES-DOCUSATE SODIUM 8.6-50 MG PO TABS
2.0000 | ORAL_TABLET | ORAL | Status: DC
  Administered 2022-01-07 – 2022-01-08 (×2): 2 via ORAL
  Filled 2022-01-07 (×2): qty 2

## 2022-01-07 MED ORDER — COCONUT OIL OIL: 1.0000 | TOPICAL_OIL | Status: DC | PRN

## 2022-01-07 MED ORDER — ZOLPIDEM TARTRATE 5 MG PO TABS: 5.0000 mg | ORAL_TABLET | Freq: Every evening | ORAL | Status: DC | PRN

## 2022-01-07 MED ORDER — SODIUM CHLORIDE 0.9% FLUSH: 3.0000 mL | Freq: Two times a day (BID) | INTRAVENOUS | Status: DC

## 2022-01-07 MED ORDER — LIDOCAINE HCL (PF) 1 % IJ SOLN
30.0000 mL | INTRAMUSCULAR | Status: DC | PRN
  Filled 2022-01-07: qty 30

## 2022-01-07 MED ORDER — ONDANSETRON HCL 4 MG PO TABS: 4.0000 mg | ORAL_TABLET | ORAL | Status: DC | PRN

## 2022-01-07 MED ORDER — SODIUM CHLORIDE 0.9% FLUSH: 3.0000 mL | INTRAVENOUS | Status: DC | PRN

## 2022-01-07 MED ORDER — OXYTOCIN 10 UNIT/ML IJ SOLN: 10.0000 [IU] | Freq: Once | INTRAMUSCULAR | Status: DC

## 2022-01-07 MED ORDER — OXYTOCIN 10 UNIT/ML IJ SOLN
INTRAMUSCULAR | Status: AC
  Filled 2022-01-07: qty 1

## 2022-01-07 MED ORDER — DIPHENHYDRAMINE HCL 25 MG PO CAPS: 25.0000 mg | ORAL_CAPSULE | Freq: Four times a day (QID) | ORAL | Status: DC | PRN

## 2022-01-07 NOTE — Discharge Summary (Signed)
Postpartum Discharge Summary     Patient Name: April [name] DOB: [birth year] MRN: [id number]  Date of admission: 01/07/2022 Delivery date:01/07/2022  Delivering provider: Deloris Ping  Date of discharge: 01/08/2022  Admitting diagnosis: Encounter for induction of labor [Z34.90] Intrauterine pregnancy: [redacted]w[redacted]d    Secondary diagnosis:  Principal Problem:   Vaginal delivery Active Problems:   Anxiety and depression   Supervision of normal pregnancy   Liver mass   Group B streptococcal infection in pregnancy  Additional problems: n/a    Discharge diagnosis: Term Pregnancy Delivered                                              Post partum procedures: n/a Augmentation: N/A Complications: None  Hospital course: Onset of Labor With Vaginal Delivery      31y.o. yo G2P1001 at 384w5das admitted in Active Labor on 01/07/2022. Patient had an uncomplicated labor course as follows:  Membrane Rupture Time/Date: 11:30 PM ,01/06/2022   Delivery Method:Vaginal, Spontaneous  Episiotomy: None  Lacerations:    Patient had an uncomplicated postpartum course.  She is ambulating, tolerating a regular diet, passing flatus, and urinating well. Patient is discharged home in stable condition on 01/08/22.  Newborn Data: Birth date:01/07/2022  Birth time:12:35 AM  Gender:Female  Living status:Living  Apgars:9 ,9  Weight:2840 g   Magnesium Sulfate received: No BMZ received: No Rhophylac:N/A MMR:N/A T-DaP:Given prenatally Flu: N/A Transfusion:No  Physical exam  Vitals:   01/07/22 1200 01/07/22 1715 01/07/22 2040 01/08/22 0502  BP: 123/63 116/72 115/65 94/78  Pulse: 94 93 83 83  Resp: 16 17 17 18   Temp: 98.7 F (37.1 C) 98 F (36.7 C) 98.2 F (36.8 C) 97.9 F (36.6 C)  TempSrc: Axillary Oral Oral Oral  SpO2: 99% 99% 98% 100%   General: alert, cooperative, and no distress Lochia: appropriate Uterine Fundus: firm Incision: n/a DVT Evaluation: No evidence of DVT seen on  physical exam. Labs: Lab Results  Component Value Date   WBC 13.0 (H) 01/07/2022   HGB 12.7 01/07/2022   HCT 36.3 01/07/2022   MCV 85.0 01/07/2022   PLT 274 01/07/2022      Latest Ref Rng & Units 10/27/2021    2:03 PM  CMP  Glucose 70 - 99 mg/dL 133   BUN 6 - 20 mg/dL 6   Creatinine 0.57 - 1.00 mg/dL 0.58   Sodium 134 - 144 mmol/L 137   Potassium 3.5 - 5.2 mmol/L 4.0   Chloride 96 - 106 mmol/L 104   CO2 20 - 29 mmol/L 20   Calcium 8.7 - 10.2 mg/dL 9.3   Total Protein 6.0 - 8.5 g/dL 6.7   Total Bilirubin 0.0 - 1.2 mg/dL <0.2   Alkaline Phos 44 - 121 IU/L 121   AST 0 - 40 IU/L 21   ALT 0 - 32 IU/L 29    Edinburgh Score:    01/08/2022    7:17 AM  Edinburgh Postnatal Depression Scale Screening Tool  I have been able to laugh and see the funny side of things. 0  I have looked forward with enjoyment to things. 0  I have blamed myself unnecessarily when things went wrong. 0  I have been anxious or worried for no good reason. 0  I have felt scared or panicky for no good reason. 0  Things  have been getting on top of me. 0  I have been so unhappy that I have had difficulty sleeping. 0  I have felt sad or miserable. 0  I have been so unhappy that I have been crying. 0  The thought of harming myself has occurred to me. 0  Edinburgh Postnatal Depression Scale Total 0     After visit meds:  Allergies as of 01/08/2022       Reactions   Eggs Or Egg-derived Products Hives, Swelling    Mother and patient state swelling of throat, itching of throat, hives.   Peanut-containing Drug Products Anaphylaxis   All nut products per patient   Fish Allergy Hives        Medication List     TAKE these medications    acetaminophen 325 MG tablet Commonly known as: Tylenol Take 2 tablets (650 mg total) by mouth every 4 (four) hours as needed (for pain scale < 4).   ibuprofen 600 MG tablet Commonly known as: ADVIL Take 1 tablet (600 mg total) by mouth every 6 (six) hours.    prenatal vitamin w/FE, FA 29-1 MG Chew chewable tablet Chew 1 tablet by mouth daily at 12 noon.         Discharge home in stable condition Infant Feeding: Breast Infant Disposition:home with mother Discharge instruction: per After Visit Summary and Postpartum booklet. Activity: Advance as tolerated. Pelvic rest for 6 weeks.  Diet: routine diet Future Appointments: Future Appointments  Date Time Provider [place]  01/11/2022  9:30 AM Alcus Dad, MD [facility]  02/10/2022 11:10 AM Alcus Dad, MD [facility] [place]   Follow up Visit:  Message sent 01/08/22 to [facility]  Please schedule this patient for a In person postpartum visit in 6 weeks with the following provider: Any provider. Additional Postpartum F/U:Postpartum Depression checkup  Low risk pregnancy complicated by:  n/a Delivery mode:  Vaginal, Spontaneous  Anticipated Birth Control:   nexplanon vs IUD outpatient   01/08/2022 Naaman Plummer [place], DO

## 2022-01-07 NOTE — H&P (Addendum)
OBSTETRIC ADMISSION HISTORY AND PHYSICAL  April [name] is a 31 y.o. female G2P1001 with IUP at [redacted]w[redacted]d presenting for SOL. She reports +FMs. No LOF, VB, blurry vision, headaches, peripheral edema, or RUQ pain. She plans on breast and bottle feeding. She requests Nexplanon or outpatient IUD.  Dating: By LMP --->  Estimated Date of Delivery: 01/16/22  Sono:    @[redacted]w[redacted]d , normal anatomy, Cephalic presentation, 6378H, 17%ile, EFW 4lb 5oz   Prenatal History/Complications: Liver mass followed by general surgery Dr. Zenia Resides, and discussed with MFM. Patient okay for vaginal birth. No fundal pressure during delivery per MFM.  Group B strep positive  Past Medical History: Past Medical History:  Diagnosis Date   Liver mass    9cm mass MRI after delivery   Medical history non-contributory     Past Surgical History: Past Surgical History:  Procedure Laterality Date   NO PAST SURGERIES     WISDOM TOOTH EXTRACTION  2013    Obstetrical History: OB History     Gravida  2   Para  1   Term  1   Preterm  0   AB  0   Living  1      SAB  0   IAB  0   Ectopic  0   Multiple  0   Live Births  1           Social History: Social History   Socioeconomic History   Marital status: Married    Spouse name: Not on file   Number of children: Not on file   Years of education: Not on file   Highest education level: Not on file  Occupational History   Not on file  Tobacco Use   Smoking status: Never   Smokeless tobacco: Never  Vaping Use   Vaping Use: Never used  Substance and Sexual Activity   Alcohol use: No   Drug use: No   Sexual activity: Yes    Birth control/protection: None, Condom  Other Topics Concern   Not on file  Social History Narrative   Lives with mother Rowe Robert, 3 younger siblings Richada, Darrien and Nyjha.    Social Determinants of Health   Financial Resource Strain: Not on file  Food Insecurity: Not on file  Transportation Needs: Not  on file  Physical Activity: Not on file  Stress: Not on file  Social Connections: Not on file    Family History: Family History  Problem Relation Age of Onset   Diabetes Maternal Aunt    Diabetes Maternal Uncle    Hypertension Maternal Uncle    Diabetes Maternal Grandmother    Hypertension Maternal Grandmother    Cancer Maternal Grandfather    Hearing loss Neg Hx    Stroke Neg Hx    Asthma Neg Hx     Allergies: Allergies  Allergen Reactions   Eggs Or Egg-Derived Products Hives and Swelling     Mother and patient state swelling of throat, itching of throat, hives.   Peanut-Containing Drug Products Anaphylaxis    All nut products per patient   Fish Allergy Hives    Medications Prior to Admission  Medication Sig Dispense Refill Last Dose   prenatal vitamin w/FE, FA (NATACHEW) 29-1 MG CHEW chewable tablet Chew 1 tablet by mouth daily at 12 noon.   01/06/2022     Review of Systems:  All systems reviewed and negative except as stated in HPI  PE: Last menstrual period 04/11/2021. General appearance: alert, cooperative,  appears stated age, and moderate distress Lungs: regular rate and effort Heart: regular rate  Abdomen: soft, non-tender Extremities: Homans sign is negative, no sign of DVT Presentation: cephalic EFM: Patient presentation to delivery time to rapid for EFM. Toco: Regular, every 2-3 minutes   SVE: N/A  Prenatal labs: ABO, Rh: A/Positive/-- (02/15 2482) Antibody: Negative (02/15 5003) Rubella: 6.01 (02/15 7048) RPR: Non Reactive (07/20 1403)  HBsAg: Negative (02/15 8891)  HIV: Non Reactive (07/20 1403)  GBS: Positive/-- (09/08 1714)  2 hr GTT: negative  Prenatal Transfer Tool  Maternal Diabetes: No Genetic Screening: Normal Maternal Ultrasounds/Referrals: Normal Fetal Ultrasounds or other Referrals:  Referred to Materal Fetal Medicine  Maternal Substance Abuse:  No Significant Maternal Medications:  None Significant Maternal Lab Results: Group  B Strep positive  No results found for this or any previous visit (from the past 24 hour(s)).  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 G2P1001 at [redacted]w[redacted]d here for SOL.  1. Labor: Expectant management of spontaneous vaginal delivery. 2. FWB: N/A 3. Pain: Natural birth 4. GBS: positive, patient delivered at this time, intrapartum antibiotics unable to be given, observe baby for 48hrs 5. Liver mass: Postpartum GI follow-up.   Plan: Admit to L&D.  Celine Mans, MD  01/07/2022, 12:35 AM   I confirm that I have verified and agree with the information documented in the resident's note.   Carlynn Herald, CNM 01/07/2022 5:02 AM

## 2022-01-07 NOTE — Lactation Note (Addendum)
This note was copied from a baby's chart. Lactation Consultation Note  Patient Name: April [name] [id number] Date: 01/07/2022 Reason for consult: Initial assessment;Early term 37-38.6wks;Breastfeeding assistance;Difficult latch Age:29 hours  P2, Early Term, Infant Female  Per the birth parent, the infant "[place]" has been breastfeeding, but she is unsure about how much she is getting at the breast.  LC spoke with the birth parent about supply and demand and milk production.  Per the birth parent, she exclusively pumped for 1 month with her son, but she never produced enough.  LC encouraged the birth parent to continue to put the infant to the breast.  The birth parent did not have a breast pump at home.  LC gave her a manual pump and encouraged her to reach out to her insurance company for a pump.  LC explained watching pump parts and milk storage. The birth parent used a size #24 flange and stated that it was comfortable.  The birth parent was given colostrum collector bottles.  LC demonstrated hand expression and no drops were noted.  LC encouraged the birth parent to call for breastfeeding assistance.   Current Feeding Plan:  Breastfeed 8+ times in 24 hours according to feeding cues.  Hand express and spoon feed the expressed milk to the infant via a spoon.  Call [facility] for assistance with breastfeeding.   Maternal Data Has patient been taught Hand Expression?: Yes Does the patient have breastfeeding experience prior to this delivery?: Yes How long did the patient breastfeed?: 1 month  Feeding Mother's Current Feeding Choice: Breast Milk and Formula Nipple Type: Slow - flow  LATCH Score                    Lactation Tools Discussed/Used    Interventions Interventions: Breast feeding basics reviewed;Education;LC Services brochure;Hand express;Breast massage  Discharge Pump: Personal;Manual  Consult Status Consult Status: Follow-up Date:  01/08/22 Follow-up type: In-patient    Elly Modena Srinivas Lippman 01/07/2022, 2:24 PM

## 2022-01-07 NOTE — MAU Note (Signed)
Upon entry to room - pt was reporting feeling rectal pressure. FHR 145-155bpm x2  min. SVE First attending called and notified of pts advanced dilatation and need for admission. LD charge nurse called for room. Pt transferred via stretcher with Fatima Blank in attendance for transfer. Pt remains well controlled with contractions .

## 2022-01-08 ENCOUNTER — Ambulatory Visit: Payer: Self-pay

## 2022-01-08 MED ORDER — IBUPROFEN 600 MG PO TABS: 600.0000 mg | ORAL_TABLET | Freq: Four times a day (QID) | ORAL | 0 refills | Status: DC

## 2022-01-08 MED ORDER — ACETAMINOPHEN 325 MG PO TABS: 650.0000 mg | ORAL_TABLET | ORAL | 0 refills | Status: DC | PRN

## 2022-01-08 NOTE — Lactation Note (Signed)
This note was copied from a baby's chart. Lactation Consultation Note  Patient Name: April [name] Today's Date: 01/08/2022   Age:29 hours Birth Parent has latched infant once at the breast since delivery, infant has been formula feeding, infant was given 25 mls of formula at 1700 pm. Birth Parent has decided to "pump only" and formula feed infant. [facility] set Birth Parent up with DEBP, and Birth Parent was pumping as LC left the room. Birth Parent knows to pump every 3 hours for 15 minutes on initial setting and give infant any EBM first before formula. Birth Parent know EBM is safe at room temperature for 4 hours, whereas formula must be used within 1 hour once open.  Birth Parent plans to continue to feed infant according to cues, 8 to 12 times within 24 hours.  Birth Parent knows to call [facility] if their are any BF questions or concerns.  Maternal Data    Feeding Nipple Type: Slow - flow  LATCH Score                    Lactation Tools Discussed/Used    Interventions    Discharge    Consult Status      Vicente Serene 01/08/2022, 5:36 PM

## 2022-01-08 NOTE — Progress Notes (Signed)
CSW received consult for hx of Anxiety and Depression. CSW met with MOB to offer support and complete assessment. When CSW entered room, MOB was observed sitting in bed bonding with infant "Nova." MOB was observed holding infant and responded appropriately to her cues. CSW introduced self and explained reason for consult. MOB presented as calm and welcomed CSW . MOB remained engaged throughout encounter.   CSW inquired how MOB has felt emotionally since giving birth. MOB reports she has felt "overwhelmed." MOB reports symptoms of depression and anxiety beginning 7 months into her pregnancy. MOB reports she was stressed a lot during pregnancy due to going through "a lot of changes." MOB reports she has felt sad more than usual. MOB reports she has had to move several times during her pregnancy due to limited income. MOB reports FOB has legal history and it is difficult for him to secure a job and MOB was sick during her pregnancy, making it difficult for her to work. MOB reports she and FOB are currently split up, and they are both living with family. MOB reports she is currently living with her grandmother and her 39 year old son, sharing it is "comfortable" in her grandmother's home. MOB reports she can remain at her grandmother's house and feels safe there. MOB reports she was first diagnosed with depression and anxiety in 2017. MOB reports she was prescribed medication for anxiety in 2018 but was unable to recall the name of the medication. MOB reports the medication was helpful but declined interest in psychotropic medications at this time. MOB reports she attended 1 therapy session in 2017 and recalls it was helpful. However, MOB reports she has difficulty opening up to others and shares she does not like being vulnerable. MOB reports she copes by "keep moving forward." CSW inquired if MOB is interested in a mental health resource list in the case she would like additional support. MOB declined, sharing her  doctor's office has already provided her a list. CSW and MOB discussed alternative coping skills, such as going outside and journaling. MOB denies a history of postpartum depression. MOB identified her grandparents, and her aunts and uncles as supports. MOB denied current SI/HI/DV.   MOB reports she has all needed items for infant, including a car seat and bassinet. MOB has chosen [facility] as infant's pediatrician office. MOB reports she receives [facility] and food stamps. CSW encouraged MOB to notify her caseworkers of infant's birth to have infant added to benefits. MOB declined additional resource needs at this time.  CSW provided education regarding the baby blues period vs. perinatal mood disorders, discussed treatment and gave resources for mental health follow up if concerns arise.  CSW recommends self-evaluation during the postpartum time period using the New Mom Checklist from Postpartum Progress and encouraged MOB to contact a medical professional if symptoms are noted at any time.    CSW provided review of Sudden Infant Death Syndrome (SIDS) precautions.    CSW identifies no further need for intervention and no barriers to discharge at this time.  Signed,  Berniece Salines, MSW, [place], LCASA 01/08/2022 12:58 PM

## 2022-01-09 ENCOUNTER — Ambulatory Visit: Payer: Self-pay

## 2022-01-09 ENCOUNTER — Inpatient Hospital Stay (HOSPITAL_COMMUNITY): Admission: RE | Admit: 2022-01-09 | Payer: Medicaid Other | Source: Ambulatory Visit

## 2022-01-09 NOTE — Lactation Note (Signed)
This note was copied from a baby's chart. Lactation Consultation Note  Patient Name: April [name] [id number] Date: 01/09/2022 Reason for consult: Follow-up assessment;Difficult latch;Term;Breastfeeding assistance Age:29 hours  P2, Early Term, Infant Female, 2.11% WL  LC entered the room and the infant was being held by the support person.  Per the birth parent, she has been pumping and seeing "clear stuff".  She asked if the clear liquid was colostrum.  LC let the birth parent know that it was colostrum.  The birth parent stated that she had been throwing the colostrum away.  LC encouraged her to feed the colostrum to the infant and to try to pump q3hrs.  The birth parent is currently exclusively pumping and supplementing with formula until her milk volume increases.  LC spoke with the birth parent about engorgement, breast care, milk production, infant I/O, and outpatient services.  The birth parent stated that all questions had been answered.   Current Feeding Plan:  Pump q3hrs and feed EBM to the infant via a bottle.  Supplement with formula according to supplementation guidelines.  Watch the infant's output and call the pediatrician with questions or concerns.  Call outpatient [facility] for assistance with breastfeeding.   Feeding Mother's Current Feeding Choice: Breast Milk and Formula  Interventions Interventions: Breast feeding basics reviewed;Education  Discharge Discharge Education: Engorgement and breast care;Warning signs for feeding baby;Outpatient recommendation  Consult Status Consult Status: Complete Date: 01/09/22 Follow-up type: Call as needed    Lysbeth Penner 01/09/2022, 12:02 PM

## 2022-01-11 ENCOUNTER — Encounter: Payer: Self-pay | Admitting: Family Medicine

## 2022-01-16 ENCOUNTER — Telehealth (HOSPITAL_COMMUNITY): Payer: Self-pay

## 2022-01-16 NOTE — Telephone Encounter (Signed)
Phone line rings and says call cannot be completed as dialed.   01/16/22,1951

## 2022-01-27 ENCOUNTER — Ambulatory Visit (INDEPENDENT_AMBULATORY_CARE_PROVIDER_SITE_OTHER): Payer: Medicaid Other | Admitting: Student

## 2022-01-27 ENCOUNTER — Encounter: Payer: Self-pay | Admitting: Student

## 2022-01-27 VITALS — BP 116/72 | HR 69 | Wt 169.9 lb

## 2022-01-27 DIAGNOSIS — R160 Hepatomegaly, not elsewhere classified: Secondary | ICD-10-CM

## 2022-01-27 DIAGNOSIS — Z3009 Encounter for other general counseling and advice on contraception: Secondary | ICD-10-CM | POA: Diagnosis not present

## 2022-01-27 DIAGNOSIS — Z309 Encounter for contraceptive management, unspecified: Secondary | ICD-10-CM | POA: Insufficient documentation

## 2022-01-27 NOTE — Progress Notes (Signed)
  SUBJECTIVE:   CHIEF COMPLAINT / HPI:   Contraceptive management: Interested in nexplanon, she was on it previously. Did not experience any breakthrough bleeding with the nexplanon. Her periods have not returned yet. Generally has normal periods.  She is not currently sexually active.  PERTINENT  PMH / PSH: N/A  OBJECTIVE:  BP 116/72   Pulse 69   Wt 169 lb 14.4 oz (77.1 kg)   LMP 04/11/2021   SpO2 98%   Breastfeeding Yes   BMI 31.08 kg/m  Physical Exam Constitutional:      Appearance: Normal appearance.  Neurological:     Mental Status: She is alert.  Psychiatric:        Mood and Affect: Mood normal.        Behavior: Behavior normal.    ASSESSMENT/PLAN:  Encounter for other general counseling or advice on contraception Assessment & Plan: Desiring Nexplanon implant.  Counseled on options, no estrogen products given breast-feeding status.  Use barrier method if returning to sexual activity in the near future.  Advised making appointment for Nexplanon placement.   Liver mass Assessment & Plan: Reviewed, advise GI follow-up.  Orders: -     Ambulatory referral to Gastroenterology  Return in about 1 week (around 02/03/2022) for Nexplanon placement. Wells Guiles, DO 01/27/2022, 12:01 PM PGY-2, [place]

## 2022-01-27 NOTE — Patient Instructions (Signed)
It was great to see you today! Thank you for choosing [facility] for your primary care. April [name] was seen for contraceptive counseling.  Today we addressed: Once get you scheduled for a Nexplanon placement.  I do recommend that in the meantime you abstain from sexual activity or use other method of barrier contraception such as condoms.  Please stay away from estrogen as that may affect your milk supply. Liver mass: I was reviewing this in your chart and it appears Dr. Rock Nephew was advising that you be referred to GI.  If you haven't already, sign up for My Chart to have easy access to your labs results, and communication with your primary care physician.  Call the clinic at [phone] if your symptoms worsen or you have any concerns.  You should return to our clinic Return in about 1 week (around 02/03/2022) for Nexplanon placement. Please arrive 15 minutes before your appointment to ensure smooth check in process.  We appreciate your efforts in making this happen.  Thank you for allowing me to participate in your care, Wells Guiles, DO 01/27/2022, 9:35 AM PGY-2, [place]

## 2022-01-27 NOTE — Assessment & Plan Note (Signed)
Reviewed, advise GI follow-up.

## 2022-01-27 NOTE — Assessment & Plan Note (Signed)
Desiring Nexplanon implant.  Counseled on options, no estrogen products given breast-feeding status.  Use barrier method if returning to sexual activity in the near future.  Advised making appointment for Nexplanon placement.

## 2022-01-30 ENCOUNTER — Emergency Department (HOSPITAL_COMMUNITY)
Admission: EM | Admit: 2022-01-30 | Discharge: 2022-01-30 | Disposition: A | Discharge status: Home / Self Care | Payer: Medicaid Other | Attending: Emergency Medicine | Admitting: Emergency Medicine

## 2022-01-30 ENCOUNTER — Encounter (HOSPITAL_COMMUNITY): Payer: Self-pay | Admitting: *Deleted

## 2022-01-30 ENCOUNTER — Other Ambulatory Visit: Payer: Self-pay

## 2022-01-30 DIAGNOSIS — Z20822 Contact with and (suspected) exposure to covid-19: Secondary | ICD-10-CM | POA: Diagnosis not present

## 2022-01-30 DIAGNOSIS — Z91010 Allergy to peanuts: Secondary | ICD-10-CM | POA: Insufficient documentation

## 2022-01-30 DIAGNOSIS — J029 Acute pharyngitis, unspecified: Secondary | ICD-10-CM | POA: Insufficient documentation

## 2022-01-30 LAB — RESP PANEL BY RT-PCR (FLU A&B, COVID) ARPGX2
Influenza A by PCR: NEGATIVE
Influenza B by PCR: NEGATIVE
SARS Coronavirus 2 by RT PCR: NEGATIVE

## 2022-01-30 LAB — GROUP A STREP BY PCR: Group A Strep by PCR: NOT DETECTED

## 2022-01-30 MED ORDER — LIDOCAINE VISCOUS HCL 2 % MT SOLN: 15.0000 mL | OROMUCOSAL | 0 refills | Status: DC | PRN

## 2022-01-30 MED ORDER — FLUTICASONE PROPIONATE 50 MCG/ACT NA SUSP: 1.0000 | Freq: Every day | NASAL | 0 refills | Status: DC

## 2022-01-30 NOTE — ED Triage Notes (Signed)
Sore throat x 2 days, denies fevers.

## 2022-01-30 NOTE — Discharge Instructions (Addendum)
You were seen in the ER today for a sore throat Your strep, covid & flu test were negative We suspect your symptoms are likely from a virus or allergies We are sending you home with the following medicines to help with your symptoms:  - Flonase: use 1 spray per nostril daily as needed for congestion/runny nose - Viscous lidocaine- use every 4-6 hours as needed for sore throat..    You make take Tylenol per over the counter dosing with these medications.   We have prescribed you new medication(s) today. Discuss the medications prescribed today with your pharmacist as they can have adverse effects and interactions with your other medicines including over the counter and prescribed medications. Seek medical evaluation if you start to experience new or abnormal symptoms after taking one of these medicines, seek care immediately if you start to experience difficulty breathing, feeling of your throat closing, facial swelling, or rash as these could be indications of a more serious allergic reaction    Please follow up with primary care in 1 week. Return to the ER for new or worsening symptoms or any other concerns.

## 2022-01-30 NOTE — ED Provider Notes (Signed)
[facility] EMER[facility]ENC[facility] DEPARTMENT Provider Note   CSN: [id number] Arrival date & time: 01/30/22  0004     History  Chief Complaint  Patient presents with   Sore Throat    April [name] is a 31 y.o. female who presents to the ED with complaints of sore throat x 2 days. Constant, no alleviating/aggravating factors. Associated nasal congestion/rhinorrhea. Denies fever, ear pain, cough, dyspnea, nausea or vomiting. No sick contacts w/ similar.   HPI     Home Medications Prior to Admission medications   Medication Sig Start Date End Date Taking? Authorizing Provider  acetaminophen (TYLENOL) 325 MG tablet Take 2 tablets (650 mg total) by mouth every 4 (four) hours as needed (for pain scale < 4). 01/08/22   Autry-Lott, Naaman Plummer, DO  ibuprofen (ADVIL) 600 MG tablet Take 1 tablet (600 mg total) by mouth every 6 (six) hours. 01/08/22   Autry-Lott, Naaman Plummer, DO  prenatal vitamin w/FE, FA (NATACHEW) 29-1 MG CHEW chewable tablet Chew 1 tablet by mouth daily at 12 noon.    [provider]      Allergies    Eggs or egg-derived products, Peanut-containing drug products, and Fish allergy    Review of Systems   Review of Systems  Constitutional:  Negative for chills and fever.  HENT:  Positive for congestion, rhinorrhea and sore throat. Negative for ear pain.   Respiratory:  Negative for cough and shortness of breath.   Cardiovascular:  Negative for chest pain.  Gastrointestinal:  Negative for abdominal pain, nausea and vomiting.  All other systems reviewed and are negative.   Physical Exam Updated Vital Signs BP 128/75   Pulse 92   Temp 98.1 F (36.7 C) (Oral)   Resp 16   LMP 04/11/2021   SpO2 97%  Physical Exam Vitals and nursing note reviewed.  Constitutional:      General: She is not in acute distress.    Appearance: She is well-developed.  HENT:     Head: Normocephalic and atraumatic.     Right Ear: Ear canal normal. Tympanic membrane is not  perforated, erythematous, retracted or bulging.     Left Ear: Ear canal normal. Tympanic membrane is not perforated, erythematous, retracted or bulging.     Ears:     Comments: No mastoid erythema/swelling/tenderness.     Nose:     Right Sinus: No maxillary sinus tenderness or frontal sinus tenderness.     Left Sinus: No maxillary sinus tenderness or frontal sinus tenderness.     Mouth/Throat:     Pharynx: Uvula midline. Posterior oropharyngeal erythema present. No oropharyngeal exudate.     Comments: Posterior oropharynx is symmetric appearing. Patient tolerating own secretions without difficulty. No trismus. No drooling. No hot potato voice. No swelling beneath the tongue, submandibular compartment is soft.  Eyes:     General:        Right eye: No discharge.        Left eye: No discharge.     Conjunctiva/sclera: Conjunctivae normal.     Pupils: Pupils are equal, round, and reactive to light.  Cardiovascular:     Rate and Rhythm: Normal rate and regular rhythm.     Heart sounds: No murmur heard. Pulmonary:     Effort: Pulmonary effort is normal. No respiratory distress.     Breath sounds: Normal breath sounds. No wheezing, rhonchi or rales.  Abdominal:     General: There is no distension.     Palpations: Abdomen is soft.  Tenderness: There is no abdominal tenderness.  Musculoskeletal:     Cervical back: Normal range of motion and neck supple. No edema or rigidity.  Lymphadenopathy:     Cervical: No cervical adenopathy.  Skin:    General: Skin is warm and dry.     Findings: No rash.  Neurological:     Mental Status: She is alert.  Psychiatric:        Behavior: Behavior normal.     ED Results / Procedures / Treatments   Labs (all labs ordered are listed, but only abnormal results are displayed) Labs Reviewed  GROUP A STREP BY PCR  RESP PANEL BY RT-PCR (FLU A&B, COVID) [id number]    EKG None  Radiology No results found.  Procedures Procedures    Medications  Ordered in ED Medications - No data to display  ED Course/ Medical Decision Making/ A&P                           Medical Decision Making Risk Prescription drug management.   Patient presents to the ED with complaints of sore throat Patient is nontoxic, in no acute distress, vitals WNL  Additional history obtained:  Additional history obtained from chart review & nursing note review.   Lab Tests:  I Ordered, reviewed, and interpreted labs, which included:  Strep, covid, flu: Negative  ED Course:  Exam is without signs of AOM, AOE, or mastoiditis. Oropharyngeal exam w/ erythema, strep negative, exam not consistent w/ RPA/PTA. No sinus tenderness. No meningeal signs. Lungs are CTA without focal adventitious sounds, no signs of increased work of breathing, doubt CAP. Likely viral vs. Allergic. She is breast feeding currently- will provide supportive care. PCP follow up. I discussed results, treatment plan, need for follow-up, and return precautions with the patient. Provided opportunity for questions, patient confirmed understanding and is in agreement with plan.    Portions of this note were generated with [employer]. Dictation errors may occur despite best attempts at proofreading.   Final Clinical Impression(s) / ED Diagnoses Final diagnoses:  Sore throat    Rx / DC Orders ED Discharge Orders          Ordered    fluticasone (FLONASE) 50 MCG/ACT nasal spray  Daily        01/30/22 0400    lidocaine (XYLOCAINE) 2 % solution  Every 4 hours PRN        01/30/22 0400              Opal Dinning, Caban R, PA-C 01/30/22 0550    Fatima Blank, MD 01/30/22 [phone]

## 2022-02-10 ENCOUNTER — Encounter: Payer: Self-pay | Admitting: Family Medicine

## 2022-02-10 ENCOUNTER — Encounter: Payer: Self-pay | Admitting: Student

## 2022-02-10 ENCOUNTER — Ambulatory Visit (INDEPENDENT_AMBULATORY_CARE_PROVIDER_SITE_OTHER): Payer: Medicaid Other | Admitting: Student

## 2022-02-10 ENCOUNTER — Encounter: Payer: Self-pay | Admitting: Nurse Practitioner

## 2022-02-10 VITALS — BP 118/60 | HR 95 | Wt 169.0 lb

## 2022-02-10 DIAGNOSIS — Z01818 Encounter for other preprocedural examination: Secondary | ICD-10-CM | POA: Diagnosis not present

## 2022-02-10 DIAGNOSIS — Z30017 Encounter for initial prescription of implantable subdermal contraceptive: Secondary | ICD-10-CM | POA: Diagnosis not present

## 2022-02-10 NOTE — Patient Instructions (Addendum)
It was great to see you today! Thank you for choosing [facility] for your primary care. April [name] was seen for birth control discussion.  Today we addressed: Nexplanon: We will have you come back for this on Tuesday afternoon to have it placed and do a urine pregnancy at that time. Liver mass: Please call [facility] Address: [address], [place], [place] [address] Phone: [phone].  You may not be able to move forward with the tubal ligation until this is attended to.  If you haven't already, sign up for My Chart to have easy access to your labs results, and communication with your primary care physician.  Call the clinic at [phone] if your symptoms worsen or you have any concerns.  You should return to our clinic Return if symptoms worsen or fail to improve. Please arrive 15 minutes before your appointment to ensure smooth check in process.  We appreciate your efforts in making this happen.  Thank you for allowing me to participate in your care, Wells Guiles, DO 02/10/2022, 10:20 AM PGY-2, [place]

## 2022-02-10 NOTE — Progress Notes (Signed)
  SUBJECTIVE:   CHIEF COMPLAINT / HPI:   Contraceptive management: Desiring a tubal ligation.  She was intending on getting this done during her labor but because of her liver mass, it did not happen.  Requesting Nexplanon in the meantime.  PERTINENT  PMH / PSH: Liver mass  OBJECTIVE:  BP 118/60   Pulse 95   Wt 169 lb (76.7 kg)   LMP 04/11/2021 (Approximate)   SpO2 96%   Breastfeeding No   BMI 30.91 kg/m  Physical Exam Constitutional:      General: She is not in acute distress.    Appearance: Normal appearance.  Neurological:     Mental Status: She is alert.    ASSESSMENT/PLAN:  Encounter for initial prescription of implantable subdermal contraceptive Assessment & Plan: Discussed tubal ligation procedure and thoroughly counseled on how definitive this procedure is.  Wanting to proceed with this but requesting contraceptive management in the meantime in the form of Nexplanon.  Referral to OB/GYN placed for consideration.  Unable to urinate today to confirm negative pregnancy.  Still interested in Nexplanon placement.  Scheduled for procedure on 11/7, advised to continue abstinence until then and come with a full bladder.   Tubal ligation evaluation -     Ambulatory referral to Obstetrics / Gynecology  Return if symptoms worsen or fail to improve. Wells Guiles, DO 02/10/2022, 10:32 AM PGY-2, [place]

## 2022-02-10 NOTE — Assessment & Plan Note (Addendum)
Discussed tubal ligation procedure and thoroughly counseled on how definitive this procedure is.  Wanting to proceed with this but requesting contraceptive management in the meantime in the form of Nexplanon.  Referral to OB/GYN placed for consideration.  Unable to urinate today to confirm negative pregnancy.  Still interested in Nexplanon placement.  Scheduled for procedure on 11/7, advised to continue abstinence until then and come with a full bladder.

## 2022-02-14 ENCOUNTER — Encounter: Payer: Self-pay | Admitting: Student

## 2022-02-14 ENCOUNTER — Ambulatory Visit (INDEPENDENT_AMBULATORY_CARE_PROVIDER_SITE_OTHER): Payer: Medicaid Other | Admitting: Student

## 2022-02-14 VITALS — BP 124/70 | HR 89 | Ht 61.0 in | Wt 169.1 lb

## 2022-02-14 DIAGNOSIS — Z3009 Encounter for other general counseling and advice on contraception: Secondary | ICD-10-CM

## 2022-02-14 DIAGNOSIS — Z30017 Encounter for initial prescription of implantable subdermal contraceptive: Secondary | ICD-10-CM

## 2022-02-14 DIAGNOSIS — Z3046 Encounter for surveillance of implantable subdermal contraceptive: Secondary | ICD-10-CM | POA: Diagnosis not present

## 2022-02-14 DIAGNOSIS — Z975 Presence of (intrauterine) contraceptive device: Secondary | ICD-10-CM | POA: Diagnosis not present

## 2022-02-14 LAB — POCT URINE PREGNANCY: Preg Test, Ur: NEGATIVE

## 2022-02-14 MED ORDER — ETONOGESTREL 68 MG ~~LOC~~ IMPL
68.0000 mg | DRUG_IMPLANT | Freq: Once | SUBCUTANEOUS | Status: AC
  Administered 2022-02-14: 68 mg via SUBCUTANEOUS

## 2022-02-14 NOTE — Progress Notes (Signed)
GYNECOLOGY PROCEDURE NOTE  April [name] is a 31 y.o. [id number] here for Nexplanon insertion. No other gynecologic concerns.   Nexplanon Insertion Patient identified, informed consent performed, consent signed.   Patient does understand that irregular bleeding is a very common side effect of this medication. She was advised to have backup contraception for one week after placement. Pregnancy test in clinic today was negative.  Appropriate time out taken.  Patient's left arm was prepped and draped in the usual sterile fashion. The insertion area was measured and marked.  Patient was prepped with alcohol swab and then injected with 2 ml of 1% lidocaine.  The area was then prepped with betadine, Nexplanon removed from packaging,  device confirmed present within needle, then inserted full length of needle and withdrawn per handbook instructions. Nexplanon was able to palpated in the patient's arm; patient opted not to palpate the insert herself. There was minimal blood loss.  Patient insertion site covered with guaze and a pressure bandage to reduce any bruising.  The patient tolerated the procedure well and was given post procedure instructions.   April Guiles, DO 02/14/2022, 8:42 AM PGY-2, [place]

## 2022-02-14 NOTE — Patient Instructions (Addendum)
It was great to see you today! Thank you for choosing [facility] for your primary care. April [name] was seen for Nexplanon insertion.  Today we addressed: Please refrain from sexual intercourse for 1 week while the hormones take time to work.  The Nexplanon last 3 years.   If you haven't already, sign up for My Chart to have easy access to your labs results, and communication with your primary care physician.  Call the clinic at [phone] if your symptoms worsen or you have any concerns.  You should return to our clinic Return if symptoms worsen or fail to improve. Please arrive 15 minutes before your appointment to ensure smooth check in process.  We appreciate your efforts in making this happen.  Thank you for allowing me to participate in your care, Wells Guiles, DO 02/14/2022, 3:17 PM PGY-2, [place]

## 2022-02-14 NOTE — Addendum Note (Signed)
Addended by: Talbot Grumbling on: 02/14/2022 04:49 PM   Modules accepted: Orders

## 2022-03-20 ENCOUNTER — Encounter: Payer: Self-pay | Admitting: Nurse Practitioner

## 2022-03-20 ENCOUNTER — Ambulatory Visit (INDEPENDENT_AMBULATORY_CARE_PROVIDER_SITE_OTHER): Payer: Medicaid Other | Admitting: Nurse Practitioner

## 2022-03-20 ENCOUNTER — Other Ambulatory Visit (INDEPENDENT_AMBULATORY_CARE_PROVIDER_SITE_OTHER): Payer: Medicaid Other

## 2022-03-20 VITALS — BP 118/68 | HR 79 | Ht 61.0 in | Wt 175.8 lb

## 2022-03-20 DIAGNOSIS — R160 Hepatomegaly, not elsewhere classified: Secondary | ICD-10-CM

## 2022-03-20 LAB — COMPREHENSIVE METABOLIC PANEL
ALT: 59 U/L — ABNORMAL HIGH (ref 0–35)
AST: 36 U/L (ref 0–37)
Albumin: 4.3 g/dL (ref 3.5–5.2)
Alkaline Phosphatase: 132 U/L — ABNORMAL HIGH (ref 39–117)
BUN: 11 mg/dL (ref 6–23)
CO2: 27 mEq/L (ref 19–32)
Calcium: 9.6 mg/dL (ref 8.4–10.5)
Chloride: 105 mEq/L (ref 96–112)
Creatinine, Ser: 0.7 mg/dL (ref 0.40–1.20)
GFR: 117.22 mL/min (ref >60.00)
Glucose, Bld: 94 mg/dL (ref 70–99)
Potassium: 4.2 mEq/L (ref 3.5–5.1)
Sodium: 139 mEq/L (ref 135–145)
Total Bilirubin: 0.3 mg/dL (ref 0.2–1.2)
Total Protein: 7.6 g/dL (ref 6.0–8.3)
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
## 2022-03-20 NOTE — Patient Instructions (Signed)
You have been scheduled for an MRI at 03/28/22 on 8:00 am. Please arrive to admitting (at main entrance of the hospital) 30 minutes prior to your appointment time for registration purposes. Please make certain not to have anything to eat or drink 6 hours prior to your test. In addition, if you have any metal in your body, have a pacemaker or defibrillator, please be sure to let your ordering physician know. This test typically takes 45 minutes to 1 hour to complete. Should you need to reschedule, please call [phone] to do so.   Your provider has requested that you go to the basement level for lab work before leaving today. Press "B" on the elevator. The lab is located at the first door on the left as you exit the elevator.   Due to recent changes in healthcare laws, you may see the results of your imaging and laboratory studies on MyChart before your provider has had a chance to review them.  We understand that in some cases there may be results that are confusing or concerning to you. Not all laboratory results come back in the same time frame and the provider may be waiting for multiple results in order to interpret others.  Please give [place] 48 hours in order for your provider to thoroughly review all the results before contacting the office for clarification of your results.    Thank you for trusting me with your gastrointestinal care!   Alcide Evener, CRNP

## 2022-03-21 LAB — AFP TUMOR MARKER: AFP-Tumor Marker: 1.5 ng/mL

## 2022-03-22 ENCOUNTER — Other Ambulatory Visit: Payer: Self-pay

## 2022-03-22 DIAGNOSIS — R160 Hepatomegaly, not elsewhere classified: Secondary | ICD-10-CM

## 2022-03-22 DIAGNOSIS — R7989 Other specified abnormal findings of blood chemistry: Secondary | ICD-10-CM

## 2022-03-28 ENCOUNTER — Ambulatory Visit (HOSPITAL_COMMUNITY)
Admission: RE | Admit: 2022-03-28 | Discharge: 2022-03-28 | Disposition: A | Discharge status: Home / Self Care | Payer: Medicaid Other | Source: Ambulatory Visit | Attending: Nurse Practitioner | Admitting: Nurse Practitioner

## 2022-03-28 ENCOUNTER — Other Ambulatory Visit: Payer: Self-pay | Admitting: Nurse Practitioner

## 2022-03-28 DIAGNOSIS — R160 Hepatomegaly, not elsewhere classified: Secondary | ICD-10-CM | POA: Insufficient documentation

## 2022-03-28 DIAGNOSIS — R935 Abnormal findings on diagnostic imaging of other abdominal regions, including retroperitoneum: Secondary | ICD-10-CM | POA: Diagnosis not present

## 2022-03-28 DIAGNOSIS — R932 Abnormal findings on diagnostic imaging of liver and biliary tract: Secondary | ICD-10-CM | POA: Diagnosis not present

## 2022-03-28 DIAGNOSIS — K769 Liver disease, unspecified: Secondary | ICD-10-CM | POA: Diagnosis not present

## 2022-03-28 DIAGNOSIS — K8020 Calculus of gallbladder without cholecystitis without obstruction: Secondary | ICD-10-CM | POA: Diagnosis not present

## 2022-03-28 MED ORDER — GADOXETATE DISODIUM 0.25 MMOL/ML IV SOLN
8.0000 mL | Freq: Once | INTRAVENOUS | Status: AC | PRN
  Administered 2022-03-28: 8 mL via INTRAVENOUS

## 2022-04-06 NOTE — Progress Notes (Signed)
Reviewed and agree with management plans. 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 Given the size, would consider referral to hepatobiliary surgery for resection with any associated symptoms such as abdominal pain.   Diasha Castleman L. Orvan Falconer, MD, MPH

## 2022-04-12 ENCOUNTER — Telehealth: Payer: Self-pay | Admitting: Nurse Practitioner

## 2022-04-12 NOTE — Telephone Encounter (Signed)
Patient called, she stated she would like to proceed with referral to hepatobiliary surgery for resection.

## 2022-04-12 NOTE — Telephone Encounter (Signed)
Pt stated that she wanted to proceed with the referral to hepatobiliary surgery for resection. Chart reviewed Referral sent to [facility]. Pt made aware. Pt notified that if she has not heard anything from there office in 2 weeks to give [place] a call back so we can check on the status of the referral: Pt verbalized understanding with all questions answered.

## 2022-04-19 ENCOUNTER — Other Ambulatory Visit: Payer: Self-pay

## 2022-04-19 NOTE — Progress Notes (Signed)
The proposed treatment discussed in conference is for discussion purpose only and is not a binding recommendation.  The patients have not been physically examined, or presented with their treatment options.  Therefore, final treatment plans cannot be decided.  

## 2022-04-21 ENCOUNTER — Ambulatory Visit (INDEPENDENT_AMBULATORY_CARE_PROVIDER_SITE_OTHER): Payer: Medicaid Other | Admitting: Obstetrics and Gynecology

## 2022-04-21 ENCOUNTER — Encounter: Payer: Self-pay | Admitting: Obstetrics and Gynecology

## 2022-04-21 VITALS — BP 113/75 | HR 80 | Ht 61.0 in | Wt 181.4 lb

## 2022-04-21 DIAGNOSIS — Z3009 Encounter for other general counseling and advice on contraception: Secondary | ICD-10-CM | POA: Diagnosis not present

## 2022-04-21 NOTE — Progress Notes (Signed)
32 yo P2 here for sterilization consultation. Patient is currently using Nexplanon for contraception placed in November 2023. Patient desires permanent sterilization and is not interested in hormonal contraception. Patient is without complaints. She is currently being evaluated for a large liver mass  Past Medical History:  Diagnosis Date   Liver mass    9cm mass MRI after delivery   Medical history non-contributory    Past Surgical History:  Procedure Laterality Date   NO PAST SURGERIES     WISDOM TOOTH EXTRACTION  2013   Family History  Problem Relation Age of Onset   Diabetes Maternal Aunt    Diabetes Maternal Uncle    Hypertension Maternal Uncle    Diabetes Maternal Grandmother    Hypertension Maternal Grandmother    Cancer Maternal Grandfather    Hearing loss Neg Hx    Stroke Neg Hx    Asthma Neg Hx    Social History   Tobacco Use   Smoking status: Never   Smokeless tobacco: Never  Vaping Use   Vaping Use: Never used  Substance Use Topics   Alcohol use: No   Drug use: No   ROS See pertinent in HPI. All other systems reviewed and non contributory Blood pressure 113/75, pulse 80, height 5\' 1"  (1.549 m), weight 181 lb 6.4 oz (82.3 kg), not currently breastfeeding. GENERAL: Well-developed, well-nourished female in no acute distress.  NEURO: alert and oriented x 3  A/P 32 yo here for sterilization consultation Patient with undesired fertility, desires permanent sterilization. Other reversible forms of contraception were discussed with patient; she declines all other modalities.  Risks of procedure discussed with patient including permanence of method, risk of regret, bleeding, infection, injury to surrounding organs and need for additional procedures including laparotomy.  Failure risk less than 0.5% with increased risk of ectopic gestation if pregnancy occurs was also discussed with patient.  Written informed consent was obtained. Medicaid form signed today Patient will  be scheduled for bilateral salpingectomy pending clearance from GI regarding liver mass

## 2022-04-21 NOTE — Progress Notes (Signed)
New patient presents for annual exam. Wants to discuss tubal ligation. Has nexplanon, placed 02/14/2022. Last pap 03/11/2019. Declines today.  Declines STD testing.

## 2022-05-09 DIAGNOSIS — K7689 Other specified diseases of liver: Secondary | ICD-10-CM | POA: Diagnosis not present

## 2022-05-22 ENCOUNTER — Telehealth: Payer: Self-pay | Admitting: *Deleted

## 2022-05-22 ENCOUNTER — Other Ambulatory Visit: Payer: Self-pay | Admitting: *Deleted

## 2022-05-22 NOTE — Telephone Encounter (Signed)
TC from pt requesting cancellation of BTL for 06/08/22. Pt states she has been diagnosed with a liver mass and will need to postpone the surgery. Message routed to provider and surgery scheduler. Emotional support offered. Pt will follow up with [place] as needed.

## 2022-06-08 ENCOUNTER — Ambulatory Visit: Admit: 2022-06-08 | Payer: Medicaid Other | Admitting: Obstetrics and Gynecology

## 2022-06-08 SURGERY — SALPINGECTOMY, BILATERAL, LAPAROSCOPIC
Anesthesia: Choice

## 2022-06-15 IMAGING — US US MFM OB DETAIL+14 WK
1 series · 13 of 28 positions shown · non-contrast
Comparison: none

[Series 1: us mfm ob detail+14 wk · 13 of 163 slices shown]
[im 7/163]
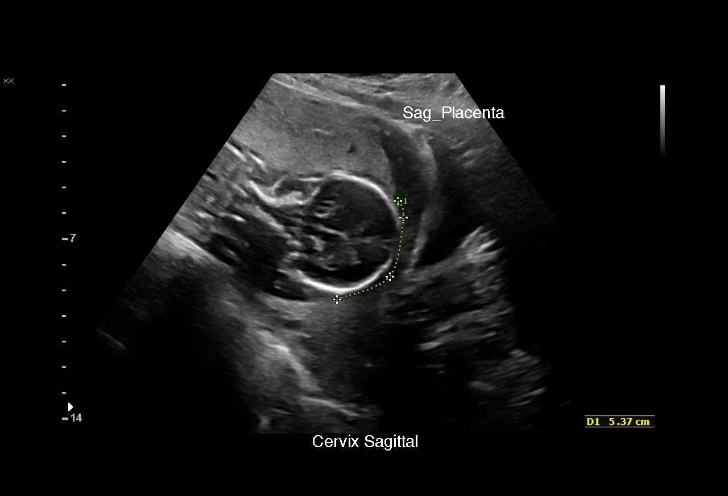
[im 19/163]
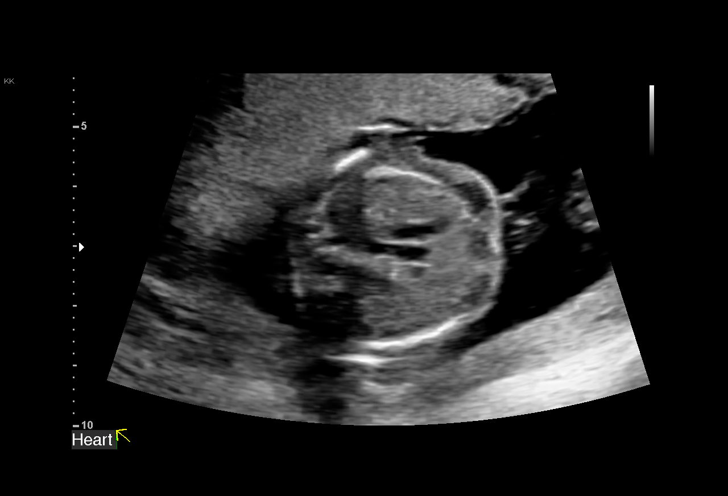
[im 31/163]
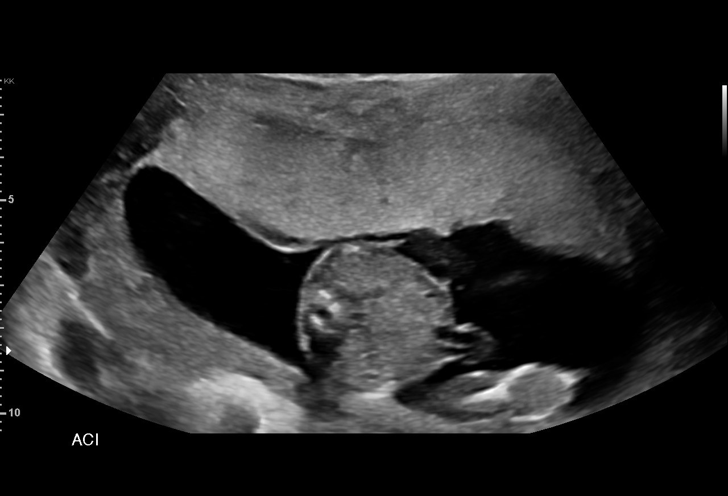
[im 43/163]
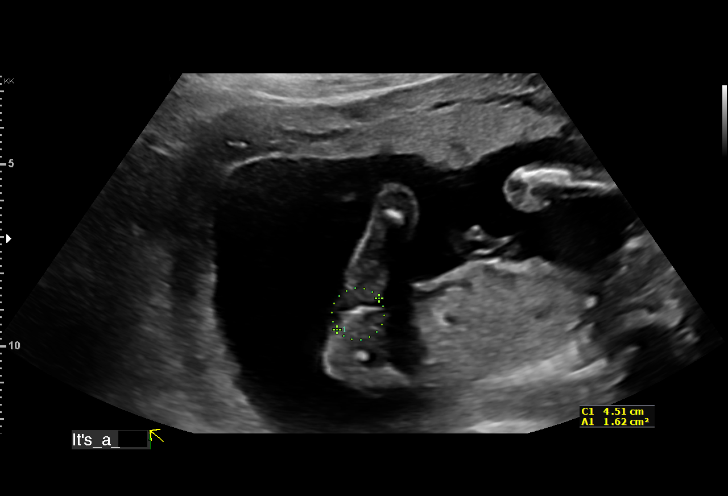
[im 55/163]
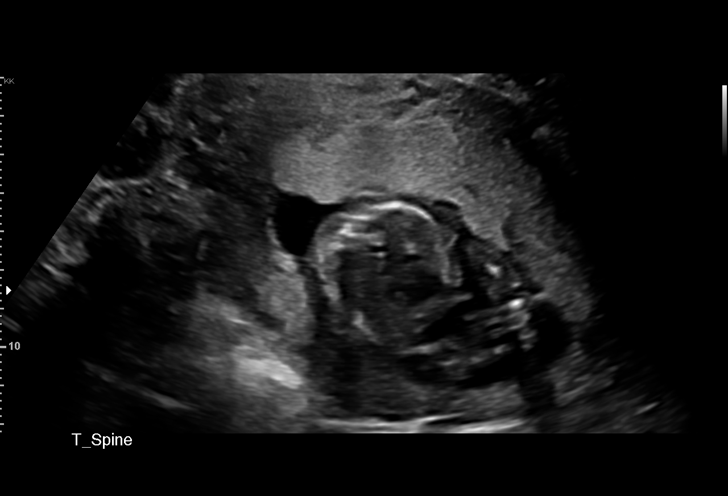
[im 67/163]
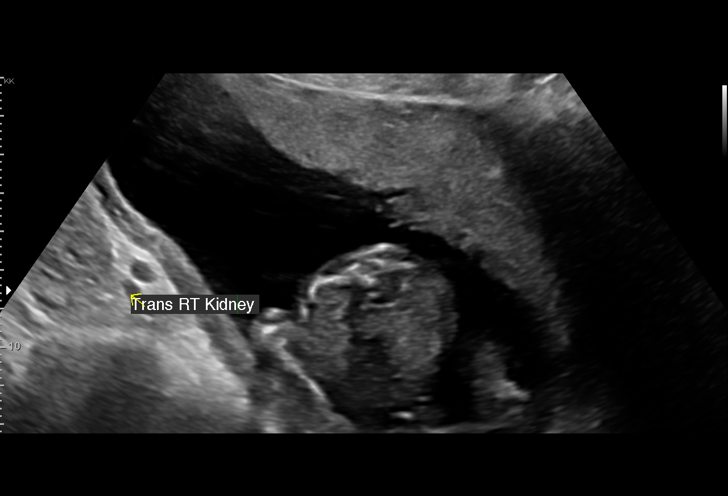
[im 85/163]
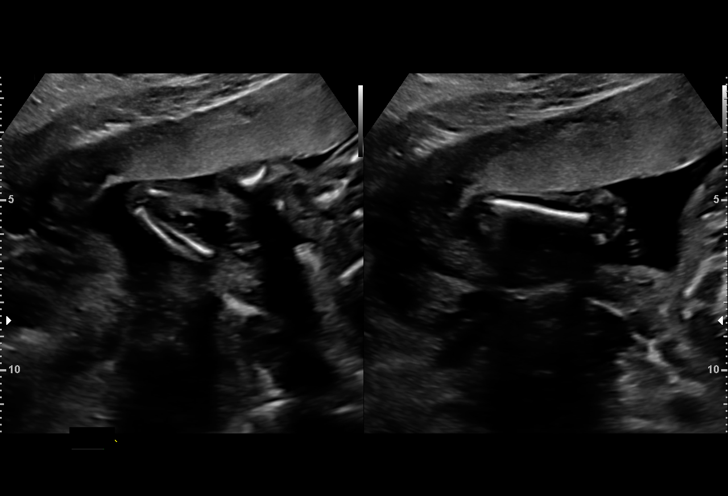
[im 97/163]
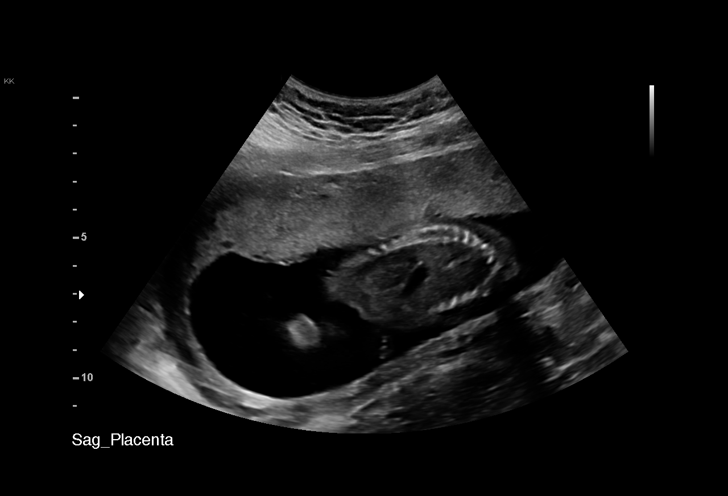
[im 109/163]
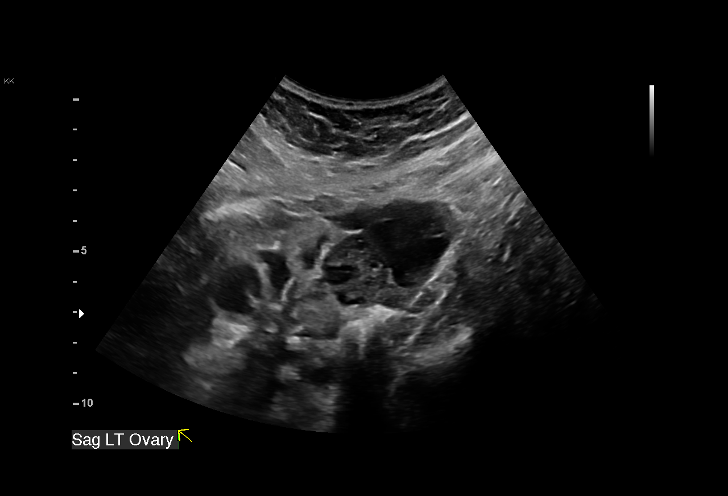
[im 121/163]
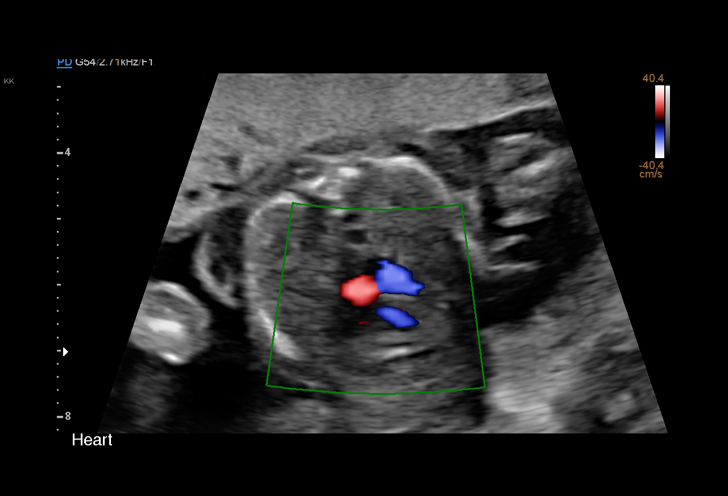
[im 133/163]
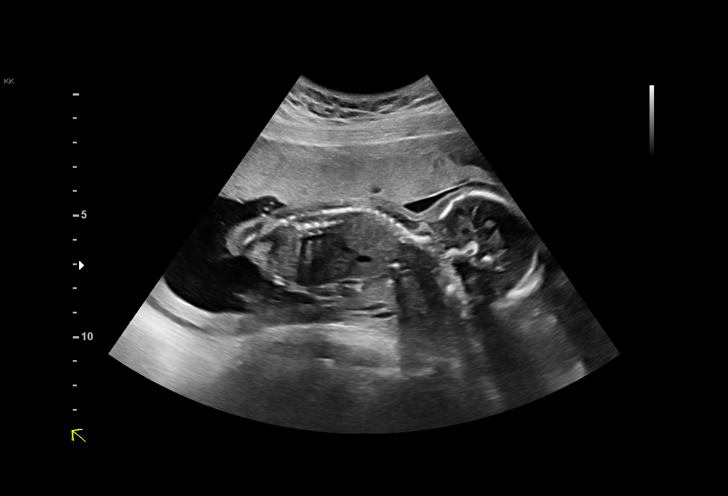
[im 145/163]
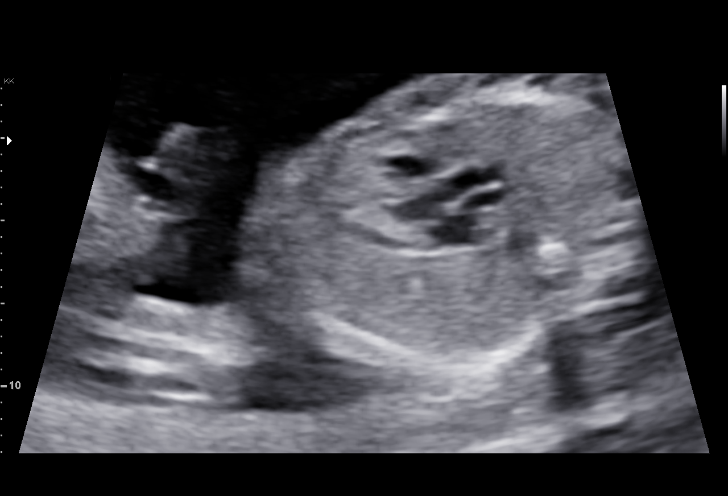
[im 157/163]
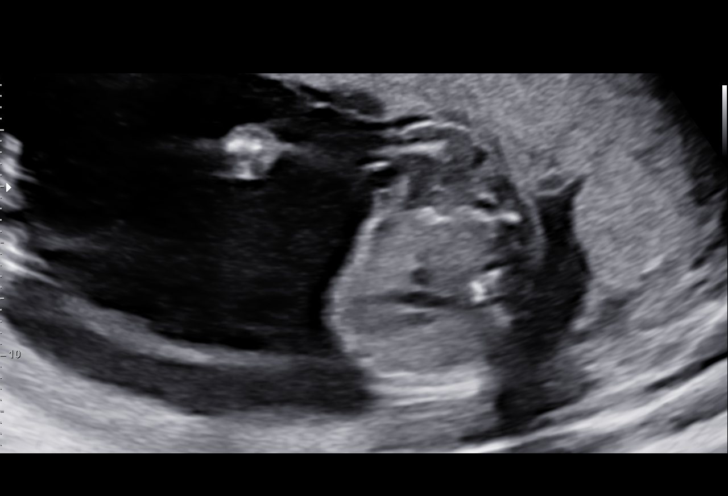

[13 of 28 positions shown; findings below may reference images not displayed]

Indications

 Obesity complicating pregnancy, second
 trimester (BMI 35)
 19 weeks gestation of pregnancy
 Low Risk NIPS
 Encounter for antenatal screening for
 malformations
Fetal Evaluation

 Num Of Fetuses:         1
 Fetal Heart Rate(bpm):  152
 Cardiac Activity:       Observed
 Presentation:           Cephalic
 Placenta:               Anterior
 P. Cord Insertion:      Visualized

 Amniotic Fluid
 AFI FV:      Within normal limits

                             Largest Pocket(cm)

Biometry

 BPD:      44.5  mm     G. Age:  19w 3d         64  %    CI:           77   %    70 - 86
                                                         FL/HC:      19.6   %    16.1 -
 HC:      160.6  mm     G. Age:  18w 6d         30  %    HC/AC:      1.20        1.09 -
 AC:      133.3  mm     G. Age:  18w 6d         35  %    FL/BPD:     70.8   %
 FL:       31.5  mm     G. Age:  19w 6d         67  %    FL/AC:      23.6   %    20 - 24
 HUM:      29.3  mm     G. Age:  19w 4d         61  %
 CER:        20  mm     G. Age:  19w 2d         50  %
 NFT:       3.1  mm

 CM:        5.8  mm

 Est. FW:     282  gm    0 lb 10 oz      52  %
OB History

 Gravidity:    2         Term:   1        Prem:   0        SAB:   0
 TOP:          0       Ectopic:  0        Living: 1
Gestational Age

 LMP:           19w 1d        Date:  04/11/21                 EDD:   01/16/22
 U/S Today:     19w 2d                                        EDD:   01/15/22
 Best:          19w 1d     Det. By:  LMP  (04/11/21)          EDD:   01/16/22
Anatomy

 Cranium:               Appears normal         LVOT:                   Not well visualized
 Cavum:                 Not well visualized    Aortic Arch:            Appears normal
 Ventricles:            Not well visualized    Ductal Arch:            Appears normal
 Choroid Plexus:        Appears normal         Diaphragm:              Not well visualized
 Cerebellum:            Appears normal         Stomach:                Appears normal, left
                                                                       sided
 Posterior Fossa:       Appears normal         Abdomen:                Appears normal
 Nuchal Fold:           Appears normal         Abdominal Wall:         Appears nml (cord
                                                                       insert, abd wall)
 Face:                  Not well visualized    Cord Vessels:           Appears normal (3
                                                                       vessel cord)
 Lips:                  Not well visualized    Kidneys:                Appear normal
 Palate:                Not well visualized    Bladder:                Appears normal
 Thoracic:              Appears normal         Spine:                  Appears normal
 Heart:                 Appears normal         Upper Extremities:      Appears normal
                        (4CH, axis, and
                        situs)
 RVOT:                  Not well visualized    Lower Extremities:      Appears normal

 Other:  Heels visualized. Technically difficult due to maternal habitus and
         fetal position.
Cervix Uterus Adnexa

 Cervix
 Length:            3.7  cm.
 Normal appearance by transabdominal scan.

 Right Ovary
 Not visualized.

 Left Ovary
 Within normal limits.

 Adnexa
 No abnormality visualized.
Comments

 This patient was seen for a detailed fetal anatomy scan due
 to maternal obesity.
 She denies any significant past medical history and denies
 any problems in her current pregnancy.
 She had a cell free DNA test earlier in her pregnancy which
 indicated a low risk for trisomy 21, 18, and 13. A female fetus
 is predicted.
 She was informed that the fetal growth and amniotic fluid
 level were appropriate for her gestational age.
 There were no obvious fetal anomalies noted on today's
 ultrasound exam.  However, the views of the fetal anatomy
 were limited today due to the fetal position.
 The patient was informed that anomalies may be missed due
 to technical limitations. If the fetus is in a suboptimal position
 or maternal habitus is increased, visualization of the fetus in
 the maternal uterus may be impaired.
 A follow-up exam was scheduled in 4 weeks to complete the
 views of the fetal anatomy.

## 2023-07-26 ENCOUNTER — Ambulatory Visit: Admitting: Family Medicine

## 2023-07-30 ENCOUNTER — Encounter: Payer: Self-pay | Admitting: Student

## 2023-07-30 ENCOUNTER — Other Ambulatory Visit (HOSPITAL_COMMUNITY)
Admission: RE | Admit: 2023-07-30 | Discharge: 2023-07-30 | Disposition: A | Discharge status: Home / Self Care | Source: Ambulatory Visit | Attending: Family Medicine | Admitting: Family Medicine

## 2023-07-30 ENCOUNTER — Ambulatory Visit (INDEPENDENT_AMBULATORY_CARE_PROVIDER_SITE_OTHER): Admitting: Student

## 2023-07-30 VITALS — BP 120/64 | HR 113 | Ht 61.0 in | Wt 197.1 lb

## 2023-07-30 DIAGNOSIS — Z113 Encounter for screening for infections with a predominantly sexual mode of transmission: Secondary | ICD-10-CM | POA: Diagnosis not present

## 2023-07-30 DIAGNOSIS — M549 Dorsalgia, unspecified: Secondary | ICD-10-CM | POA: Diagnosis not present

## 2023-07-30 DIAGNOSIS — Z124 Encounter for screening for malignant neoplasm of cervix: Secondary | ICD-10-CM | POA: Diagnosis not present

## 2023-07-30 DIAGNOSIS — B9689 Other specified bacterial agents as the cause of diseases classified elsewhere: Secondary | ICD-10-CM

## 2023-07-30 LAB — POCT WET PREP (WET MOUNT)
Clue Cells Wet Prep Whiff POC: POSITIVE
Trichomonas Wet Prep HPF POC: ABSENT
WBC, Wet Prep HPF POC: >20

## 2023-07-30 NOTE — Patient Instructions (Addendum)
 It was great to see you! Thank you for allowing me to participate in your care!   Our plans for today:  - I will let you know what your swabs and pap smear show when results come back - I have referred you to plastic surgery to evaluate for breast reduction  Take care and seek immediate care sooner if you develop any concerns.  Genora Kidd, MD

## 2023-07-30 NOTE — Progress Notes (Signed)
    SUBJECTIVE:   CHIEF COMPLAINT / HPI: Obtain pap smear  Pap Smear: Patient is a 33 y.o. female presenting for pap smear.  She states she does not have any discharge, odor or vaginal symptoms.  She is interested in screening for sexually transmitted infections today (on swabs only). She is on contraception with nexplanon  placed 02/2022  Discussed the use of AI scribe software for clinical note transcription with the patient, who gave verbal consent to proceed.  History of Present Illness The patient presents for a routine gynecological exam and to discuss a possible breast reduction. She denies any vaginal discomfort, itching, or change in odor. She is currently using a contraceptive implant in her arm. She has been experiencing upper back pain and discomfort when lying flat due to her large breasts. She is interested in a breast reduction to alleviate these symptoms.   PERTINENT  PMH / PSH: recurrent candidiasis  OBJECTIVE:   BP 120/64   Pulse (!) 113   Ht 5\' 1"  (1.549 m)   Wt 197 lb 2 oz (89.4 kg)   LMP 07/10/2023   SpO2 97%   BMI 37.25 kg/m    General: NAD, pleasant, able to participate in exam Respiratory: Normal effort, no obvious respiratory distress, macromastia  Pelvic: VULVA: normal appearing vulva with no masses, tenderness or lesions, VAGINA: Normal appearing vagina with normal color, no lesions, with white discharge present, CERVIX: No lesions, white discharge present. Pap smear performed with cytobrush and spatula  Chaperone Thamas Fillers CMA present for pelvic exam  ASSESSMENT/PLAN:   Assessment & Plan Screening for cervical cancer 33 y.o. female here for pap smear.  Physical exam significant for white discharge.  Patient is interested in STI screening on swab. 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 - Refer to plastic surgery for breast reduction consultation per pt preference - Recommend supportive bra    Genora Kidd, MD [facility]

## 2023-07-31 MED ORDER — METRONIDAZOLE 0.75 % VA GEL: 1.0000 | Freq: Every day | VAGINAL | 0 refills | Status: AC

## 2023-08-02 LAB — CYTOLOGY - PAP
Adequacy: ABSENT
Chlamydia: NEGATIVE
Comment: NEGATIVE
Comment: NEGATIVE
Comment: NORMAL
Diagnosis: NEGATIVE
High risk HPV: POSITIVE — AB
Neisseria Gonorrhea: NEGATIVE

## 2023-09-11 ENCOUNTER — Encounter (HOSPITAL_COMMUNITY): Payer: Self-pay

## 2023-09-11 ENCOUNTER — Ambulatory Visit (INDEPENDENT_AMBULATORY_CARE_PROVIDER_SITE_OTHER): Admitting: Clinical

## 2023-09-11 DIAGNOSIS — F331 Major depressive disorder, recurrent, moderate: Secondary | ICD-10-CM

## 2023-09-11 NOTE — Progress Notes (Signed)
 Comprehensive Clinical Assessment (CCA) Note  09/11/2023 April [name] [id number]  Chief Complaint:  Chief Complaint  Patient presents with   Anxiety   Depression   Visit Diagnosis:    MDD, recurrent episode, moderate with anxious distress  Interpretive Summary:  Client is a 33 year old female presenting to the [facility] as a new patient to establish with outpatient therapy services. Client reported she is presenting by her own referral for clinical assessment. Client reported she has a history of recurrent anxiety and depressive symptoms since 2023. Client reported in 2023 she was seen at the urgent care while pregnant. Client reported during her pregnancy and marriage was the first time she felt like "horrible" hard to describe, the most depressed she has ever been. Client reported she needs an outlet to talk things through. Client reported she has been pushing through and hasn't taken time to put it out.  Client reported her marriage was stressor and the source of her depression. Client reported he was/is a felon. Client reported she'd known him since they were 13. Client reported they dated in 2022 and 2 months into there relationship knew they wanted to get married. Client reported things moved fast from there. Client reported he had income issues after 3- 4 months. Client reported they were evicted twice, and she had to leave 2 jobs related to work stress and home life. Client reported separately living with her family and he never came home. Client reported his defense mechanism was staying away. Client reported they were days when she cried the whole day. Client reported that led to observation at [facility] urgent care in 2023 related to overwhelming depression. Client reported they separated when he went to jail in 2024 and is not yet sure if he is aware of her filing for separation. Client reported she now has her own residence with her children. Client  reported she has ample support from family and friends.  Client presented oriented times five, appropriately dressed and friendly. Client denied hallucinations, delusions, suicidal and homicidal ideations. Client was screened for [ain, nutrition, [place] suicide severity and the following SDOH:    09/11/2023    1:20 PM 04/21/2022   10:18 AM  GAD 7 : Generalized Anxiety Score  Nervous, Anxious, on Edge 3 0  Control/stop worrying 3 0  Worry too much - different things 3 0  Trouble relaxing 3 0  Restless 3 0  Easily annoyed or irritable 3 0  Afraid - awful might happen 3 0  Total GAD 7 Score 21 0  Anxiety Difficulty Very difficult         09/11/2023    1:20 PM 04/21/2022   10:18 AM  GAD 7 : Generalized Anxiety Score  Nervous, Anxious, on Edge 3 0  Control/stop worrying 3 0  Worry too much - different things 3 0  Trouble relaxing 3 0  Restless 3 0  Easily annoyed or irritable 3 0  Afraid - awful might happen 3 0  Total GAD 7 Score 21 0  Anxiety Difficulty Very difficult    Treatment Recommendations: individual therapy. Client declined psychiatry at this time.    CCA Biopsychosocial Intake/Chief Complaint:  Client is presenting with a history of depressive and anxiety symptoms that began in 2023.  Current Symptoms/Problems: client reported feeling overwhelmed, sad  Patient Reported Schizophrenia/Schizoaffective Diagnosis in Past: No  Strengths: voluntarily engaging in services  Preferences: counseling  Abilities: discuss history of symptoms and  Type of Services Patient Feels  are Needed: individual therapy  Initial Clinical Notes/Concerns: No data recorded  Mental Health Symptoms Depression:  Change in energy/activity; Hopelessness; Tearfulness   Duration of Depressive symptoms: Greater than two weeks   Mania:  None   Anxiety:   Difficulty concentrating; Tension; Worrying   Psychosis:  None   Duration of Psychotic symptoms: No data recorded  Trauma:  None    Obsessions:  None   Compulsions:  None   Inattention:  None   Hyperactivity/Impulsivity:  None   Oppositional/Defiant Behaviors:  None   Emotional Irregularity:  None   Other Mood/Personality Symptoms:  No data recorded   Mental Status Exam Appearance and self-care  Stature:  Average   Weight:  Average weight   Clothing:  Casual   Grooming:  Normal   Cosmetic use:  Age appropriate   Posture/gait:  Normal   Motor activity:  Not Remarkable   Sensorium  Attention:  Normal   Concentration:  Normal   Orientation:  X5   Recall/memory:  Normal   Affect and Mood  Affect:  Congruent   Mood:  Anxious; Depressed   Relating  Eye contact:  Normal   Facial expression:  Responsive   Attitude toward examiner:  Cooperative   Thought and Language  Speech flow: Clear and Coherent   Thought content:  Appropriate to Mood and Circumstances   Preoccupation:  None   Hallucinations:  None   Organization:  No data recorded  [employer] of Knowledge:  Good   Intelligence:  Average   Abstraction:  Normal   Judgement:  Good   Reality Testing:  Adequate   Insight:  Good   Decision Making:  Normal   Social Functioning  Social Maturity:  Responsible   Social Judgement:  Normal   Stress  Stressors:  Relationship   Coping Ability:  Resilient   Skill Deficits:  Activities of daily living   Supports:  Family; Friends/Service system     Religion: Religion/Spirituality Are You A Religious Person?: No  Leisure/Recreation: Leisure / Recreation Do You Have Hobbies?: No  Exercise/Diet: Exercise/Diet Do You Exercise?: No Have You Gained or Lost A Significant Amount of Weight in the Past Six Months?: No Do You Follow a Special Diet?: No Do You Have Any Trouble Sleeping?: Yes Explanation of Sleeping Difficulties: client reported she is up and down due to her baby.   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 client reported her oldest is not from her recent marriage.  Childhood History:  Childhood History Additional childhood history information: client reported she is from [facility] client reported her childhood was decent. client reported her mom did her best but had a disability so there was some limitations. client reported her mother was strict but for good reasons. Patient's description of current relationship with people who raised him/her: client reported she and her mom have a good relationship. Does patient have siblings?: Yes Number of Siblings: 6 Description of patient's current relationship with siblings: client reported she has siblings seperately from both parents.  Did patient suffer any verbal/emotional/physical/sexual abuse as a child?: No Did patient suffer from severe childhood neglect?: No Has patient ever been sexually abused/assaulted/raped as an adolescent or adult?: No Was the patient ever a victim of a crime or a disaster?: No Witnessed domestic violence?: No Has patient been affected by domestic violence as an adult?: No  Child/Adolescent Assessment:     CCA Substance Use Alcohol/Drug Use: Alcohol / Drug Use History of alcohol / drug use?: No history of alcohol / drug abuse                         ASAM's:  Six Dimensions of Multidimensional Assessment  Dimension 1:  Acute Intoxication and/or Withdrawal Potential:      Dimension 2:   Biomedical Conditions and Complications:      Dimension 3:  Emotional, Behavioral, or Cognitive Conditions and Complications:     Dimension 4:  Readiness to Change:     Dimension 5:  Relapse, Continued use, or Continued Problem Potential:     Dimension 6:  Recovery/Living Environment:     ASAM Severity Score:    ASAM Recommended Level of Treatment:     Substance use Disorder (SUD)    Recommendations for Services/Supports/Treatments: Recommendations for Services/Supports/Treatments Recommendations For Services/Supports/Treatments: Individual Therapy  DSM5 Diagnoses: Patient Active Problem List   Diagnosis Date Noted   Nexplanon  in place 02/14/2022   Contraceptive management 01/27/2022   Vaginal delivery 01/07/2022   Group B streptococcal infection in pregnancy 12/22/2021   Liver mass 08/07/2021   Supervision of normal pregnancy 05/27/2021   Anxiety and depression 01/05/2016   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    Patient Centered Plan: Patient is on the following Treatment Plan(s):  Depression   Referrals to Alternative Service(s): Referred to Alternative Service(s):   Place:   Date:   Time:    Referred to Alternative Service(s):   Place:   Date:   Time:    Referred to Alternative Service(s):   Place:   Date:   Time:    Referred to Alternative Service(s):   Place:   Date:   Time:      Collaboration of Care: Referral or follow-up with counselor/therapist [facility]-OP  Patient/Guardian was advised Release of Information must be obtained prior to any record release in order to collaborate their care with an outside provider. Patient/Guardian was advised if they have not already done so to contact the registration department to sign all necessary forms in order for us  to release information regarding their care.   Consent: Patient/Guardian gives verbal consent for treatment and assignment of benefits for services provided during this visit. Patient/Guardian expressed  understanding and agreed to proceed.   Tige Meas Y Keslyn Teater, LCSW

## 2023-09-11 NOTE — Progress Notes (Deleted)
 Client reported she is coming on her own. Reason: client reported in 2023 she was seen at the urgent care while pregnant. Client reported during her pregnancy and marriage was the first time she felt like "horrible" hard to describe, the most depressed she has ever been. Client reported she needs an outlet to talk things through. Client reported she has been pushing through and hasn't taken time to put it out.  Client reported her marriage was stressor. Client reported he was a felon. Client reported shed known him since they were 13. client reported they date din 2022 and 2 months in knew they wanted to get married. Client reported things moved fast from there. Client reported he had income issues after 3- 4 months. client reported they were evicted twice, and she had to leave 2 jobs related to work stress and home life. Client reported separately loving with her family and he never came home. Client reported his defense mechanism was staying away. Client reported they were days when she cried the whole day. Client reported that led to observation in 2023. Client reported they separated when he went to jail in 2024. Marriage: divorce not yet finalized. Client reported he is in jail now and does not know how he will react. Client reported he has been in there since December.  Live with: stay alone with her kids. Support: best friend, her mom, all her family  Sleeping: up and down due to baby  Appetite: up and down f/u: no psychiatry just therapy

## 2023-09-13 ENCOUNTER — Ambulatory Visit: Admitting: Student

## 2023-09-13 ENCOUNTER — Encounter: Payer: Self-pay | Admitting: Student

## 2023-09-13 VITALS — BP 107/40 | HR 112 | Ht 61.0 in | Wt 193.2 lb

## 2023-09-13 DIAGNOSIS — B9689 Other specified bacterial agents as the cause of diseases classified elsewhere: Secondary | ICD-10-CM

## 2023-09-13 DIAGNOSIS — N760 Acute vaginitis: Secondary | ICD-10-CM | POA: Diagnosis present

## 2023-09-13 DIAGNOSIS — T7840XD Allergy, unspecified, subsequent encounter: Secondary | ICD-10-CM | POA: Diagnosis not present

## 2023-09-13 LAB — POCT WET PREP (WET MOUNT)
Clue Cells Wet Prep Whiff POC: POSITIVE
Trichomonas Wet Prep HPF POC: ABSENT

## 2023-09-13 MED ORDER — EPINEPHRINE 0.3 MG/0.3ML IJ SOAJ: 0.3000 mg | INTRAMUSCULAR | 0 refills | Status: AC | PRN

## 2023-09-13 MED ORDER — METRONIDAZOLE 500 MG PO TABS: 500.0000 mg | ORAL_TABLET | Freq: Two times a day (BID) | ORAL | 0 refills | Status: AC

## 2023-09-13 NOTE — Progress Notes (Signed)
    SUBJECTIVE:   CHIEF COMPLAINT / HPI: Vaginal discharge and allergies  Discussed the use of AI scribe software for clinical note transcription with the patient, who gave verbal consent to proceed.  History of Present Illness April [name] is a 33 year old female who presents for follow-up regarding bacterial vaginosis.  She experienced bacterial vaginosis a few weeks ago and seeks confirmation of its resolution. She noticed a possible white discharge for about a day without any change in odor. She uses Nexplanon  for birth control, placed in November 2023.  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 She has not had an allergist and does not have epi pen. States she would like to know what she is allergic to   PERTINENT  PMH / PSH: None relevant  OBJECTIVE:   BP (!) 107/40   Pulse (!) 112   Ht 5\' 1"  (1.549 m)   Wt 193 lb 3.2 oz (87.6 kg)   SpO2 100%   BMI 36.50 kg/m    General: NAD, pleasant, able to participate in exam Respiratory: Normal effort, no obvious respiratory distress Pelvic: VULVA: normal appearing vulva with no masses, tenderness or lesions, VAGINA: Normal appearing vagina with normal color, no lesions, with white discharge present, CERVIX: No lesions, white discharge present  Chaperone April [name] CMA present for pelvic exam  ASSESSMENT/PLAN:   Assessment & Plan BV (bacterial vaginosis) 33 y.o. female with vaginal discharge for 1 days.  Physical exam significant for white discharge.  Wet prep performed today consistent with BV.  Patient is not interested in STI screening.   Plan: -Wet prep as above.  Will treat with flagyl  BID x 7 days (discussed with pt). -Discussed protection during intercourse and contraceptive methods -Follow-up as needed Allergy, subsequent encounter Many severe allergies noted. Spoke with pt on phone-plan to send in epipen in meantime while awaiting allergy evaluation. - Allergy  referral - Epipen Rx   Genora Kidd, MD [facility]

## 2023-09-13 NOTE — Patient Instructions (Signed)
 It was great to see you! Thank you for allowing me to participate in your care!   Our plans for today:  - I will let you know what your swab shows!  Take care and seek immediate care sooner if you develop any concerns.  Genora Kidd, MD

## 2023-10-16 ENCOUNTER — Ambulatory Visit: Admitting: Family Medicine

## 2023-10-16 ENCOUNTER — Encounter: Payer: Self-pay | Admitting: Family Medicine

## 2023-10-16 ENCOUNTER — Ambulatory Visit: Payer: Self-pay | Admitting: Family Medicine

## 2023-10-16 VITALS — BP 111/50 | HR 77 | Temp 98.2°F | Ht 61.0 in | Wt 190.1 lb

## 2023-10-16 DIAGNOSIS — N3289 Other specified disorders of bladder: Secondary | ICD-10-CM

## 2023-10-16 DIAGNOSIS — R350 Frequency of micturition: Secondary | ICD-10-CM | POA: Diagnosis not present

## 2023-10-16 DIAGNOSIS — N3001 Acute cystitis with hematuria: Secondary | ICD-10-CM

## 2023-10-16 LAB — POCT URINALYSIS DIP (MANUAL ENTRY)
Bilirubin, UA: NEGATIVE
Glucose, UA: NEGATIVE mg/dL
Ketones, POC UA: NEGATIVE mg/dL
Nitrite, UA: NEGATIVE
Protein Ur, POC: 100 mg/dL — AB
Spec Grav, UA: 1.025 (ref 1.010–1.025)
Urobilinogen, UA: 1 U/dL
pH, UA: 7 (ref 5.0–8.0)

## 2023-10-16 LAB — POCT UA - MICROSCOPIC ONLY: RBC, Urine, Miroscopic: >20 (ref 0–2)

## 2023-10-16 MED ORDER — CEFUROXIME AXETIL 250 MG PO TABS: 250.0000 mg | ORAL_TABLET | Freq: Two times a day (BID) | ORAL | 0 refills | Status: AC

## 2023-10-16 NOTE — Patient Instructions (Signed)
 It was wonderful to see you today!  We did a urine test today in office which showed  . I will send it to be cultured, and order antibiotics if the culture is positive. In the mean time, you can use AZO over the counter to help relieve the symptoms. You should also drink plenty of water to help continue to flush any bacteria from your bladder. If you develop a fever, or you notice blood in your urine, please go to the emergency department.   Please call [phone] with any questions about today's appointment.   If you need any additional refills, please call your pharmacy before calling the office.  Lucie Pinal, DO Family Medicine

## 2023-10-16 NOTE — Progress Notes (Signed)
    SUBJECTIVE:   CHIEF COMPLAINT / HPI:   Patient experiencing uncomfortable bladder spasm for the last 2 days every time she pees.  She denies any itching, painful urination, burning sensation or new discharge.  She has had no new sexual partners and does not have concern for yeast or STI infection at this time.  PERTINENT  PMH / PSH: Recurrent vaginal candidiasis  OBJECTIVE:   There were no vitals taken for this visit.  General: A&O, NAD Cardiac: RRR, no m/r/g Respiratory: CTAB, normal WOB, no w/c/r GI: Soft, NTTP, non-distended  Extremities: NTTP, no peripheral edema.  ASSESSMENT/PLAN:   Assessment & Plan Bladder spasm -POCT urinalysis, reflex to culture -supportive care discussed.  - List of bladder irritants provided, advised patient to avoid foods on the list as able. -Will follow-up results and prescribe medications as appropriate   Lucie Pinal, DO [facility]

## 2023-10-19 LAB — URINE CULTURE

## 2023-10-30 ENCOUNTER — Ambulatory Visit (HOSPITAL_COMMUNITY): Admitting: Clinical

## 2023-10-30 DIAGNOSIS — F331 Major depressive disorder, recurrent, moderate: Secondary | ICD-10-CM

## 2023-10-30 NOTE — Progress Notes (Signed)
   THERAPIST PROGRESS NOTE  Session Time: 40 minutes  Participation Level: Active  Behavioral Response: CasualAlertAnxious  Type of Therapy: Individual Therapy  Treatment Goals addressed: client will engage in at least 80% of scheduled individual psychotherapy sessions  ProgressTowards Goals: Progressing  Interventions: CBT and Supportive  Summary:  April [name] is a 33 y.o. female who presents for the scheduled appointment oriented times five, appropriately dressed and friendly. Client denied hallucinations and delusions. Client reported she has been doing pretty well overall but having some worry. Client reported she has been dating a new guy for about 4 months now and things are going well. Client reported her boyfriend is 4 years older than her and he has been treating her better than anyone she has been with. Client reported he talks to her a lot about the future including marriage and more kids. Client reported she has PTSD from her marriage to her daughters father. Client reported her daughters father was not abusive but he abandoned them at the house. Client reported he also dealt with substance use issues. Client reported he is now is jail waiting for trial related to charges for gun possession and alleged connection to a murder. Client reported she also feels hesitant to bring her new boyfriend around her family because her father and most members did not meet her daughters father while they were together. Client reported she has not talk to her dad about what happened with her daughters father and thinks she wants to clear the air about that with him as she figures out how to move forward. Client reported otherwise she has been doing well and her kids are doing well. Evidence of progress towards goal:  client reported 1 step of being able to identify the source of her negative thoughts and feelings related to prior issues in her relationship.  Suicidal/Homicidal: Nowithout  intent/plan  Therapist Response:  Therapist began the appointment asking the client how she has been doing. Therapist used cbt to engage with active listening and positive emotional support. Therapist used cbt to engage and ask her about changes that have occurred and its relation to her emotions and behaviors. Therapist used cbt to engage to discuss steps she wants to take to help her feel positive about the future. Therapist used CBT ask the client to identify her progress with frequency of use with coping skills with continued practice in her daily activity.    Therapist assigned her homework to practice self care.   Plan: Return again in 4 weeks.  Diagnosis: mdd, recurrent moderate with anxious distress  Collaboration of Care: Patient refused AEB none requested by the client.  Patient/Guardian was advised Release of Information must be obtained prior to any record release in order to collaborate their care with an outside provider. Patient/Guardian was advised if they have not already done so to contact the registration department to sign all necessary forms in order for us  to release information regarding their care.   Consent: Patient/Guardian gives verbal consent for treatment and assignment of benefits for services provided during this visit. Patient/Guardian expressed understanding and agreed to proceed.   Kasyn Rolph Y Taaliyah Delpriore, LCSW 10/30/2023

## 2023-11-14 ENCOUNTER — Ambulatory Visit (HOSPITAL_COMMUNITY): Admitting: Clinical

## 2023-11-19 ENCOUNTER — Ambulatory Visit: Admitting: Allergy

## 2023-11-19 NOTE — Progress Notes (Deleted)
 New Patient Note  RE: April [name] MRN: [id number] DOB: [birth year] Date of Office Visit: 11/19/2023  Consult requested by: Donah Laymon PARAS, MD Primary care provider: Cleotilde Lukes, DO  Chief Complaint: No chief complaint on file.  History of Present Illness: I had the pleasure of seeing April [name] for initial evaluation at the [facility] on 11/19/2023. She is a 33 y.o. female, who is referred here by Donah Laymon PARAS, MD for the evaluation of ***.  Discussed the use of AI scribe software for clinical note transcription with the patient, who gave verbal consent to proceed.  History of Present Illness             ***  Assessment and Plan: [name] is a 33 y.o. female with: ***  Assessment and Plan               No follow-ups on file.  No orders of the defined types were placed in this encounter.  Lab Orders  No laboratory test(s) ordered today    Other allergy screening: Asthma: {Blank single:19197::yes,no} Rhino conjunctivitis: {Blank single:19197::yes,no} Food allergy: {Blank single:19197::yes,no} Medication allergy: {Blank single:19197::yes,no} Hymenoptera allergy: {Blank single:19197::yes,no} Urticaria: {Blank single:19197::yes,no} Eczema:{Blank single:19197::yes,no} History of recurrent infections suggestive of immunodeficency: {Blank single:19197::yes,no}  Diagnostics: Spirometry:  Tracings reviewed. Her effort: {Blank single:19197::Good reproducible efforts.,It was hard to get consistent efforts and there is a question as to whether this reflects a maximal maneuver.,Poor effort, data can not be interpreted.} FVC: ***L FEV1: ***L, ***% predicted FEV1/FVC ratio: ***% Interpretation: {Blank single:19197::Spirometry consistent with mild obstructive disease,Spirometry consistent with moderate obstructive disease,Spirometry consistent with severe obstructive  disease,Spirometry consistent with possible restrictive disease,Spirometry consistent with mixed obstructive and restrictive disease,Spirometry uninterpretable due to technique,Spirometry consistent with normal pattern,No overt abnormalities noted given today's efforts}.  Please see scanned spirometry results for details.  Skin Testing: {Blank single:19197::Select foods,Environmental allergy panel,Environmental allergy panel and select foods,Food allergy panel,None,Deferred due to recent antihistamines use}. *** Results discussed with patient/family.   Past Medical History: Patient Active Problem List   Diagnosis Date Noted  . Nexplanon  in place 02/14/2022  . Contraceptive management 01/27/2022  . Vaginal delivery 01/07/2022  . Group B streptococcal infection in pregnancy 12/22/2021  . Liver mass 08/07/2021  . Supervision of normal pregnancy 05/27/2021  . Anxiety and depression 01/05/2016  . 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 Liver mass    9cm mass MRI after delivery  . Medical history non-contributory    Past Surgical History: Past Surgical History:  Procedure Laterality Date  . NO PAST SURGERIES    . WISDOM TOOTH EXTRACTION  2013   Medication List:  Current Outpatient Medications  Medication Sig Dispense Refill  . acetaminophen  (TYLENOL ) 325 MG tablet Take 2 tablets (650 mg total) by mouth every 4 (four) hours as needed (for pain scale < 4). (Patient not taking: Reported on 03/20/2022) 30 tablet 0  . EPINEPHrine  0.3 mg/0.3 mL IJ SOAJ injection Inject 0.3 mg into the muscle as needed for anaphylaxis. 1 each 0  . etonogestrel  (NEXPLANON ) 68 MG IMPL implant 1 each by Subdermal route once.    . fluticasone  (FLONASE ) 50 MCG/ACT nasal spray Place 1 spray into both nostrils daily. (Patient not taking: Reported on 03/20/2022) 16 g 0  . ibuprofen  (ADVIL ) 600 MG tablet Take 1 tablet (600 mg total) by mouth every  6 (six) hours. (Patient not taking: Reported on 03/20/2022) 30 tablet 0  .  lidocaine  (XYLOCAINE ) 2 % solution Use as directed 15 mLs in the mouth or throat every 4 (four) hours as needed for mouth pain (sore throat). (Patient not taking: Reported on 03/20/2022) 100 mL 0  . prenatal vitamin w/FE, FA (NATACHEW) 29-1 MG CHEW chewable tablet Chew 1 tablet by mouth daily at 12 noon. (Patient not taking: Reported on 03/20/2022)     No current facility-administered medications for this visit.   Allergies: Allergies  Allergen Reactions  . Egg-Derived Products Hives and Swelling     Mother and patient state swelling of throat, itching of throat, hives.  . Peanut -Containing Drug Products Anaphylaxis    All nut products per patient  . Fish Allergy Hives   Social History: Social History   Socioeconomic History  . Marital status: Single    Spouse name: Not on file  . Number of children: Not on file  . Years of education: Not on file  . Highest education level: Not on file  Occupational History  . Not on file  Tobacco Use  . Smoking status: Never  . Smokeless tobacco: Never  Vaping Use  . Vaping status: Never Used  Substance and Sexual Activity  . Alcohol use: No  . Drug use: No  . Sexual activity: Yes    Birth control/protection: None, Condom, Implant  Other Topics Concern  . Not on file  Social History Narrative   Lives with mother Halford Pouch, 3 younger siblings Richada, Darrien and Nyjha.    Social Drivers of Corporate investment banker Strain: Not on file  Food Insecurity: Not on file  Transportation Needs: Not on file  Physical Activity: Not on file  Stress: Not on file  Social Connections: Not on file   Lives in a ***. Smoking: *** Occupation: ***  Environmental HistorySurveyor, minerals in the house: Network engineer in the family room: {Blank single:19197::yes,no} Carpet in the bedroom: {Blank single:19197::yes,no} Heating:  {Blank single:19197::electric,gas,heat pump} Cooling: {Blank single:19197::central,window,heat pump} Pet: {Blank single:19197::yes ***,no}  Family History: Family History  Problem Relation Age of Onset  . Diabetes Maternal Aunt   . Diabetes Maternal Uncle   . Hypertension Maternal Uncle   . Diabetes Maternal Grandmother   . Hypertension Maternal Grandmother   . Cancer Maternal Grandfather   . Hearing loss Neg Hx   . Stroke Neg Hx   . Asthma Neg Hx    Problem                               Relation Asthma                                   *** Eczema                                *** Food allergy                          *** Allergic rhino conjunctivitis     ***  Review of Systems  Constitutional:  Negative for appetite change, chills, fever and unexpected weight change.  HENT:  Negative for congestion and rhinorrhea.   Eyes:  Negative for itching.  Respiratory:  Negative for cough, chest tightness, shortness of breath and wheezing.   Cardiovascular:  Negative for chest  pain.  Gastrointestinal:  Negative for abdominal pain.  Genitourinary:  Negative for difficulty urinating.  Skin:  Negative for rash.  Neurological:  Negative for headaches.    Objective: There were no vitals taken for this visit. There is no height or weight on file to calculate BMI. Physical Exam Vitals and nursing note reviewed.  Constitutional:      Appearance: Normal appearance. She is well-developed.  HENT:     Head: Normocephalic and atraumatic.     Right Ear: Tympanic membrane and external ear normal.     Left Ear: Tympanic membrane and external ear normal.     Nose: Nose normal.     Mouth/Throat:     Mouth: Mucous membranes are moist.     Pharynx: Oropharynx is clear.  Eyes:     Conjunctiva/sclera: Conjunctivae normal.  Cardiovascular:     Rate and Rhythm: Normal rate and regular rhythm.     Heart sounds: Normal heart sounds. No murmur heard.    No friction rub. No gallop.   Pulmonary:     Effort: Pulmonary effort is normal.     Breath sounds: Normal breath sounds. No wheezing, rhonchi or rales.  Musculoskeletal:     Cervical back: Neck supple.  Skin:    General: Skin is warm.     Findings: No rash.  Neurological:     Mental Status: She is alert and oriented to person, place, and time.  Psychiatric:        Behavior: Behavior normal.   The plan was reviewed with the patient/family, and all questions/concerned were addressed.  It was my pleasure to see Chiyeko today and participate in her care. Please feel free to contact me with any questions or concerns.  Sincerely,  Orlan Cramp, DO Allergy & Immunology  [facility]  [place] office: [phone] [facility] office: [phone]

## 2023-11-28 ENCOUNTER — Ambulatory Visit (INDEPENDENT_AMBULATORY_CARE_PROVIDER_SITE_OTHER): Admitting: Clinical

## 2023-11-28 DIAGNOSIS — F331 Major depressive disorder, recurrent, moderate: Secondary | ICD-10-CM | POA: Diagnosis not present

## 2023-11-28 DIAGNOSIS — H5213 Myopia, bilateral: Secondary | ICD-10-CM | POA: Diagnosis not present

## 2023-11-28 NOTE — Progress Notes (Signed)
   THERAPIST PROGRESS NOTE  Session Time: 30 min  Participation Level: Active  Behavioral Response: CasualAlertDepressed  Type of Therapy: Individual Therapy  Treatment Goals addressed: client will engage in at least 80% of scheduled individual therapy appointments  ProgressTowards Goals: Progressing  Interventions: CBT and Supportive  Summary:  April [name] is a 33 y.o. female who presents for the scheduled appointment oriented times five, appropriately dressed and friendly. Client denied hallucinations and delusions. Client reported she has been doing okay but having some stress. Client reported her main concern is her 86 year old son. Client reported his behavior issues have had her concerned. Client describes it as he did not completely outgrow the terrible 2 phase. Client reported certain things have gotten a little better but he still has tantrums as one example. Client reported she feels bad because she feels she has to force herself to be affectionate with him sometimes. Client reported she is not sure how to cope with it. Client reported she has talked to her best friend but not her mother about it. Client reported otherwise she has been doing well. Client reported also she has been feeling tired with trying to actively work on becoming closer with her siblings on her father side. Client reported they get along fine but maybe feels fatigued with trying to go out her way for them at the moment. Evidence of progress towards goal:  client reported 1 positive of being open to enlisting support from her mother to talk and help her figure out stress with her son.   Suicidal/Homicidal: Nowithout intent/plan  Therapist Response:  Therapist began the appointment asking the client how she has been doing. Therapist engaged with active listening and positive emotional support. Therapist used cbt to ask the client about the source of her negative thoughts and feelings. Therapist used  cbt to normalize how she feels and encourage her to talk with her mom about some parenting advice. Therapist used CBT ask the client to identify her progress with frequency of use with coping skills with continued practice in her daily activity.    Therapist assigned the client homework to practice self care.   Plan: Return again in 4 weeks.  Diagnosis: mdd, recurrent episode, moderate with anxious distress  Collaboration of Care: Patient refused AEB none requested by the client.  Patient/Guardian was advised Release of Information must be obtained prior to any record release in order to collaborate their care with an outside provider. Patient/Guardian was advised if they have not already done so to contact the registration department to sign all necessary forms in order for us  to release information regarding their care.   Consent: Patient/Guardian gives verbal consent for treatment and assignment of benefits for services provided during this visit. Patient/Guardian expressed understanding and agreed to proceed.   Kimari Lienhard Y Craven Crean, LCSW 11/28/2023

## 2023-11-29 ENCOUNTER — Encounter: Payer: Self-pay | Admitting: Allergy & Immunology

## 2023-11-29 ENCOUNTER — Ambulatory Visit (INDEPENDENT_AMBULATORY_CARE_PROVIDER_SITE_OTHER): Admitting: Allergy & Immunology

## 2023-11-29 VITALS — BP 100/70 | HR 95 | Temp 97.9°F | Ht 62.0 in | Wt 190.5 lb

## 2023-11-29 DIAGNOSIS — J310 Chronic rhinitis: Secondary | ICD-10-CM | POA: Diagnosis not present

## 2023-11-29 DIAGNOSIS — T7800XA Anaphylactic reaction due to unspecified food, initial encounter: Secondary | ICD-10-CM

## 2023-11-29 DIAGNOSIS — T7800XD Anaphylactic reaction due to unspecified food, subsequent encounter: Secondary | ICD-10-CM

## 2023-11-29 NOTE — Progress Notes (Signed)
 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 Allergy with anaphylaxis due to food - We are going to get some labs to look into your food allergies. - We may follow this up with skin testing. - EpiPen  is up to date. - Emergency action plan provided.  - Our goal is to get these back into your diet. - Egg Component Panel - IgE Nut Prof. w/Component Rflx - Allergen Sesame f10 - Allergen Egg Yolk f75 - We are going to get a tryptase to look at mast cell activity.   2. Chronic rhinitis - Because of insurance stipulations, we cannot do skin testing on the same day as your first visit. - We are all working to fight this, but for now we need to do two separate visits.  - We will know more after we do testing at the next visit.  - The skin testing visit can be squeezed in at your convenience.  - Then we can make a more full plan to address all of your symptoms. - Be sure to stop your antihistamines for 3 days before this appointment.   3. Return in about 2 weeks (around 12/13/2023) for SKIN TESTING (1-55 + PN/TN + EGG + SEAFOOD). You can have the follow up appointment with Dr. Iva or a Nurse Practicioner (our Nurse Practitioners are excellent and always have Physician oversight!).   This note in its entirety was forwarded to the Provider who requested this consultation.  Subjective:   April [name] is a 33 y.o. female presenting today for evaluation of  Chief Complaint  Patient presents with   Establish Care    States that she has food allergies to fish eggs peanuts and tree nuts.    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  Discussed the use of AI scribe software for clinical note transcription with the patient and/or guardian, who gave verbal consent to proceed.  April [name] (Na - Tay - Zha) was referred by Cleotilde Lukes, DO.     April [name] is a 33 y.o. female presenting for an evaluation of food allergies. She has lived in [place] for eight months before returning to [place].   Food Allergy Symptom History: She has experienced multiple food allergies since approximately [age] years of age, including allergies to fish, eggs, peanuts, and some tree nuts. Exposure to fish, even from the aroma, causes throat swelling and difficulty breathing. She has recently been prescribed an EpiPen  for emergency use. Regarding egg allergy, she has become tolerant to baked eggs but still experiences symptoms with scrambled eggs. She can consume boiled eggs without issue but avoids [place] toast.  She works at [employer] and occasionally encounters fish when her family cooks it, but she avoids being around during these times. She  has not undergone any allergy testing previously, and this is her first evaluation for these allergies.  Otherwise, there is no history of other atopic diseases, including asthma, stinging insect allergies, or contact dermatitis. There is no significant infectious history. Vaccinations are up to date.    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   Peanut-containing Drug Products Anaphylaxis   All nut products per patient   Fish Allergy Hives        Medication List        Accurate as of November 29, 2023  1:16 PM. If you have any questions, ask your nurse or doctor.          STOP taking these medications    acetaminophen  325 MG tablet Commonly known as: Tylenol  Stopped by: Marty Morton Shaggy   lidocaine  2 % solution Commonly known as: XYLOCAINE  Stopped by: Marty Morton Shaggy   prenatal vitamin w/FE, FA 29-1 MG Chew chewable tablet Stopped by: Marty Morton Shaggy       TAKE these medications    EPINEPHrine  0.3 mg/0.3 mL Soaj injection Commonly known as: EPI-PEN Inject 0.3 mg into the muscle as needed for anaphylaxis.   etonogestrel  68 MG Impl implant Commonly known as: NEXPLANON  1 each by Subdermal route once.   fluticasone  50 MCG/ACT nasal spray Commonly known as: FLONASE  Place 1 spray into both nostrils daily.   ibuprofen  600 MG tablet Commonly known as: ADVIL  Take 1 tablet (600 mg total) by mouth every 6 (six) hours.        Birth History: non-contributory  Developmental History: non-contributory  Past Surgical History: Past Surgical History:  Procedure Laterality Date   NO PAST SURGERIES     WISDOM TOOTH EXTRACTION  2013     Family History: Family History  Problem Relation Age of Onset   Asthma Brother    Diabetes Maternal Aunt    Diabetes Maternal Uncle    Hypertension Maternal Uncle    Diabetes Maternal Grandmother    Hypertension Maternal Grandmother    Cancer Maternal Grandfather    Asthma Son    Hearing loss Neg Hx    Stroke Neg Hx       Social History: April [name] lives at home with her family. She lives in an apartment. There is hardwood throughout the home. There is electric heating and central cooling. There are no dust mite coverings on the bedding. There is no tobacco exposure in the home. She currently works as an Musician. There are no fumes, chemicals, or dust involved. [place].    Review of systems otherwise negative other than that mentioned in the HPI.    Objective:   Blood pressure 100/70, pulse 95, temperature 97.9 F (36.6 C), height 5' 2 (1.575 m), weight 190 lb 8 oz (86.4 kg), SpO2 98%, not currently breastfeeding. Body mass index is 34.84 kg/m.     Physical Exam Vitals reviewed.  Constitutional:  Appearance: She is well-developed.  HENT:     Head: Normocephalic and atraumatic.     Right Ear: Tympanic membrane, ear canal and external ear normal. No drainage, swelling or tenderness. Tympanic membrane is not injected, scarred, erythematous, retracted or bulging.     Left Ear: Tympanic membrane, ear canal and external ear normal. No drainage, swelling or tenderness. Tympanic membrane is not injected, scarred, erythematous, retracted or bulging.     Nose: No nasal deformity, septal deviation, mucosal edema or rhinorrhea.     Right Turbinates: Enlarged, swollen and pale.     Left Turbinates: Enlarged, swollen and pale.     Right Sinus: No maxillary sinus tenderness or frontal sinus tenderness.     Left Sinus: No maxillary sinus tenderness or frontal sinus tenderness.     Mouth/Throat:     Mouth: Mucous membranes are not pale and not dry.     Pharynx: Uvula midline.  Eyes:     General:        Right eye: No discharge.        Left eye: No discharge.     Conjunctiva/sclera: Conjunctivae normal.     Right eye: Right conjunctiva is not injected. No chemosis.    Left eye: Left conjunctiva is not injected. No chemosis.    Pupils: Pupils are equal,  round, and reactive to light.  Cardiovascular:     Rate and Rhythm: Normal rate and regular rhythm.     Heart sounds: Normal heart sounds.  Pulmonary:     Effort: Pulmonary effort is normal. No tachypnea, accessory muscle usage or respiratory distress.     Breath sounds: Normal breath sounds. No wheezing, rhonchi or rales.     Comments: Moving air well in all lung fields. No increased work of breathing noted.  Chest:     Chest wall: No tenderness.  Abdominal:     Tenderness: There is no abdominal tenderness. There is no guarding or rebound.  Lymphadenopathy:     Head:     Right side of head: No submandibular, tonsillar or occipital adenopathy.     Left side of head: No submandibular, tonsillar or occipital adenopathy.     Cervical: No cervical adenopathy.  Skin:    General: Skin is warm.     Capillary Refill: Capillary refill takes less than 2 seconds.     Coloration: Skin is not pale.     Findings: No abrasion, erythema, petechiae or rash. Rash is not papular, urticarial or vesicular.  Neurological:     Mental Status: She is alert.  Psychiatric:        Behavior: Behavior is cooperative.      Diagnostic studies: labs sent instead        Marty Shaggy, MD [facility] 

## 2023-11-29 NOTE — Patient Instructions (Addendum)
 1. Allergy with anaphylaxis due to food - We are going to get some labs to look into your food allergies. - We may follow this up with skin testing. - EpiPen  is up to date. - Emergency action plan provided.  - Our goal is to get these back into your diet. - Egg Component Panel - IgE Nut Prof. w/Component Rflx - Allergen Sesame f10 - Allergen Egg Yolk f75 - We are going to get a tryptase to look at mast cell activity.   2. Chronic rhinitis - Because of insurance stipulations, we cannot do skin testing on the same day as your first visit. - We are all working to fight this, but for now we need to do two separate visits.  - We will know more after we do testing at the next visit.  - The skin testing visit can be squeezed in at your convenience.  - Then we can make a more full plan to address all of your symptoms. - Be sure to stop your antihistamines for 3 days before this appointment.   3. Return in about 2 weeks (around 12/13/2023) for SKIN TESTING (1-55 + PN/TN + EGG + SEAFOOD). You can have the follow up appointment with Dr. Iva or a Nurse Practicioner (our Nurse Practitioners are excellent and always have Physician oversight!).    Please inform us  of any Emergency Department visits, hospitalizations, or changes in symptoms. Call us  before going to the ED for breathing or allergy symptoms since we might be able to fit you in for a sick visit. Feel free to contact us  anytime with any questions, problems, or concerns.  It was a pleasure to meet you today!  Websites that have reliable patient information: 1. American Academy of Asthma, Allergy, and Immunology: www.aaaai.org 2. Food Allergy Research and Education (FARE): foodallergy.org 3. Mothers of Asthmatics: http://www.asthmacommunitynetwork.org 4. American College of Allergy, Asthma, and Immunology: www.acaai.org      "Like" us  on Facebook and Instagram for our latest updates!      A healthy democracy works best when  [employer] participate! Make sure you are registered to vote! If you have moved or changed any of your contact information, you will need to get this updated before voting! Scan the QR codes below to learn more!

## 2023-12-03 LAB — IGE NUT PROF. W/COMPONENT RFLX

## 2023-12-04 LAB — ALLERGEN EGG YOLK F75: IgE Egg (Yolk): 0.21 kU/L — AB

## 2023-12-04 LAB — EGG COMPONENT PANEL
F232-IgE Ovalbumin: 0.18 kU/L — AB
F233-IgE Ovomucoid: 0.38 kU/L — AB

## 2023-12-04 LAB — PEANUT COMPONENTS
F352-IgE Ara h 8: <0.1 kU/L
F422-IgE Ara h 1: <0.1 kU/L
F423-IgE Ara h 2: 2.09 kU/L — AB
F424-IgE Ara h 3: 0.89 kU/L — AB
F427-IgE Ara h 9: <0.1 kU/L
F447-IgE Ara h 6: 3.41 kU/L — AB

## 2023-12-04 LAB — TRYPTASE: Tryptase: 4.5 ug/L (ref 2.2–13.2)

## 2023-12-04 LAB — IGE NUT PROF. W/COMPONENT RFLX
F017-IgE Hazelnut (Filbert): 4.14 kU/L — AB
F018-IgE Brazil Nut: 0.52 kU/L — AB
F202-IgE Cashew Nut: 4.79 kU/L — AB
F202-IgE Cashew Nut: <0.1 kU/L
F256-IgE Walnut: 11.1 kU/L — AB
Jug R 3 IgE: 5.47 kU/L — AB
Macadamia Nut, IgE: 0.81 kU/L — AB
Peanut, IgE: 5.8 kU/L — AB
Pecan Nut IgE: 3.14 kU/L — AB

## 2023-12-04 LAB — PANEL 604239: ANA O 3 IgE: 8.79 kU/L — AB

## 2023-12-04 LAB — PANEL 604721
Jug R 1 IgE: 7.39 kU/L — AB
Jug R 3 IgE: <0.1 kU/L

## 2023-12-04 LAB — PANEL 604726
Cor A 1 IgE: <0.1 kU/L
Cor A 14 IgE: 6.93 kU/L — AB
Cor A 8 IgE: <0.1 kU/L
Cor A 9 IgE: 1.55 kU/L — AB

## 2023-12-04 LAB — ALLERGEN SESAME F10: Sesame Seed IgE: 0.14 kU/L — AB

## 2023-12-04 LAB — ALLERGEN COMPONENT COMMENTS

## 2023-12-04 LAB — PANEL 604350: Ber E 1 IgE: 0.95 kU/L — AB

## 2023-12-07 ENCOUNTER — Ambulatory Visit: Payer: Self-pay | Admitting: Allergy & Immunology

## 2023-12-11 ENCOUNTER — Ambulatory Visit (INDEPENDENT_AMBULATORY_CARE_PROVIDER_SITE_OTHER): Admitting: Allergy & Immunology

## 2023-12-11 ENCOUNTER — Encounter: Payer: Self-pay | Admitting: Allergy & Immunology

## 2023-12-11 DIAGNOSIS — J3089 Other allergic rhinitis: Secondary | ICD-10-CM

## 2023-12-11 DIAGNOSIS — T7800XA Anaphylactic reaction due to unspecified food, initial encounter: Secondary | ICD-10-CM

## 2023-12-11 DIAGNOSIS — T7800XD Anaphylactic reaction due to unspecified food, subsequent encounter: Secondary | ICD-10-CM | POA: Diagnosis not present

## 2023-12-11 DIAGNOSIS — J302 Other seasonal allergic rhinitis: Secondary | ICD-10-CM

## 2023-12-11 NOTE — Progress Notes (Signed)
 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 Allergy  with anaphylaxis due to food - Labs were very low to egg and you were negative on skin testing. - Consider doing a scrambled egg challenge in the office. - Keep introducing foods that contain eggs (see recommendations below). - AVOID peanuts, tree nuts, and fin fish. - Testing was VERY positive to these. - Emergency Action Plan updated today. - EpiPen  training reviewed.   2. Chronic rhinitis - Testing today showed: grasses, weeds, trees, indoor molds, outdoor molds, and dog - Copy of test results provided.  - Avoidance measures provided. - Continue with: Flonase  (fluticasone ) one spray per nostril daily (AIM FOR EAR ON EACH SIDE) - Start taking: Xyzal (levocetirizine) 5mg  tablet once daily  - You can use an extra dose of the antihistamine, if needed, for breakthrough symptoms.  - Consider nasal saline rinses 1-2 times daily to remove allergens from the nasal cavities as well as help with mucous clearance (this is especially helpful to do before the nasal sprays are given) - Strongly consider allergy  shots as a means of long-term control. - Allergy  shots re-train and reset the immune system to ignore environmental allergens and decrease the resulting immune response to those allergens (sneezing, itchy watery eyes, runny nose, nasal congestion, etc).    - Allergy  shots improve symptoms in 75-85% of patients.  - We can discuss more at the next appointment if the medications are not working for you.  3. Return in about 3 months (around  03/11/2024). You can have the follow up appointment with Dr. Iva or a Nurse Practicioner (our Nurse Practitioners are excellent and always have Physician oversight!).     Subjective:   April [name] is a 33 y.o. female presenting today for follow up of No chief complaint on file.   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  Discussed the use of AI scribe software for clinical note transcription with the patient and/or guardian, who gave verbal consent to proceed.  April [name] is a 33 y.o. female presenting for skin testing. She was last seen on August 21st. We could not do testing because her insurance company does not cover testing on the same day as a New Patient visit. She has been off of all antihistamines 3 days in anticipation of the testing.   It looks like we did testing when we saw her in August.  Her egg levels were very low and we talked about doing a stovetop challenge in the office.  Peanut  levels were very elevated including comparative testing to the high risk parts of the peanut  protein.  Tree nut levels were all very elevated.  Oral immunotherapy was discussed.  Xolair  is another option.  She has a history of food allergies since childhood, including allergies to tree nuts, peanuts, and fish. She has never consumed tree nuts or peanuts intentionally but has had accidental exposures, after which she noticed 'the red stuff which which I think she means hives. She diligently avoids these foods and reads labels to prevent accidental ingestion.  She can  consume shellfish without issues but reports a history of allergy  to fish.  Regarding eggs, she can eat boiled eggs but has experienced throat discomfort with scrambled eggs, which she has avoided for years.  Environmental allergies include grasses, weeds, and trees, but she does not react to ragweed or cats. She grew up with dogs and does not report issues with them.  Otherwise, there have been no changes to her past medical history, surgical history, family history, or social history.    Review of systems otherwise negative other than that mentioned in the HPI.    Objective:   not currently breastfeeding. There is no height or weight on file to calculate BMI.    Physical exam deferred since this was a skin testing appointment only.   Diagnostic studies:   Allergy  Studies:     Airborne Adult Perc - 12/11/23 0909     Time Antigen Placed 0909    Allergen Manufacturer Jestine    Location Back    Number of Test 55    1. Control-Buffer 50% Glycerol Negative    2. Control-Histamine 2+    3. Bahia Negative    4. [place] 2+    5. Johnson 2+    6. [place]  Blue 3+    7. Meadow Fescue 3+    8. Perennial Rye 3+    9. Timothy 3+    10. Ragweed Mix Negative    11. Cocklebur Negative    12. Plantain,  English 2+    13. Baccharis 2+    14. Dog Fennel 2+    15. Gu[place] Thistle 2+    16. Lamb's Quarters 2+    17. Sheep Sorrell 3+    18. Rough Pigweed 2+    19. Marsh Elder, Rough 2+    20. Mugwort, Common 2+    21. Box, Elder 2+    22. Cedar, red 3+    23. Sweet Gum Negative    24. Pecan Pollen 3+    25. Pine Mix Negative    26. Walnut, Black Pollen 2+    27. Red Mulberry 2+    28. Ash Mix 2+    29. Birch Mix Negative    30. Beech American Negative    31. Cottonwood, [place] Negative    32. Hickory, White 4+    33. Maple Mix 4+    34. Oak, [place] Mix 3+    35. Sycamore Eastern 2+    36. Alternaria Alternata 2+    37. Cladosporium Herbarum 3+    38. Aspergillus Mix  3+    39. Penicillium Mix 2+    40. Bipolaris Sorokiniana (Helminthosporium) 3+    41. Drechslera Spicifera (Curvularia) 3+    42. Mucor Plumbeus Negative    43. Fusarium Moniliforme Negative    44. Aureobasidium Pullulans (pullulara) 3+    45. Rhizopus Oryzae 2+    46. Botrytis Cinera 3+    47. Epicoccum Nigrum 2+    48. Phoma Betae 2+    49. Dust Mite Mix Negative    50. Cat Hair 10,000 BAU/ml Negative    51.  Dog Epithelia 3+    52. Mixed Feathers Negative    53. Horse Epithelia Negative    54. Cockroach, German Negative    55. Tobacco Leaf Negative          Food Adult Perc - 12/11/23 0900     Time Antigen Placed 9090    Allergen Manufacturer Jestine    Location Back    1. Peanut  --   5 x 8   7. Egg White, Chicken Negative    8. Shellfish Mix Negative    9. Fish Mix 2+    10. Cashew --   13 x 15   11. Walnut Food --   5 x 9   12. Almond Negative    13. Hazelnut --   20 x 25   14. Pecan Food --   5 x 7   15. Pistachio --   15 x 21   16. [place] Nut --   13 x 15   18. Trout --   8 x 10   19. Tuna --   5 x 7   20. Salmon --   6 x 10   21. Flounder --   9 x 12   22. Codfish --   10 x 13   23. Shrimp Negative    24. Crab Negative    25. Lobster Negative    26. Oyster --   3 x 5   27. Scallops Negative          Allergy  testing results were read and interpreted by myself, documented by clinical staff.      Marty Shaggy, MD  [facility] 

## 2023-12-11 NOTE — Patient Instructions (Addendum)
 1. Allergy  with anaphylaxis due to food - Labs were very low to egg and you were negative on skin testing. - Consider doing a scrambled egg challenge in the office. - Keep introducing foods that contain eggs (see recommendations below). - AVOID peanuts, tree nuts, and fin fish. - Testing was VERY positive to these. - Emergency Action Plan updated today. - EpiPen  training reviewed.   2. Chronic rhinitis - Testing today showed: grasses, weeds, trees, indoor molds, outdoor molds, and dog - Copy of test results provided.  - Avoidance measures provided. - Continue with: Flonase  (fluticasone ) one spray per nostril daily (AIM FOR EAR ON EACH SIDE) - Start taking: Xyzal (levocetirizine) 5mg  tablet once daily  - You can use an extra dose of the antihistamine, if needed, for breakthrough symptoms.  - Consider nasal saline rinses 1-2 times daily to remove allergens from the nasal cavities as well as help with mucous clearance (this is especially helpful to do before the nasal sprays are given) - Strongly consider allergy  shots as a means of long-term control. - Allergy  shots re-train and reset the immune system to ignore environmental allergens and decrease the resulting immune response to those allergens (sneezing, itchy watery eyes, runny nose, nasal congestion, etc).    - Allergy  shots improve symptoms in 75-85% of patients.  - We can discuss more at the next appointment if the medications are not working for you.  3. Return in about 3 months (around 03/11/2024). You can have the follow up appointment with Dr. Iva or a Nurse Practicioner (our Nurse Practitioners are excellent and always have Physician oversight!).    Please inform us  of any Emergency Department visits, hospitalizations, or changes in symptoms. Call us  before going to the ED for breathing or allergy  symptoms since we might be able to fit you in for a sick visit. Feel free to contact us  anytime with any questions, problems,  or concerns.  It was a pleasure to see you again today!  Websites that have reliable patient information: 1. American Academy of Asthma, Allergy , and Immunology: www.aaaai.org 2. Food Allergy  Research and Education (FARE): foodallergy.org 3. Mothers of Asthmatics: http://www.asthmacommunitynetwork.org 4. American College of Allergy , Asthma, and Immunology: www.acaai.org      "Like" us  on Facebook and Instagram for our latest updates!      A healthy democracy works best when [employer] participate! Make sure you are registered to vote! If you have moved or changed any of your contact information, you will need to get this updated before voting! Scan the QR codes below to learn more!      Airborne Adult Perc - 12/11/23 0909     Time Antigen Placed 9090    Allergen Manufacturer Jestine    Location Back    Number of Test 55    1. Control-Buffer 50% Glycerol Negative    2. Control-Histamine 2+    3. Bahia Negative    4. [place] 2+    5. Johnson 2+    6. [place]  Blue 3+    7. Meadow Fescue 3+    8. Perennial Rye 3+    9. Timothy 3+    10. Ragweed Mix Negative    11. Cocklebur Negative    12. Plantain,  English 2+    13. Baccharis 2+    14. Dog Fennel 2+    15. Gu[place] Thistle 2+    16. Lamb's Quarters 2+    17. Sheep Sorrell 3+    18. Rough Pigweed 2+  19. Marsh Elder, Rough 2+    20. Mugwort, Common 2+    21. Box, Elder 2+    22. Cedar, red 3+    23. Sweet Gum Negative    24. Pecan Pollen 3+    25. Pine Mix Negative    26. Walnut, Black Pollen 2+    27. Red Mulberry 2+    28. Ash Mix 2+    29. Birch Mix Negative    30. Beech American Negative    31. Cottonwood, [place] Negative    32. Hickory, White 4+    33. Maple Mix 4+    34. Oak, [place] Mix 3+    35. Sycamore Eastern 2+    36. Alternaria Alternata 2+    37. Cladosporium Herbarum 3+    38. Aspergillus Mix 3+    39. Penicillium Mix 2+    40. Bipolaris Sorokiniana (Helminthosporium) 3+    41.  Drechslera Spicifera (Curvularia) 3+    42. Mucor Plumbeus Negative    43. Fusarium Moniliforme Negative    44. Aureobasidium Pullulans (pullulara) 3+    45. Rhizopus Oryzae 2+    46. Botrytis Cinera 3+    47. Epicoccum Nigrum 2+    48. Phoma Betae 2+    49. Dust Mite Mix Negative    50. Cat Hair 10,000 BAU/ml Negative    51.  Dog Epithelia 3+    52. Mixed Feathers Negative    53. Horse Epithelia Negative    54. Cockroach, German Negative    55. Tobacco Leaf Negative          Food Adult Perc - 12/11/23 0900     Time Antigen Placed 9090    Allergen Manufacturer Jestine    Location Back    1. Peanut  --   5 x 8   7. Egg White, Chicken Negative    8. Shellfish Mix Negative    9. Fish Mix 2+    10. Cashew --   13 x 15   11. Walnut Food --   5 x 9   12. Almond Negative    13. Hazelnut --   20 x 25   14. Pecan Food --   5 x 7   15. Pistachio --   15 x 21   16. [place] Nut --   13 x 15   18. Trout --   8 x 10   19. Tuna --   5 x 7   20. Salmon --   6 x 10   21. Flounder --   9 x 12   22. Codfish --   10 x 13   23. Shrimp Negative    24. Crab Negative    25. Lobster Negative    26. Oyster --   3 x 5   27. Scallops Negative         Reducing Pollen Exposure  The American Academy of Allergy , Asthma and Immunology suggests the following steps to reduce your exposure to pollen during allergy  seasons.    Do not hang sheets or clothing out to dry; pollen may collect on these items. Do not mow lawns or spend time around freshly cut grass; mowing stirs up pollen. Keep windows closed at night.  Keep car windows closed while driving. Minimize morning activities outdoors, a time when pollen counts are usually at their highest. Stay indoors as much as possible when pollen counts or humidity is high and on windy days when pollen tends to remain  in the air longer. Use air conditioning when possible.  Many air conditioners have filters that trap the pollen spores. Use a HEPA room air  filter to remove pollen form the indoor air you breathe.  Control of Mold Allergen   Mold and fungi can grow on a variety of surfaces provided certain temperature and moisture conditions exist.  Outdoor molds grow on plants, decaying vegetation and soil.  The major outdoor mold, Alternaria and Cladosporium, are found in very high numbers during hot and dry conditions.  Generally, a late Summer - Fall peak is seen for common outdoor fungal spores.  Rain will temporarily lower outdoor mold spore count, but counts rise rapidly when the rainy period ends.  The most important indoor molds are Aspergillus and Penicillium.  Dark, humid and poorly ventilated basements are ideal sites for mold growth.  The next most common sites of mold growth are the bathroom and the kitchen.  Outdoor (Seasonal) Mold Control   Use air conditioning and keep windows closed Avoid exposure to decaying vegetation. Avoid leaf raking. Avoid grain handling. Consider wearing a face mask if working in moldy areas.    Indoor (Perennial) Mold Control    Maintain humidity below 50%. Clean washable surfaces with 5% bleach solution. Remove sources e.g. contaminated carpets.    Control of Dog or Cat Allergen  Avoidance is the best way to manage a dog or cat allergy . If you have a dog or cat and are allergic to dog or cats, consider removing the dog or cat from the home. If you have a dog or cat but don't want to find it a new home, or if your family wants a pet even though someone in the household is allergic, here are some strategies that may help keep symptoms at bay:  Keep the pet out of your bedroom and restrict it to only a few rooms. Be advised that keeping the dog or cat in only one room will not limit the allergens to that room. Don't pet, hug or kiss the dog or cat; if you do, wash your hands with soap and water. High-efficiency particulate air (HEPA) cleaners run continuously in a bedroom or living room can reduce  allergen levels over time. Regular use of a high-efficiency vacuum cleaner or a central vacuum can reduce allergen levels. Giving your dog or cat a bath at least once a week can reduce airborne allergen.  Allergy  Shots  Allergies are the result of a chain reaction that starts in the immune system. Your immune system controls how your body defends itself. For instance, if you have an allergy  to pollen, your immune system identifies pollen as an invader or allergen. Your immune system overreacts by producing antibodies called Immunoglobulin E (IgE). These antibodies travel to cells that release chemicals, causing an allergic reaction.  The concept behind allergy  immunotherapy, whether it is received in the form of shots or tablets, is that the immune system can be desensitized to specific allergens that trigger allergy  symptoms. Although it requires time and patience, the payback can be long-term relief. Allergy  injections contain a dilute solution of those substances that you are allergic to based upon your skin testing and allergy  history.   How Do Allergy  Shots Work?  Allergy  shots work much like a vaccine. Your body responds to injected amounts of a particular allergen given in increasing doses, eventually developing a resistance and tolerance to it. Allergy  shots can lead to decreased, minimal or no allergy  symptoms.  There generally  are two phases: build-up and maintenance. Build-up often ranges from three to six months and involves receiving injections with increasing amounts of the allergens. The shots are typically given once or twice a week, though more rapid build-up schedules are sometimes used.  The maintenance phase begins when the most effective dose is reached. This dose is different for each person, depending on how allergic you are and your response to the build-up injections. Once the maintenance dose is reached, there are longer periods between injections, typically two to four  weeks.  Occasionally doctors give cortisone-type shots that can temporarily reduce allergy  symptoms. These types of shots are different and should not be confused with allergy  immunotherapy shots.  Who Can Be Treated with Allergy  Shots?  Allergy  shots may be a good treatment approach for people with allergic rhinitis (hay fever), allergic asthma, conjunctivitis (eye allergy ) or stinging insect allergy .   Before deciding to begin allergy  shots, you should consider:   The length of allergy  season and the severity of your symptoms  Whether medications and/or changes to your environment can control your symptoms  Your desire to avoid long-term medication use  Time: allergy  immunotherapy requires a major time commitment  Cost: may vary depending on your insurance coverage  Allergy  shots for children age 52 and older are effective and often well tolerated. They might prevent the onset of new allergen sensitivities or the progression to asthma.  Allergy  shots are not started on patients who are pregnant but can be continued on patients who become pregnant while receiving them. In some patients with other medical conditions or who take certain common medications, allergy  shots may be of risk. It is important to mention other medications you talk to your allergist.   What are the two types of build-ups offered:   RUSH or Rapid Desensitization -- one day of injections lasting from 8:30-4:30pm, injections every 1 hour.  Approximately half of the build-up process is completed in that one day.  The following week, normal build-up is resumed, and this entails ~16 visits either weekly or twice weekly, until reaching your "maintenance dose" which is continued weekly until eventually getting spaced out to every month for a duration of 3 to 5 years. The regular build-up appointments are nurse visits where the injections are administered, followed by required monitoring for 30 minutes.    Traditional  build-up -- weekly visits for 6 -12 months until reaching "maintenance dose", then continue weekly until eventually spacing out to every 4 weeks as above. At these appointments, the injections are administered, followed by required monitoring for 30 minutes.     Either way is acceptable, and both are equally effective. With the rush protocol, the advantage is that less time is spent here for injections overall AND you would also reach maintenance dosing faster (which is when the clinical benefit starts to become more apparent). Not everyone is a candidate for rapid desensitization.   IF we proceed with the RUSH protocol, there are premedications which must be taken the day before and the day after the rush only (this includes antihistamines, steroids, and Singulair).  After the rush day, no prednisone  or Singulair is required, and we just recommend antihistamines taken on your injection day.  What Is An Estimate of the Costs?  If you are interested in starting allergy  injections, please check with your insurance company about your coverage for both allergy  vial sets and allergy  injections.  Please do so prior to making the appointment to start injections.  The following are CPT codes to give to your insurance company. These are the amounts we BILL to the insurance company, but the amount YOU WILL PAY and WE RECEIVE IS SUBSTANTIALLY LESS and depends on the contracts we have with different insurance companies.   Amount Billed to Insurance One allergy  vial set  CPT 95165   $ 1200     Two allergy  vial set  CPT 95165   $ 2400     Three allergy  vial set  CPT 95165   $ 3600     One injection   CPT 95115   $ 35  Two injections   CPT 95117   $ 40 RUSH (Rapid Desensitization) CPT 95180 x 8 hours $500/hour  Regarding the allergy  injections, your co-pay may or may not apply with each injection, so please confirm this with your insurance company. When you start allergy  injections, 1 or 2 sets of vials are made  based on your allergies.  Not all patients can be on one set of vials. A set of vials lasts 6 months to a year depending on how quickly you can proceed with your build-up of your allergy  injections. Vials are personalized for each patient depending on their specific allergens.  How often are allergy  injection given during the build-up period?   Injections are given at least weekly during the build-up period until your maintenance dose is achieved. Per the doctor's discretion, you may have the option of getting allergy  injections two times per week during the build-up period. However, there must be at least 48 hours between injections. The build-up period is usually completed within 6-12 months depending on your ability to schedule injections and for adjustments for reactions. When maintenance dose is reached, your injection schedule is gradually changed to every two weeks and later to every three weeks. Injections will then continue every 4 weeks. Usually, injections are continued for a total of 3-5 years.   When Will I Feel Better?  Some may experience decreased allergy  symptoms during the build-up phase. For others, it may take as long as 12 months on the maintenance dose. If there is no improvement after a year of maintenance, your allergist will discuss other treatment options with you.  If you aren't responding to allergy  shots, it may be because there is not enough dose of the allergen in your vaccine or there are missing allergens that were not identified during your allergy  testing. Other reasons could be that there are high levels of the allergen in your environment or major exposure to non-allergic triggers like tobacco smoke.  What Is the Length of Treatment?  Once the maintenance dose is reached, allergy  shots are generally continued for three to five years. The decision to stop should be discussed with your allergist at that time. Some people may experience a permanent reduction of allergy   symptoms. Others may relapse and a longer course of allergy  shots can be considered.  What Are the Possible Reactions?  The two types of adverse reactions that can occur with allergy  shots are local and systemic. Common local reactions include very mild redness and swelling at the injection site, which can happen immediately or several hours after. Report a delayed reaction from your last injection. These include arm swelling or runny nose, watery eyes or cough that occurs within 12-24 hours after injection. A systemic reaction, which is less common, affects the entire body or a particular body system. They are usually mild and typically respond quickly to medications. Signs  include increased allergy  symptoms such as sneezing, a stuffy nose or hives.   Rarely, a serious systemic reaction called anaphylaxis can develop. Symptoms include swelling in the throat, wheezing, a feeling of tightness in the chest, nausea or dizziness. Most serious systemic reactions develop within 30 minutes of allergy  shots. This is why it is strongly recommended you wait in your doctor's office for 30 minutes after your injections. Your allergist is trained to watch for reactions, and his or her staff is trained and equipped with the proper medications to identify and treat them.   Report to the nurse immediately if you experience any of the following symptoms: swelling, itching or redness of the skin, hives, watery eyes/nose, breathing difficulty, excessive sneezing, coughing, stomach pain, diarrhea, or light headedness. These symptoms may occur within 15-20 minutes after injection and may require medication.   Who Should Administer Allergy  Shots?  The preferred location for receiving shots is your prescribing allergist's office. Injections can sometimes be given at another facility where the physician and staff are trained to recognize and treat reactions, and have received instructions by your prescribing allergist.  What  if I am late for an injection?   Injection dose will be adjusted depending upon how many days or weeks you are late for your injection.   What if I am sick?   Please report any illness to the nurse before receiving injections. She may adjust your dose or postpone injections depending on your symptoms. If you have fever, flu, sinus infection or chest congestion it is best to postpone allergy  injections until you are better. Never get an allergy  injection if your asthma is causing you problems. If your symptoms persist, seek out medical care to get your health problem under control.  What If I am or Become Pregnant:  Women that become pregnant should schedule an appointment with [facility] before receiving any further allergy  injections.  Advancing to lesser cooked egg in the diet:   Store bought items where egg is in the lower half of the ingredient list "may contain" or "made in a facility with egg" are fine to try now. Begin with baked recipes like cakes, muffins, cupcakes, meatloaf quick breads (like banana bread or pumpkin bread), and other foods baked at 350 F for 25 minutes or longer. Eat these foods several times per week to continue to build tolerance to egg protein. Other examples include quick lasagna, meatloaf, pasta bake, or casserole, oven baked bread chicken or cornbread. Do this for at least 2 months before moving to Step 2.   If you or your child did well with the food mentioned above, scones or biscuits may be added or other food items that are baked for around 15 minutes. Other examples include meatballs, brownies, or cookies. Egg noodles baked into a casserole for at least 15 minutes may be tried as well (example tuna and noodles, but start with half of a normal serving the first time). Do this for at least 2 months before moving to Step 3.  If you or your child did well with Step 2, you can move to pancakes and waffles. Store bought waffles or pancakes are  likely less concentrated in egg and may be better tolerated at first, rather than homemade. Do this for at least 2 months before moving to Step 4.   If you or your child did well with Step 3, call our clinic to schedule a stovetop egg challenge with [place] toast or scrambled  egg.   NOTE: Continue to avoid more concentrated sources of eggs such as angel food cake, mayonnaise, meringues, salad dressings, and other foods with lesser cooked eggs (like frozen custard) until you discuss with your allergist.

## 2023-12-19 ENCOUNTER — Ambulatory Visit (HOSPITAL_COMMUNITY): Admitting: Clinical

## 2023-12-29 ENCOUNTER — Other Ambulatory Visit: Payer: Self-pay

## 2023-12-29 ENCOUNTER — Encounter (HOSPITAL_COMMUNITY): Payer: Self-pay

## 2023-12-29 ENCOUNTER — Emergency Department (HOSPITAL_COMMUNITY)

## 2023-12-29 ENCOUNTER — Emergency Department (HOSPITAL_COMMUNITY)
Admission: EM | Admit: 2023-12-29 | Discharge: 2023-12-29 | Disposition: A | Discharge status: Home / Self Care | Attending: Emergency Medicine | Admitting: Emergency Medicine

## 2023-12-29 DIAGNOSIS — Z91010 Allergy to peanuts: Secondary | ICD-10-CM | POA: Diagnosis not present

## 2023-12-29 DIAGNOSIS — M25511 Pain in right shoulder: Secondary | ICD-10-CM | POA: Insufficient documentation

## 2023-12-29 MED ORDER — NAPROXEN 375 MG PO TABS: 375.0000 mg | ORAL_TABLET | Freq: Two times a day (BID) | ORAL | 0 refills | Status: AC

## 2023-12-29 NOTE — ED Triage Notes (Signed)
 Pt came in via POV d/t possible spraining her Rt shoulder (per pt). States for the past 2 months it has been bothering & now it has progressed to aching even if she isn't moving it. A/Ox4, rates her pain 7/10 during. Denies any recent falls/injuries, does do a lot of reaching & moving objects at her job.

## 2023-12-29 NOTE — ED Provider Notes (Signed)
  [facility] Provider Note   CSN: [id number] Arrival date & time: 12/29/23  1643     Patient presents with: Shoulder Pain   April [name] is a 33 y.o. female.  {Add pertinent medical, surgical, social history, OB history to HPI:32947}  Shoulder Pain      Prior to Admission medications   Medication Sig Start Date End Date Taking? Authorizing Provider  EPINEPHrine  0.3 mg/0.3 mL IJ SOAJ injection Inject 0.3 mg into the muscle as needed for anaphylaxis. 09/13/23   Christia Budds, MD  etonogestrel  (NEXPLANON ) 68 MG IMPL implant 1 each by Subdermal route once.    [provider]  fluticasone  (FLONASE ) 50 MCG/ACT nasal spray Place 1 spray into both nostrils daily. 01/30/22   Petrucelli, Samantha R, PA-C  ibuprofen  (ADVIL ) 600 MG tablet Take 1 tablet (600 mg total) by mouth every 6 (six) hours. 01/08/22   Autry-Lott, Rojean, DO    Allergies: Egg-derived products, Peanut -containing drug products, and Fish allergy     Review of Systems  Updated Vital Signs BP 126/72 (BP Location: Left Arm)   Pulse 88   Temp 98.2 F (36.8 C)   Resp 17   Ht 5' 2 (1.575 m)   Wt 86.2 kg   SpO2 100%   BMI 34.75 kg/m   Physical Exam  (all labs ordered are listed, but only abnormal results are displayed) Labs Reviewed - No data to display  EKG: None  Radiology: DG Shoulder Right Result Date: 12/29/2023 CLINICAL DATA:  Right shoulder pain. EXAM: RIGHT SHOULDER - 2+ VIEW COMPARISON:  None Available. FINDINGS: There is no evidence of fracture or dislocation. There is no evidence of arthropathy or other focal bone abnormality. Soft tissues are unremarkable. IMPRESSION: Negative. Electronically Signed   By: Franky Crease M.D.   On: 12/29/2023 17:34    {Document cardiac monitor, telemetry assessment procedure when appropriate:32947} Procedures   Medications Ordered in the ED - No data to display    {Click here for ABCD2, HEART and other  calculators REFRESH Note before signing:1}                              Medical Decision Making Amount and/or Complexity of Data Reviewed Radiology: ordered.   ***  {Document critical care time when appropriate  Document review of labs and clinical decision tools ie CHADS2VASC2, etc  Document your independent review of radiology images and any outside records  Document your discussion with family members, caretakers and with consultants  Document social determinants of health affecting pt's care  Document your decision making why or why not admission, treatments were needed:32947:::1}   Final diagnoses:  None    ED Discharge Orders     None

## 2023-12-29 NOTE — ED Triage Notes (Signed)
 Pt to ER with c/o right shoulder pain x 2 months.  No known injury.

## 2023-12-29 NOTE — ED Notes (Signed)
 Pt provided discharge instructions and prescription information. Pt was given the opportunity to ask questions and questions were answered.

## 2023-12-29 NOTE — Discharge Instructions (Addendum)
 General instructions Take over-the-counter and prescription medicines only as told by your provider. Exercise may help with pain management. Perform exercises if told by your provider. You may be referred to a physical therapist to help in your recovery process. Keep all follow-up visits in order to avoid any type of permanent shoulder disability or chronic pain problems. Contact a health care provider if: Your pain is not relieved with medicines. New pain develops in your arm, hand, or fingers. You loosen your sling and your arm, hand, or fingers remain tingly, numb, swollen, or painful. Get help right away if: Your arm, hand, or fingers turn white or blue.

## 2024-01-07 ENCOUNTER — Ambulatory Visit (INDEPENDENT_AMBULATORY_CARE_PROVIDER_SITE_OTHER): Admitting: Clinical

## 2024-01-07 DIAGNOSIS — F331 Major depressive disorder, recurrent, moderate: Secondary | ICD-10-CM | POA: Diagnosis not present

## 2024-01-15 DIAGNOSIS — M25511 Pain in right shoulder: Secondary | ICD-10-CM | POA: Diagnosis not present

## 2024-01-29 ENCOUNTER — Ambulatory Visit: Attending: Physician Assistant

## 2024-01-29 ENCOUNTER — Other Ambulatory Visit: Payer: Self-pay

## 2024-01-29 DIAGNOSIS — M6281 Muscle weakness (generalized): Secondary | ICD-10-CM | POA: Diagnosis not present

## 2024-01-29 DIAGNOSIS — M25511 Pain in right shoulder: Secondary | ICD-10-CM | POA: Insufficient documentation

## 2024-01-29 DIAGNOSIS — R293 Abnormal posture: Secondary | ICD-10-CM | POA: Diagnosis not present

## 2024-01-29 NOTE — Therapy (Signed)
 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   Medical history non-contributory    Past Surgical History:  Procedure Laterality Date   NO PAST SURGERIES     WISDOM TOOTH EXTRACTION  2013   Patient Active Problem List   Diagnosis Date Noted   Nexplanon  in place 02/14/2022   Contraceptive management 01/27/2022   Vaginal delivery 01/07/2022   Group B streptococcal infection in pregnancy 12/22/2021   Liver mass 08/07/2021   Supervision of normal pregnancy 05/27/2021   Anxiety and depression 01/05/2016   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    PCP: Cleotilde Lukes, DO  REFERRING PROVIDER: Danton Leas D, PA   REFERRING DIAG: M25.511 (ICD-10-CM) - Pain in joint of right shoulder   THERAPY DIAG:  Right shoulder pain, unspecified chronicity - Plan: PT plan of care cert/re-cert  Muscle weakness (generalized) - Plan: PT plan of care cert/re-cert  Abnormal posture - Plan: PT plan of care cert/re-cert  Rationale for Evaluation and Treatment: Rehabilitation  ONSET DATE: 3 months  SUBJECTIVE:                                                                                                                                                                                      SUBJECTIVE STATEMENT: Pt presents to PT with roughly 3 month hx of R shoulder pain that has been increasing in severity. Denies N/T  or referral into R UE, pain is located to R anterior shoulder and feels deep. No trauma or MOI, pain increases with lifting and OH motion, work duties also increase pain. Wants to get back to working out at the gym as well.   Hand dominance: Right  PERTINENT HISTORY: See PMH  PAIN:  Are you having pain?  Yes: NPRS scale: 0/10 Worst: 10/10 Pain location: R anterior shoulder Pain description: deep ache Aggravating factors: lifting, reaching OH Relieving factors: medication,   PRECAUTIONS: None  RED FLAGS: None  WEIGHT BEARING RESTRICTIONS: No  FALLS:  Has patient fallen in last 6 months? No  LIVING ENVIRONMENT: Lives with: lives with their family Lives in: House/apartment  OCCUPATION: [employer]  PLOF: Independent  PATIENT GOALS:decrease pain   NEXT MD VISIT:   OBJECTIVE:  Note: Objective measures were completed at Evaluation unless otherwise noted.  DIAGNOSTIC FINDINGS:  See imaging   PATIENT SURVEYS:  Quick Dash:  QUICK DASH  Please rate your ability do the following activities in the last week by selecting the number below the appropriate response.   Activities Rating  Open a tight or new jar.  1 = No difficulty   Do heavy household chores (e.g., wash walls, floors). 3 = Moderate difficulty  Carry a shopping bag or briefcase 2 = Mild difficulty  Wash your back. 5 = Unable  Use a knife to cut food. 1 = No difficulty   Recreational activities in which you take some force or impact through your arm, shoulder or hand (e.g., golf, hammering, tennis, etc.). 5 = Unable  During the past week, to what extent has your arm, shoulder or hand problem interfered with your normal social activities with family, friends, neighbors or groups?  5 = Extremely  During the past week, were you limited in your work or other regular daily activities as a result of your arm, shoulder or hand problem? 4 = Very limited  Rate the severity of the following symptoms in the last week:  Arm, Shoulder, or hand pain. 5 = Extreme  Rate the severity of the following symptoms in the last week: Tingling (pins and needles) in your arm, shoulder or hand. 2 = Mild  During the past week, how much difficulty have you had sleeping because of the pain in your arm, shoulder or hand?  1 = No difficulty   (A QuickDASH score may not be calculated if there is greater than 1 missing item.)  Quick Dash Disability/Symptom Score: 52.3  Minimally Clinically Important Difference (MCID): 15-20 points  Flavio, F. et al. (2013). Minimally clinically important difference of the disabilities of the arm, shoulder, and hand outcome measures (DASH) and its shortened version (Quick DASH). Journal of Orthopaedic & Sports Physical Therapy, 44(1), 30-39)   COGNITION: Overall cognitive status: Within functional limits for tasks assessed     SENSATION: WFL  POSTURE: Rounded shoulder   UPPER EXTREMITY ROM:   Active ROM Right eval Left eval  Shoulder flexion 160 WNL  Shoulder extension    Shoulder abduction  WNL  Shoulder adduction    Shoulder internal rotation R PSIS WNL  Shoulder external rotation WNL WNL  Elbow flexion    Elbow extension    Wrist flexion    Wrist extension    Wrist ulnar deviation    Wrist radial deviation    Wrist pronation    Wrist supination    (Blank rows = not tested)  UPPER EXTREMITY MMT:  MMT Right eval Left eval  Shoulder flexion 3+ 4  Shoulder extension    Shoulder abduction    Shoulder adduction    Shoulder internal rotation 3 4  Shoulder external rotation 3 4  Middle trapezius    Lower trapezius    Elbow flexion    Elbow extension    Wrist flexion    Wrist extension    Wrist ulnar deviation    Wrist radial deviation    Wrist pronation    Wrist supination    Grip strength (lbs)    (Blank rows =  not tested)  SHOULDER SPECIAL TESTS: Impingement tests: Hawkins/Kennedy impingement test: negative and Painful arc test: negative SLAP  lesions: DNT Instability tests: DNT Rotator cuff assessment: DNT Biceps assessment: DNT  JOINT MOBILITY TESTING:  Normal scapular and R GH movement  PALPATION:  Slight TTP to R AC joint and R pec minor   TREATMENT: [facility] Adult PT Treatment:                                                DATE: 01/29/2024 Therapeutic Exercise: Corner pec stretch x 30  90/90 pec stretch with towel x 30 Standing row x 10 blue band Seated ER x 10 RTB  PATIENT EDUCATION: Education details: eval findings, Quick DASH, HEP, POC Person educated: Patient Education method: Explanation, Demonstration, and Handouts Education comprehension: verbalized understanding and returned demonstration  HOME EXERCISE PROGRAM: Access Code: [email or web address] URL: [email or web address] Date: 01/29/2024 Prepared by: Alm Kingdom  Exercises - Corner Pec Major Stretch  - 1 x daily - 7 x weekly - 2-3 reps - 30 sec hold - Open Book Chest Rotation Stretch on Foam 1/2 Roll  - 1 x daily - 7 x weekly - 2-3 reps - 30 sec hold - Standing Shoulder Row with Anchored Resistance  - 1 x daily - 7 x weekly - 3 sets - 12 reps - blue band hold - Shoulder External Rotation and Scapular Retraction with Resistance  - 1 x daily - 7 x weekly - 3 sets - 10 reps - red band hold  ASSESSMENT:  CLINICAL IMPRESSION: Patient is a 33 y.o. F who was seen today for physical therapy evaluation and treatment for subacute R shoulder pain. Physical findings are consistent with with referring provider impression as pt demonstrates decrease in R shoulder strength and ROM into IR. Quick DASH score indicates moderate disability in performance of home ADLs and work/community activities. Pt would benefit from skilled PT services working on improving muscle length and dynamic stabilizer strength in order to decrease pain and improve comfort.    OBJECTIVE IMPAIRMENTS: decreased activity tolerance, decreased endurance, decreased ROM, decreased strength,  postural dysfunction, and pain  ACTIVITY LIMITATIONS: carrying, lifting, toileting, dressing, reach over head, and hygiene/grooming  PARTICIPATION LIMITATIONS: cleaning, driving, shopping, community activity, occupation, and yard work  PERSONAL FACTORS: Time since onset of injury/illness/exacerbation are also affecting patient's functional outcome.   REHAB POTENTIAL: Excellent  CLINICAL DECISION MAKING: Stable/uncomplicated  EVALUATION COMPLEXITY: Low   GOALS: Goals reviewed with patient? No  SHORT TERM GOALS: Target date: 02/20/2024   Pt will be compliant and knowledgeable with initial HEP for improved comfort and carryover Baseline: initial HEP given  Goal status: INITIAL  2.  Pt will self report R shoulder pain no greater than 7/10 for improved comfort and functional ability Baseline: 10/10 at worst Goal status: INITIAL   LONG TERM GOALS: Target date: 03/26/2024   Pt will decrease Quick DASH disability score to no greater than 40% as proxy for functional improvement Baseline: 52.3%  Goal status: INITIAL  2.  Pt will self report right shoulder pain no greater than 4/10 for improved comfort and functional ability Baseline: 10/10 at worst Goal status: INITIAL   3.  Pt will improve R shoulder IR behind back to at least T10 for improved comfort and functional ability with home ADLs Baseline: R PSIS Goal status: INITIAL  4.  Pt will  improve R shoulder MMT to at least 5/5 for all tested motions for improved comfort and functional ability Baseline: see chart Goal status: INITIAL  PLAN:  PT FREQUENCY: 1-2x/week  PT DURATION: 8 weeks  PLANNED INTERVENTIONS: 97164- PT Re-evaluation, 97110-Therapeutic exercises, 97530- Therapeutic activity, [id number]- Neuromuscular re-education, 97535- Self Care, 02859- Manual therapy, [id number]- Electrical stimulation (unattended), [id number]- Electrical stimulation (manual), 97016- Vasopneumatic device, 20560 (1-2 muscles), 20561 (3+ muscles)- Dry  Needling, and Patient/Family education  PLAN FOR NEXT SESSION: assess HEP response, periscapular strengthening, pec stretching  For all possible CPT codes, reference the Planned Interventions line above.     Check all conditions that are expected to impact treatment: {Conditions expected to impact treatment:Musculoskeletal disorders   If treatment provided at initial evaluation, no treatment charged due to lack of authorization.       Alm JAYSON Kingdom, PT 01/30/2024, 3:10 PM

## 2024-01-30 ENCOUNTER — Encounter (HOSPITAL_COMMUNITY): Payer: Self-pay

## 2024-02-01 ENCOUNTER — Encounter: Payer: Self-pay | Admitting: Family Medicine

## 2024-02-04 ENCOUNTER — Ambulatory Visit (HOSPITAL_COMMUNITY): Admitting: Clinical

## 2024-02-04 ENCOUNTER — Encounter (HOSPITAL_COMMUNITY): Payer: Self-pay

## 2024-02-06 DIAGNOSIS — G8929 Other chronic pain: Secondary | ICD-10-CM | POA: Diagnosis not present

## 2024-02-06 DIAGNOSIS — M542 Cervicalgia: Secondary | ICD-10-CM | POA: Diagnosis not present

## 2024-02-06 DIAGNOSIS — M549 Dorsalgia, unspecified: Secondary | ICD-10-CM | POA: Diagnosis not present

## 2024-02-06 DIAGNOSIS — N62 Hypertrophy of breast: Secondary | ICD-10-CM | POA: Diagnosis not present

## 2024-02-08 NOTE — Progress Notes (Signed)
   THERAPIST PROGRESS NOTE Virtual Visit via Video Note  I connected with April [name] on 01/07/2024 at  4:00 PM EDT by a video enabled telemedicine application and verified that I am speaking with the correct person using two identifiers.  Location: Patient: home Provider: office   I discussed the limitations of evaluation and management by telemedicine and the availability of in person appointments. The patient expressed understanding and agreed to proceed.   Follow Up Instructions: I discussed the assessment and treatment plan with the patient. The patient was provided an opportunity to ask questions and all were answered. The patient agreed with the plan and demonstrated an understanding of the instructions.   The patient was advised to call back or seek an in-person evaluation if the symptoms worsen or if the condition fails to improve as anticipated.    Session Time: 16 minutes  Participation Level: Active  Behavioral Response: CasualAlertAnxious  Type of Therapy: Individual Therapy  Treatment Goals addressed: client will engage in at least 80% of scheduled individual psychotherapy sessions  ProgressTowards Goals: Progressing  Interventions: CBT and Supportive  Summary:  April [name] is a 33 y.o. female who presents for the scheduled appointment oriented times five, appropriately dressed and friendly. Client denied hallucinations and delusions. Client reported on today she is doing fairly okay. Client reported a challenge she is noticing is that her boyfriend now has 2 jobs. Client reported it has been different in their communication not being as much. Client reported she is trying to figure out if she was triggered by that he is busy and could not talk to her opposed to feelings of being ignored. Client reported she is still working through some residual from her daughter father of how he would not communicate for a period of time which made hr feel negatively.  Client reported she has had some issues with her sons negative behaviors. Client reported she is open to considering a therapist for him. Evidence of progress towards goal:  client reported 1 skill use of being mindful of negative behaviors and brainstorming what her triggers are.    Suicidal/Homicidal: Nowithout intent/plan  Therapist Response:  Therapist began the appointment asking the client how she has been doing. Therapist engaged with active listening and positive emotional support. Therapist used cbt to engage and give her time to discuss her thoughts and concerns. Therapist used cbt to collaborate and brainstorm some ideas to help with her stressors and triggers. Therapist used CBT ask the client to identify her progress with frequency of use with coping skills with continued practice in her daily activity.      Plan: Return again in 3 weeks.  Diagnosis: mdd, recurrent episode moderate with anxious distress  Collaboration of Care: Patient refused AEB none requested by the client.  Patient/Guardian was advised Release of Information must be obtained prior to any record release in order to collaborate their care with an outside provider. Patient/Guardian was advised if they have not already done so to contact the registration department to sign all necessary forms in order for us  to release information regarding their care.   Consent: Patient/Guardian gives verbal consent for treatment and assignment of benefits for services provided during this visit. Patient/Guardian expressed understanding and agreed to proceed.   Sebastian Dzik Y Jesaiah Fabiano, LCSW 01/07/2024

## 2024-02-12 ENCOUNTER — Encounter: Payer: Self-pay | Admitting: Physical Therapy

## 2024-02-12 ENCOUNTER — Ambulatory Visit: Attending: Physician Assistant | Admitting: Physical Therapy

## 2024-02-12 DIAGNOSIS — M25511 Pain in right shoulder: Secondary | ICD-10-CM | POA: Insufficient documentation

## 2024-02-12 DIAGNOSIS — M6281 Muscle weakness (generalized): Secondary | ICD-10-CM | POA: Insufficient documentation

## 2024-02-12 DIAGNOSIS — R293 Abnormal posture: Secondary | ICD-10-CM | POA: Diagnosis not present

## 2024-02-12 NOTE — Therapy (Signed)
 OUTPATIENT PHYSICAL THERAPY SHOULDER TREATMENT   Patient Name: April [name] MRN: [id number] DOB:1990-10-04, 33 y.o., female Today's Date: 02/12/2024  END OF SESSION:  PT End of Session - 02/12/24 1312     Visit Number 2    Number of Visits 17    Date for Recertification  03/26/24    Authorization Type [employer]    Authorization Time Period 01/29/24-03/28/24    Authorization - Visit Number 2    Authorization - Number of Visits 4    PT Start Time 1315    PT Stop Time 1345    PT Time Calculation (min) 30 min          Past Medical History:  Diagnosis Date   Liver mass    9cm mass MRI after delivery   Medical history non-contributory    Past Surgical History:  Procedure Laterality Date   NO PAST SURGERIES     WISDOM TOOTH EXTRACTION  2013   Patient Active Problem List   Diagnosis Date Noted   Nexplanon  in place 02/14/2022   Contraceptive management 01/27/2022   Vaginal delivery 01/07/2022   Group B streptococcal infection in pregnancy 12/22/2021   Liver mass 08/07/2021   Supervision of normal pregnancy 05/27/2021   Anxiety and depression 01/05/2016   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 Pain is 2/10.    EVAL: Pt presents to PT with roughly 3 month hx of R shoulder pain that has been increasing in severity. Denies N/T or referral into R UE, pain is located to R anterior  shoulder and feels deep. No trauma or MOI, pain increases with lifting and OH motion, work duties also increase pain. Wants to get back to working out at the gym as well.   Hand dominance: Right  PERTINENT HISTORY: See PMH  PAIN:  Are you having pain?  Yes: NPRS scale: 2/10 Worst: 10/10 Pain location: R anterior shoulder Pain description: deep ache Aggravating factors: lifting, reaching OH Relieving factors: medication,   PRECAUTIONS: None  RED FLAGS: None   WEIGHT BEARING RESTRICTIONS: No  FALLS:  Has patient fallen in last 6 months? No  LIVING ENVIRONMENT: Lives with: lives with their family Lives in: House/apartment  OCCUPATION: [employer]  PLOF: Independent  PATIENT GOALS:decrease pain   NEXT MD VISIT:   OBJECTIVE:  Note: Objective measures were completed at Evaluation unless otherwise noted.  DIAGNOSTIC FINDINGS:  See imaging   PATIENT SURVEYS:  Quick Dash:  QUICK DASH  Please rate your ability do the following activities in the last week by selecting the number below the appropriate response.   Activities Rating  Open a tight or new jar.  1 = No difficulty   Do heavy household chores (e.g., wash walls, floors). 3 = Moderate difficulty  Carry a shopping bag or briefcase 2 = Mild difficulty  Wash your back. 5 = Unable  Use a knife to cut food. 1 = No difficulty   Recreational activities in which you take some force or impact through your arm, shoulder or hand (e.g., golf, hammering, tennis, etc.). 5 = Unable  During the past week, to what extent has your arm, shoulder or hand problem interfered with your normal social activities with family, friends, neighbors or groups?  5 = Extremely  During the past week, were you limited in your work or other regular daily activities as a result of your arm, shoulder or hand problem? 4 = Very limited  Rate the severity of the following symptoms in the last week: Arm, Shoulder, or hand pain. 5 = Extreme  Rate the  severity of the following symptoms in the last week: Tingling (pins and needles) in your arm, shoulder or hand. 2 = Mild  During the past week, how much difficulty have you had sleeping because of the pain in your arm, shoulder or hand?  1 = No difficulty   (A QuickDASH score may not be calculated if there is greater than 1 missing item.)  Quick Dash Disability/Symptom Score: 52.3  Minimally Clinically Important Difference (MCID): 15-20 points  Flavio, F. et al. (2013). Minimally clinically important difference of the disabilities of the arm, shoulder, and hand outcome measures (DASH) and its shortened version (Quick DASH). Journal of Orthopaedic & Sports Physical Therapy, 44(1), 30-39)   COGNITION: Overall cognitive status: Within functional limits for tasks assessed     SENSATION: WFL  POSTURE: Rounded shoulder   UPPER EXTREMITY ROM:   Active ROM Right eval Left eval  Shoulder flexion 160 WNL  Shoulder extension    Shoulder abduction  WNL  Shoulder adduction    Shoulder internal rotation R PSIS WNL  Shoulder external rotation WNL WNL  Elbow flexion    Elbow extension    Wrist flexion    Wrist extension    Wrist ulnar deviation    Wrist radial deviation    Wrist pronation    Wrist supination    (Blank rows = not tested)  UPPER EXTREMITY MMT:  MMT Right eval Left eval   Shoulder flexion 3+ 4   Shoulder extension     Shoulder abduction     Shoulder adduction     Shoulder internal rotation 3 4   Shoulder external rotation 3 4   Middle trapezius     Lower trapezius     Elbow flexion     Elbow extension     Wrist flexion     Wrist extension     Wrist ulnar deviation     Wrist radial deviation     Wrist pronation     Wrist supination  Grip strength (lbs)     (Blank rows = not tested)  SHOULDER SPECIAL TESTS: Impingement tests: Hawkins/Kennedy impingement test: negative and Painful arc test: negative SLAP lesions: DNT Instability tests:  DNT Rotator cuff assessment: DNT Biceps assessment: DNT  JOINT MOBILITY TESTING:  Normal scapular and R GH movement  PALPATION:  Slight TTP to R AC joint and R pec minor   TREATMENT: [facility] Adult PT Treatment:                                                DATE: 02/12/24 Therapeutic Exercise: Seated shoulder ER RTB 10 x 3  Standing Horiz abduct RTB 10 x 2  Standing alt star pattern 3 x 3 pulses  ROW black band 10 x 2  Corner pec stretch x 3 levels  Supine 1# shoulder flexion 10 x 2 , long lever  SL shoulder abdct 1# 10 x 2  Updated HEP    [facility] Adult PT Treatment:                                                DATE: 01/29/2024 Therapeutic Exercise: Corner pec stretch x 30  90/90 pec stretch with towel x 30 Standing row x 10 blue band Seated ER x 10 RTB  PATIENT EDUCATION: Education details: eval findings, Quick DASH, HEP, POC Person educated: Patient Education method: Explanation, Demonstration, and Handouts Education comprehension: verbalized understanding and returned demonstration  HOME EXERCISE PROGRAM: Access Code: [email or web address] URL: [email or web address] Date: 01/29/2024 Prepared by: Alm Kingdom  Exercises - Corner Pec Major Stretch  - 1 x daily - 7 x weekly - 2-3 reps - 30 sec hold - Open Book Chest Rotation Stretch on Foam 1/2 Roll  - 1 x daily - 7 x weekly - 2-3 reps - 30 sec hold - Standing Shoulder Row with Anchored Resistance  - 1 x daily - 7 x weekly - 3 sets - 12 reps - blue band hold - Shoulder External Rotation and Scapular Retraction with Resistance  - 1 x daily - 7 x weekly - 3 sets - 10 reps - red band hold Added  - Standing Shoulder Horizontal Abduction with Resistance  - 1 x daily - 7 x weekly - 2 sets - 10 reps - Alternating star pattern  - 1 x daily - 7 x weekly - 3 sets - 3-5 reps   ASSESSMENT:  CLINICAL IMPRESSION: Pt reports improvement in pain and compliance with HEP. Reviewed current HEP which she demonstrates independence.  Progressed scapular bands and updated HEP. Pt reported that her shoulder felt better end of session.    EVAL: Patient is a 33 y.o. F who was seen today for physical therapy evaluation and treatment for subacute R shoulder pain. Physical findings are consistent with with referring provider impression as pt demonstrates decrease in R shoulder strength and ROM into IR. Quick DASH score indicates moderate disability in performance of home ADLs and work/community activities. Pt would benefit from skilled PT services working on improving muscle length and dynamic stabilizer strength in order to decrease pain and improve comfort.    OBJECTIVE IMPAIRMENTS: decreased activity tolerance, decreased endurance, decreased ROM, decreased strength, postural dysfunction, and pain  ACTIVITY LIMITATIONS: carrying, lifting, toileting,  dressing, reach over head, and hygiene/grooming  PARTICIPATION LIMITATIONS: cleaning, driving, shopping, community activity, occupation, and yard work  PERSONAL FACTORS: Time since onset of injury/illness/exacerbation are also affecting patient's functional outcome.   REHAB POTENTIAL: Excellent  CLINICAL DECISION MAKING: Stable/uncomplicated  EVALUATION COMPLEXITY: Low   GOALS: Goals reviewed with patient? No  SHORT TERM GOALS: Target date: 02/20/2024   Pt will be compliant and knowledgeable with initial HEP for improved comfort and carryover Baseline: initial HEP given  Goal status: MET   2.  Pt will self report R shoulder pain no greater than 7/10 for improved comfort and functional ability Baseline: 10/10 at worst 02/12/24: 2/10 Goal status: ONGOING   LONG TERM GOALS: Target date: 03/26/2024   Pt will decrease Quick DASH disability score to no greater than 40% as proxy for functional improvement Baseline: 52.3%  Goal status: INITIAL  2.  Pt will self report right shoulder pain no greater than 4/10 for improved comfort and functional ability Baseline: 10/10 at  worst Goal status: INITIAL   3.  Pt will improve R shoulder IR behind back to at least T10 for improved comfort and functional ability with home ADLs Baseline: R PSIS Goal status: INITIAL  4.  Pt will improve R shoulder MMT to at least 5/5 for all tested motions for improved comfort and functional ability Baseline: see chart Goal status: INITIAL  PLAN:  PT FREQUENCY: 1-2x/week  PT DURATION: 8 weeks  PLANNED INTERVENTIONS: 97164- PT Re-evaluation, 97110-Therapeutic exercises, 97530- Therapeutic activity, [id number]- Neuromuscular re-education, 97535- Self Care, 02859- Manual therapy, G0283- Electrical stimulation (unattended), [id number]- Electrical stimulation (manual), 97016- Vasopneumatic device, 20560 (1-2 muscles), 20561 (3+ muscles)- Dry Needling, and Patient/Family education  PLAN FOR NEXT SESSION: assess HEP response, periscapular strengthening, pec stretching  For all possible CPT codes, reference the Planned Interventions line above.     Check all conditions that are expected to impact treatment: {Conditions expected to impact treatment:Musculoskeletal disorders   If treatment provided at initial evaluation, no treatment charged due to lack of authorization.       Harlene Persons, PTA 02/12/24 1:57 PM Phone: [phone] Fax: [phone]

## 2024-02-19 ENCOUNTER — Ambulatory Visit

## 2024-02-19 DIAGNOSIS — R293 Abnormal posture: Secondary | ICD-10-CM

## 2024-02-19 DIAGNOSIS — M6281 Muscle weakness (generalized): Secondary | ICD-10-CM | POA: Diagnosis not present

## 2024-02-19 DIAGNOSIS — M25511 Pain in right shoulder: Secondary | ICD-10-CM | POA: Diagnosis not present

## 2024-02-19 NOTE — Therapy (Signed)
 OUTPATIENT PHYSICAL THERAPY SHOULDER TREATMENT   Patient Name: April [name] MRN: [id number] DOB:[birth year], 33 y.o., female Today's Date: 02/20/2024  END OF SESSION:  PT End of Session - 02/19/24 1533     Visit Number 3    Number of Visits 17    Date for Recertification  03/26/24    Authorization Type [employer]    Authorization Time Period 01/29/24-03/28/24    Authorization - Visit Number 3    Authorization - Number of Visits 4    PT Start Time 1533    PT Stop Time 1612    PT Time Calculation (min) 39 min    Activity Tolerance Patient tolerated treatment well    Behavior During Therapy [facility] for tasks assessed/performed           Past Medical History:  Diagnosis Date   Liver mass    9cm mass MRI after delivery   Medical history non-contributory    Past Surgical History:  Procedure Laterality Date   NO PAST SURGERIES     WISDOM TOOTH EXTRACTION  2013   Patient Active Problem List   Diagnosis Date Noted   Nexplanon  in place 02/14/2022   Contraceptive management 01/27/2022   Vaginal delivery 01/07/2022   Group B streptococcal infection in pregnancy 12/22/2021   Liver mass 08/07/2021   Supervision of normal pregnancy 05/27/2021   Anxiety and depression 01/05/2016   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 Has been compliant with HEP and thinks they are  helping.   EVAL: Pt presents to PT with roughly 3 month hx of R shoulder pain that has been increasing in severity. Denies N/T or referral into R UE, pain is located to R anterior shoulder and feels deep. No trauma or MOI, pain increases with lifting and OH motion, work duties also increase pain. Wants to get back to working out at the gym as well.   Hand dominance: Right  PERTINENT HISTORY: See PMH  PAIN:  Are you having pain?  Yes: NPRS scale: 2/10 Worst: 10/10 Pain  location: R anterior shoulder Pain description: deep ache Aggravating factors: lifting, reaching OH Relieving factors: medication,   PRECAUTIONS: None  RED FLAGS: None   WEIGHT BEARING RESTRICTIONS: No  FALLS:  Has patient fallen in last 6 months? No  LIVING ENVIRONMENT: Lives with: lives with their family Lives in: House/apartment  OCCUPATION: [employer]  PLOF: Independent  PATIENT GOALS:decrease pain   NEXT MD VISIT:   OBJECTIVE:  Note: Objective measures were completed at Evaluation unless otherwise noted.  DIAGNOSTIC FINDINGS:  See imaging   PATIENT SURVEYS:  Quick Dash:  QUICK DASH  Please rate your ability do the following activities in the last week by selecting the number below the appropriate response.   Activities Rating  Open a tight or new jar.  1 = No difficulty   Do heavy household chores (e.g., wash walls, floors). 3 = Moderate difficulty  Carry a shopping bag or briefcase 2 = Mild difficulty  Wash your back. 5 = Unable  Use a knife to cut food. 1 = No difficulty   Recreational activities in which you take some force or impact through your arm, shoulder or hand (e.g., golf, hammering, tennis, etc.). 5 = Unable  During the past week, to what extent has your arm, shoulder or hand problem interfered with your normal social activities with family, friends, neighbors or groups?  5 = Extremely  During the past week, were you limited in your work or other regular daily activities as a  result of your arm, shoulder or hand problem? 4 = Very limited  Rate the severity of the following symptoms in the last week: Arm, Shoulder, or hand pain. 5 = Extreme  Rate the severity of the following symptoms in the last week: Tingling (pins and needles) in your arm, shoulder or hand. 2 = Mild  During the past week, how much difficulty have you had sleeping because of the pain in your arm, shoulder or hand?  1 = No difficulty   (A QuickDASH score may not be calculated if there is greater than 1 missing item.)  Quick Dash Disability/Symptom Score: 52.3  Minimally Clinically Important Difference (MCID): 15-20 points  Flavio, F. et al. (2013). Minimally clinically important difference of the disabilities of the arm, shoulder, and hand outcome measures (DASH) and its shortened version (Quick DASH). Journal of Orthopaedic & Sports Physical Therapy, 44(1), 30-39)   COGNITION: Overall cognitive status: Within functional limits for tasks assessed     SENSATION: WFL  POSTURE: Rounded shoulder   UPPER EXTREMITY ROM:   Active ROM Right eval Left eval  Shoulder flexion 160 WNL  Shoulder extension    Shoulder abduction  WNL  Shoulder adduction    Shoulder internal rotation R PSIS WNL  Shoulder external rotation WNL WNL  Elbow flexion    Elbow extension    Wrist flexion    Wrist extension    Wrist ulnar deviation    Wrist radial deviation    Wrist pronation    Wrist supination    (Blank rows = not tested)  UPPER EXTREMITY MMT:  MMT Right eval Left eval   Shoulder flexion 3+ 4   Shoulder extension     Shoulder abduction     Shoulder adduction     Shoulder internal rotation 3 4   Shoulder external rotation 3 4   Middle trapezius     Lower trapezius     Elbow flexion     Elbow extension     Wrist flexion  Wrist extension     Wrist ulnar deviation     Wrist radial deviation     Wrist pronation     Wrist supination     Grip strength (lbs)     (Blank rows =  not tested)  SHOULDER SPECIAL TESTS: Impingement tests: Hawkins/Kennedy impingement test: negative and Painful arc test: negative SLAP lesions: DNT Instability tests: DNT Rotator cuff assessment: DNT Biceps assessment: DNT  JOINT MOBILITY TESTING:  Normal scapular and R GH movement  PALPATION:  Slight TTP to R AC joint and R pec minor   TREATMENT: [facility] Adult PT Treatment:                                                DATE: 02/19/24 UBE lvl 1.5 x 3 min while taking subjective Supine horizontal abd 2x15 GTB Supine shoulder flexion 2x10 2# FM row 2x10 17# Shoulder ext 2x10 17# R shoulder ER 2x10 RTB S/L ER 2x10 1# R S/L shoulder abd 2x10 2# R - to 90 deg Corner pec stretch 2x30  [facility] Adult PT Treatment:                                                DATE: 02/12/24 Therapeutic Exercise: Seated shoulder ER RTB 10 x 3  Standing Horiz abduct RTB 10 x 2  Standing alt star pattern 3 x 3 pulses  ROW black band 10 x 2  Corner pec stretch x 3 levels  Supine 1# shoulder flexion 10 x 2 , long lever  SL shoulder abdct 1# 10 x 2  Updated HEP    [facility] Adult PT Treatment:                                                DATE: 01/29/2024 Therapeutic Exercise: Corner pec stretch x 30  90/90 pec stretch with towel x 30 Standing row x 10 blue band Seated ER x 10 RTB  PATIENT EDUCATION: Education details: eval findings, Quick DASH, HEP, POC Person educated: Patient Education method: Explanation, Demonstration, and Handouts Education comprehension: verbalized understanding and returned demonstration  HOME EXERCISE PROGRAM: Access Code: [email or web address] URL: [email or web address] Date: 01/29/2024 Prepared by: Alm Kingdom  Exercises - Corner Pec Major Stretch  - 1 x daily - 7 x weekly - 2-3 reps - 30 sec hold - Open Book Chest Rotation Stretch on Foam 1/2 Roll  - 1 x daily - 7 x weekly - 2-3 reps - 30 sec hold - Standing Shoulder Row with Anchored Resistance  - 1 x daily -  7 x weekly - 3 sets - 12 reps - blue band hold - Shoulder External Rotation and Scapular Retraction with Resistance  - 1 x daily - 7 x weekly - 3 sets - 10 reps - red band hold Added  - Standing Shoulder Horizontal Abduction with Resistance  - 1 x daily - 7 x weekly - 2 sets - 10 reps - Alternating star pattern  - 1 x daily - 7 x weekly - 3 sets - 3-5 reps   ASSESSMENT:  CLINICAL IMPRESSION: Pt was able  to complete all prescribed exercises with no adverse effect. Today we continued to progress RTC strength for improving R shoulder functional ability. No changes to HEP today. Will continue to progress as able per POC.   EVAL: Patient is a 33 y.o. F who was seen today for physical therapy evaluation and treatment for subacute R shoulder pain. Physical findings are consistent with with referring provider impression as pt demonstrates decrease in R shoulder strength and ROM into IR. Quick DASH score indicates moderate disability in performance of home ADLs and work/community activities. Pt would benefit from skilled PT services working on improving muscle length and dynamic stabilizer strength in order to decrease pain and improve comfort.    OBJECTIVE IMPAIRMENTS: decreased activity tolerance, decreased endurance, decreased ROM, decreased strength, postural dysfunction, and pain  ACTIVITY LIMITATIONS: carrying, lifting, toileting, dressing, reach over head, and hygiene/grooming  PARTICIPATION LIMITATIONS: cleaning, driving, shopping, community activity, occupation, and yard work  PERSONAL FACTORS: Time since onset of injury/illness/exacerbation are also affecting patient's functional outcome.   REHAB POTENTIAL: Excellent  CLINICAL DECISION MAKING: Stable/uncomplicated  EVALUATION COMPLEXITY: Low   GOALS: Goals reviewed with patient? No  SHORT TERM GOALS: Target date: 02/20/2024   Pt will be compliant and knowledgeable with initial HEP for improved comfort and carryover Baseline:  initial HEP given  Goal status: MET   2.  Pt will self report R shoulder pain no greater than 7/10 for improved comfort and functional ability Baseline: 10/10 at worst 02/12/24: 2/10 Goal status: ONGOING   LONG TERM GOALS: Target date: 03/26/2024   Pt will decrease Quick DASH disability score to no greater than 40% as proxy for functional improvement Baseline: 52.3%  Goal status: INITIAL  2.  Pt will self report right shoulder pain no greater than 4/10 for improved comfort and functional ability Baseline: 10/10 at worst Goal status: INITIAL   3.  Pt will improve R shoulder IR behind back to at least T10 for improved comfort and functional ability with home ADLs Baseline: R PSIS Goal status: INITIAL  4.  Pt will improve R shoulder MMT to at least 5/5 for all tested motions for improved comfort and functional ability Baseline: see chart Goal status: INITIAL  PLAN:  PT FREQUENCY: 1-2x/week  PT DURATION: 8 weeks  PLANNED INTERVENTIONS: 97164- PT Re-evaluation, 97110-Therapeutic exercises, 97530- Therapeutic activity, [id number]- Neuromuscular re-education, 97535- Self Care, 02859- Manual therapy, G0283- Electrical stimulation (unattended), [id number]- Electrical stimulation (manual), 97016- Vasopneumatic device, 20560 (1-2 muscles), 20561 (3+ muscles)- Dry Needling, and Patient/Family education  PLAN FOR NEXT SESSION: assess HEP response, periscapular strengthening, pec stretching  For all possible CPT codes, reference the Planned Interventions line above.     Check all conditions that are expected to impact treatment: {Conditions expected to impact treatment:Musculoskeletal disorders   If treatment provided at initial evaluation, no treatment charged due to lack of authorization.       Alm JAYSON Kingdom PT  02/20/24 8:32 AM

## 2024-02-26 ENCOUNTER — Ambulatory Visit

## 2024-02-26 NOTE — Therapy (Incomplete)
 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   Medical history non-contributory    Past Surgical History:  Procedure Laterality Date   NO PAST SURGERIES     WISDOM TOOTH EXTRACTION  2013   Patient Active Problem List   Diagnosis Date Noted   Nexplanon  in place 02/14/2022   Contraceptive management 01/27/2022   Vaginal delivery 01/07/2022   Group B streptococcal infection in pregnancy 12/22/2021   Liver mass 08/07/2021   Supervision of normal pregnancy 05/27/2021   Anxiety and depression 01/05/2016   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  Rationale for Evaluation and Treatment: Rehabilitation  ONSET DATE: 3 months  SUBJECTIVE:                                                                                                                                                                                      SUBJECTIVE STATEMENT: Pt presents to PT with 1/10 R shoulder pain. Has been compliant with HEP and thinks they are helping.   EVAL: Pt presents to PT with roughly 3 month hx of R shoulder pain that has been increasing in severity. Denies N/T or referral into R UE, pain is located to R anterior shoulder and feels deep. No trauma or MOI, pain increases with lifting and OH motion, work duties also increase pain. Wants to get back to working out at the gym as well.   Hand dominance: Right  PERTINENT HISTORY: See PMH  PAIN:  Are you having pain?  Yes: NPRS scale: 2/10 Worst: 10/10 Pain location: R anterior shoulder Pain description: deep ache Aggravating factors:  lifting, reaching OH Relieving factors: medication,   PRECAUTIONS: None  RED FLAGS: None   WEIGHT BEARING RESTRICTIONS: No  FALLS:  Has patient fallen in last 6 months? No  LIVING ENVIRONMENT: Lives with: lives with their family Lives in: House/apartment  OCCUPATION: [employer]  PLOF: Independent  PATIENT GOALS:decrease pain   NEXT MD VISIT:   OBJECTIVE:  Note: Objective measures were completed at Evaluation unless otherwise noted.  DIAGNOSTIC FINDINGS:  See imaging   PATIENT SURVEYS:  Quick Dash:  QUICK DASH  Please rate your ability do the following activities in the last week by selecting the number below the appropriate response.  Activities Rating  Open a tight or new jar.  1 = No difficulty   Do heavy household chores (e.g., wash walls, floors). 3 = Moderate difficulty  Carry a shopping bag or briefcase 2 = Mild difficulty  Wash your back. 5 = Unable  Use a knife to cut food. 1 = No difficulty   Recreational activities in which you take some force or impact through your arm, shoulder or hand (e.g., golf, hammering, tennis, etc.). 5 = Unable  During the past week, to what extent has your arm, shoulder or hand problem interfered with your normal social activities with family, friends, neighbors or groups?  5 = Extremely  During the past week, were you limited in your work or other regular daily activities as a result of your arm, shoulder or hand problem? 4 = Very limited  Rate the severity of the following symptoms in the last week: Arm, Shoulder, or hand pain. 5 = Extreme  Rate the severity of the following symptoms in the last week: Tingling (pins and needles) in your arm, shoulder or hand. 2 = Mild  During the past week, how much difficulty have you had sleeping because of the pain in your arm, shoulder or hand?  1 = No difficulty   (A QuickDASH score may not be calculated if there is greater than 1 missing item.)  Quick Dash Disability/Symptom Score:  52.3  Minimally Clinically Important Difference (MCID): 15-20 points  Flavio, F. et al. (2013). Minimally clinically important difference of the disabilities of the arm, shoulder, and hand outcome measures (DASH) and its shortened version (Quick DASH). Journal of Orthopaedic & Sports Physical Therapy, 44(1), 30-39)   COGNITION: Overall cognitive status: Within functional limits for tasks assessed     SENSATION: WFL  POSTURE: Rounded shoulder   UPPER EXTREMITY ROM:   Active ROM Right eval Left eval  Shoulder flexion 160 WNL  Shoulder extension    Shoulder abduction  WNL  Shoulder adduction    Shoulder internal rotation R PSIS WNL  Shoulder external rotation WNL WNL  Elbow flexion    Elbow extension    Wrist flexion    Wrist extension    Wrist ulnar deviation    Wrist radial deviation    Wrist pronation    Wrist supination    (Blank rows = not tested)  UPPER EXTREMITY MMT:  MMT Right eval Left eval   Shoulder flexion 3+ 4   Shoulder extension     Shoulder abduction     Shoulder adduction     Shoulder internal rotation 3 4   Shoulder external rotation 3 4   Middle trapezius     Lower trapezius     Elbow flexion     Elbow extension     Wrist flexion     Wrist extension     Wrist ulnar deviation     Wrist radial deviation     Wrist pronation     Wrist supination     Grip strength (lbs)     (Blank rows = not tested)  SHOULDER SPECIAL TESTS: Impingement tests: Hawkins/Kennedy impingement test: negative and Painful arc test: negative SLAP lesions: DNT Instability tests: DNT Rotator cuff assessment: DNT Biceps assessment: DNT  JOINT MOBILITY TESTING:  Normal scapular and R GH movement  PALPATION:  Slight TTP to R AC joint and R pec minor   TREATMENT: [facility] Adult PT Treatment:  DATE: 02/19/24 UBE lvl 1.5 x 3 min while taking subjective Supine horizontal abd 2x15 GTB Supine shoulder flexion 2x10  2# FM row 2x10 17# Shoulder ext 2x10 17# R shoulder ER 2x10 RTB S/L ER 2x10 1# R S/L shoulder abd 2x10 2# R - to 90 deg Corner pec stretch 2x30  [facility] Adult PT Treatment:                                                DATE: 02/12/24 Therapeutic Exercise: Seated shoulder ER RTB 10 x 3  Standing Horiz abduct RTB 10 x 2  Standing alt star pattern 3 x 3 pulses  ROW black band 10 x 2  Corner pec stretch x 3 levels  Supine 1# shoulder flexion 10 x 2 , long lever  SL shoulder abdct 1# 10 x 2  Updated HEP    [facility] Adult PT Treatment:                                                DATE: 01/29/2024 Therapeutic Exercise: Corner pec stretch x 30  90/90 pec stretch with towel x 30 Standing row x 10 blue band Seated ER x 10 RTB  PATIENT EDUCATION: Education details: eval findings, Quick DASH, HEP, POC Person educated: Patient Education method: Explanation, Demonstration, and Handouts Education comprehension: verbalized understanding and returned demonstration  HOME EXERCISE PROGRAM: Access Code: [email or web address] URL: [email or web address] Date: 01/29/2024 Prepared by: Alm Kingdom  Exercises - Corner Pec Major Stretch  - 1 x daily - 7 x weekly - 2-3 reps - 30 sec hold - Open Book Chest Rotation Stretch on Foam 1/2 Roll  - 1 x daily - 7 x weekly - 2-3 reps - 30 sec hold - Standing Shoulder Row with Anchored Resistance  - 1 x daily - 7 x weekly - 3 sets - 12 reps - blue band hold - Shoulder External Rotation and Scapular Retraction with Resistance  - 1 x daily - 7 x weekly - 3 sets - 10 reps - red band hold Added  - Standing Shoulder Horizontal Abduction with Resistance  - 1 x daily - 7 x weekly - 2 sets - 10 reps - Alternating star pattern  - 1 x daily - 7 x weekly - 3 sets - 3-5 reps   ASSESSMENT:  CLINICAL IMPRESSION: Pt was able to complete all prescribed exercises with no adverse effect. Today we continued to progress RTC strength for improving R shoulder functional  ability. No changes to HEP today. Will continue to progress as able per POC.   EVAL: Patient is a 32 y.o. F who was seen today for physical therapy evaluation and treatment for subacute R shoulder pain. Physical findings are consistent with with referring provider impression as pt demonstrates decrease in R shoulder strength and ROM into IR. Quick DASH score indicates moderate disability in performance of home ADLs and work/community activities. Pt would benefit from skilled PT services working on improving muscle length and dynamic stabilizer strength in order to decrease pain and improve comfort.    OBJECTIVE IMPAIRMENTS: decreased activity tolerance, decreased endurance, decreased ROM, decreased strength, postural dysfunction, and pain  ACTIVITY LIMITATIONS: carrying, lifting, toileting, dressing, reach over head, and hygiene/grooming  PARTICIPATION  LIMITATIONS: cleaning, driving, shopping, community activity, occupation, and yard work  PERSONAL FACTORS: Time since onset of injury/illness/exacerbation are also affecting patient's functional outcome.   REHAB POTENTIAL: Excellent  CLINICAL DECISION MAKING: Stable/uncomplicated  EVALUATION COMPLEXITY: Low   GOALS: Goals reviewed with patient? No  SHORT TERM GOALS: Target date: 02/20/2024   Pt will be compliant and knowledgeable with initial HEP for improved comfort and carryover Baseline: initial HEP given  Goal status: MET   2.  Pt will self report R shoulder pain no greater than 7/10 for improved comfort and functional ability Baseline: 10/10 at worst 02/12/24: 2/10 Goal status: ONGOING   LONG TERM GOALS: Target date: 03/26/2024   Pt will decrease Quick DASH disability score to no greater than 40% as proxy for functional improvement Baseline: 52.3%  Goal status: INITIAL  2.  Pt will self report right shoulder pain no greater than 4/10 for improved comfort and functional ability Baseline: 10/10 at worst Goal status: INITIAL    3.  Pt will improve R shoulder IR behind back to at least T10 for improved comfort and functional ability with home ADLs Baseline: R PSIS Goal status: INITIAL  4.  Pt will improve R shoulder MMT to at least 5/5 for all tested motions for improved comfort and functional ability Baseline: see chart Goal status: INITIAL  PLAN:  PT FREQUENCY: 1-2x/week  PT DURATION: 8 weeks  PLANNED INTERVENTIONS: 97164- PT Re-evaluation, 97110-Therapeutic exercises, 97530- Therapeutic activity, [id number]- Neuromuscular re-education, 97535- Self Care, 02859- Manual therapy, G0283- Electrical stimulation (unattended), [id number]- Electrical stimulation (manual), 97016- Vasopneumatic device, 20560 (1-2 muscles), 20561 (3+ muscles)- Dry Needling, and Patient/Family education  PLAN FOR NEXT SESSION: assess HEP response, periscapular strengthening, pec stretching  For all possible CPT codes, reference the Planned Interventions line above.     Check all conditions that are expected to impact treatment: {Conditions expected to impact treatment:Musculoskeletal disorders   If treatment provided at initial evaluation, no treatment charged due to lack of authorization.       Alm JAYSON Kingdom PT  02/26/24 7:58 AM

## 2024-02-28 ENCOUNTER — Ambulatory Visit: Admitting: Physical Therapy

## 2024-03-03 ENCOUNTER — Ambulatory Visit (HOSPITAL_COMMUNITY): Admitting: Clinical

## 2024-03-03 DIAGNOSIS — F331 Major depressive disorder, recurrent, moderate: Secondary | ICD-10-CM

## 2024-03-04 ENCOUNTER — Ambulatory Visit

## 2024-03-04 DIAGNOSIS — M6281 Muscle weakness (generalized): Secondary | ICD-10-CM

## 2024-03-04 DIAGNOSIS — R293 Abnormal posture: Secondary | ICD-10-CM | POA: Diagnosis not present

## 2024-03-04 DIAGNOSIS — M25511 Pain in right shoulder: Secondary | ICD-10-CM | POA: Diagnosis not present

## 2024-03-04 NOTE — Therapy (Signed)
 OUTPATIENT PHYSICAL THERAPY SHOULDER TREATMENT   Patient Name: April [name] MRN: [id number] DOB:11/28/1990, 33 y.o., female Today's Date: 03/05/2024  END OF SESSION:  PT End of Session - 03/04/24 1617     Visit Number 4    Number of Visits 17    Date for Recertification  03/26/24    Authorization Type [employer]    Authorization Time Period 01/29/24-03/28/24    Authorization - Visit Number 4    Authorization - Number of Visits 4    PT Start Time 1617    PT Stop Time 1655    PT Time Calculation (min) 38 min    Activity Tolerance Patient tolerated treatment well    Behavior During Therapy WFL for tasks assessed/performed            Past Medical History:  Diagnosis Date   Liver mass    9cm mass MRI after delivery   Medical history non-contributory    Past Surgical History:  Procedure Laterality Date   NO PAST SURGERIES     WISDOM TOOTH EXTRACTION  2013   Patient Active Problem List   Diagnosis Date Noted   Nexplanon  in place 02/14/2022   Contraceptive management 01/27/2022   Vaginal delivery 01/07/2022   Group B streptococcal infection in pregnancy 12/22/2021   Liver mass 08/07/2021   Supervision of normal pregnancy 05/27/2021   Anxiety and depression 01/05/2016   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    PCP: Cleotilde Lukes, DO  REFERRING PROVIDER: Danton Dayle BIRCH, PA   REFERRING DIAG: M25.511 (ICD-10-CM) - Pain in joint of right shoulder   THERAPY DIAG:  Right shoulder pain, unspecified chronicity  Muscle weakness (generalized)  Abnormal posture  Rationale for Evaluation and Treatment: Rehabilitation  ONSET DATE: 3 months  SUBJECTIVE:                                                                                                                                                                                      SUBJECTIVE STATEMENT: Pt presents to PT with decreased current pain but two days ago pain was once again back at a  10/10. Has been compliant with HEP.    EVAL: Pt presents to PT with roughly 3 month hx of R shoulder pain that has been increasing in severity. Denies N/T or referral into R UE, pain is located to R anterior shoulder and feels deep. No trauma or MOI, pain increases with lifting and OH motion, work duties also increase pain. Wants to get back to working out at the gym as well.   Hand dominance: Right  PERTINENT HISTORY: See PMH  PAIN:  Are you having pain?  Yes: NPRS scale: 2/10 Worst: 10/10 Pain location: R anterior shoulder Pain description: deep ache Aggravating factors: lifting, reaching OH Relieving factors: medication,   PRECAUTIONS: None  RED FLAGS: None   WEIGHT BEARING RESTRICTIONS: No  FALLS:  Has patient fallen in last 6 months? No  LIVING ENVIRONMENT: Lives with: lives with their family Lives in: House/apartment  OCCUPATION: [employer]  PLOF: Independent  PATIENT GOALS:decrease pain   NEXT MD VISIT:   OBJECTIVE:  Note: Objective measures were completed at Evaluation unless otherwise noted.  DIAGNOSTIC FINDINGS:  See imaging   PATIENT SURVEYS:  Quick Dash:  QUICK DASH  Please rate your ability do the following activities in the last week by selecting the number below the appropriate response.   Activities Rating  Open a tight or new jar.  1 = No difficulty   Do heavy household chores (e.g., wash walls, floors). 3 = Moderate difficulty  Carry a shopping bag or briefcase 2 = Mild difficulty  Wash your back. 5 = Unable  Use a knife to cut food. 1 = No difficulty   Recreational activities in which you take some force or impact through your arm, shoulder or hand (e.g., golf, hammering, tennis, etc.). 5 = Unable  During the past week, to what extent has your arm, shoulder or hand problem interfered with your normal social activities with family, friends, neighbors or groups?  5 = Extremely  During the past week, were you limited in your work or other  regular daily activities as a result of your arm, shoulder or hand problem? 4 = Very limited  Rate the severity of the following symptoms in the last week: Arm, Shoulder, or hand pain. 5 = Extreme  Rate the severity of the following symptoms in the last week: Tingling (pins and needles) in your arm, shoulder or hand. 2 = Mild  During the past week, how much difficulty have you had sleeping because of the pain in your arm, shoulder or hand?  1 = No difficulty   (A QuickDASH score may not be calculated if there is greater than 1 missing item.)  Quick Dash Disability/Symptom Score: 52.3 - eval  03/04/2024: 27.%  Minimally Clinically Important Difference (MCID): 15-20 points  Flavio, F. et al. (2013). Minimally clinically important difference of the disabilities of the arm, shoulder, and hand outcome measures (DASH) and its shortened version (Quick DASH). Journal of Orthopaedic & Sports Physical Therapy, 44(1), 30-39)   COGNITION: Overall cognitive status: Within functional limits for tasks assessed     SENSATION: WFL  POSTURE: Rounded shoulder   UPPER EXTREMITY ROM:   Active ROM Right eval Left eval Right 03/04/24  Shoulder flexion 160 WNL   Shoulder extension     Shoulder abduction  WNL   Shoulder adduction     Shoulder internal rotation R PSIS WNL L5  Shoulder external rotation WNL WNL   Elbow flexion     Elbow extension     Wrist flexion     Wrist extension     Wrist ulnar deviation     Wrist radial deviation     Wrist pronation     Wrist supination     (Blank rows = not tested)  UPPER EXTREMITY MMT:  MMT Right eval Left eval   Shoulder flexion 3+ 4   Shoulder extension     Shoulder abduction     Shoulder adduction     Shoulder internal rotation 3 4   Shoulder external rotation 3 4  Middle trapezius     Lower trapezius     Elbow flexion     Elbow extension     Wrist flexion     Wrist extension     Wrist ulnar deviation     Wrist radial deviation      Wrist pronation     Wrist supination     Grip strength (lbs)     (Blank rows = not tested)  SHOULDER SPECIAL TESTS: Impingement tests: Hawkins/Kennedy impingement test: negative and Painful arc test: negative SLAP lesions: DNT Instability tests: DNT Rotator cuff assessment: DNT Biceps assessment: DNT  JOINT MOBILITY TESTING:  Normal scapular and R GH movement  PALPATION:  Slight TTP to R AC joint and R pec minor   TREATMENT: [facility] Adult PT Treatment:                                                DATE: 02/19/24 Supine shoulder flexion 2x10 2# Seated horizontal abd 2x10 GTB Seated bilateral ER 2x10 GTB Seated row 3x10 GTB Corner pec stretch 2x30 Body blade IR/ER 2x30 R Body blade flex/ext 2x30 FM row 2x10 17# Shoulder ext 2x10 17# R shoulder ER 2x10 RTB R shoulder flex wall slide 2x10   [facility] Adult PT Treatment:                                                DATE: 02/12/24 Therapeutic Exercise: Seated shoulder ER RTB 10 x 3  Standing Horiz abduct RTB 10 x 2  Standing alt star pattern 3 x 3 pulses  ROW black band 10 x 2  Corner pec stretch x 3 levels  Supine 1# shoulder flexion 10 x 2 , long lever  SL shoulder abdct 1# 10 x 2  Updated HEP    [facility] Adult PT Treatment:                                                DATE: 01/29/2024 Therapeutic Exercise: Corner pec stretch x 30  90/90 pec stretch with towel x 30 Standing row x 10 blue band Seated ER x 10 RTB  PATIENT EDUCATION: Education details: eval findings, Quick DASH, HEP, POC Person educated: Patient Education method: Explanation, Demonstration, and Handouts Education comprehension: verbalized understanding and returned demonstration  HOME EXERCISE PROGRAM: Access Code: [email or web address] URL: [email or web address] Date: 01/29/2024 Prepared by: Alm Kingdom  Exercises - Corner Pec Major Stretch  - 1 x daily - 7 x weekly - 2-3 reps - 30 sec hold - Open Book Chest Rotation Stretch on Foam 1/2  Roll  - 1 x daily - 7 x weekly - 2-3 reps - 30 sec hold - Standing Shoulder Row with Anchored Resistance  - 1 x daily - 7 x weekly - 3 sets - 12 reps - blue band hold - Shoulder External Rotation and Scapular Retraction with Resistance  - 1 x daily - 7 x weekly - 3 sets - 10 reps - red band hold Added  - Standing Shoulder Horizontal Abduction with Resistance  - 1 x daily - 7 x weekly - 2 sets -  10 reps - Alternating star pattern  - 1 x daily - 7 x weekly - 3 sets - 3-5 reps   ASSESSMENT:  CLINICAL IMPRESSION: Pt was able to complete all prescribed exercises with no adverse effect. Today we continued to progress RTC strength for improving R shoulder functional ability. No changes to HEP today. She has been progressing well with therapy, today meeting her LTG for Quick DASH score showing subjective improvement in overall function. She continues to benefit from skilled PT services as her strength and ROM do continue to be below need level. PT will continued to progress as able per POC.  EVAL: Patient is a 33 y.o. F who was seen today for physical therapy evaluation and treatment for subacute R shoulder pain. Physical findings are consistent with with referring provider impression as pt demonstrates decrease in R shoulder strength and ROM into IR. Quick DASH score indicates moderate disability in performance of home ADLs and work/community activities. Pt would benefit from skilled PT services working on improving muscle length and dynamic stabilizer strength in order to decrease pain and improve comfort.    OBJECTIVE IMPAIRMENTS: decreased activity tolerance, decreased endurance, decreased ROM, decreased strength, postural dysfunction, and pain  ACTIVITY LIMITATIONS: carrying, lifting, toileting, dressing, reach over head, and hygiene/grooming  PARTICIPATION LIMITATIONS: cleaning, driving, shopping, community activity, occupation, and yard work  PERSONAL FACTORS: Time since onset of  injury/illness/exacerbation are also affecting patient's functional outcome.   REHAB POTENTIAL: Excellent  CLINICAL DECISION MAKING: Stable/uncomplicated  EVALUATION COMPLEXITY: Low   GOALS: Goals reviewed with patient? No  SHORT TERM GOALS: Target date: 02/20/2024   Pt will be compliant and knowledgeable with initial HEP for improved comfort and carryover Baseline: initial HEP given  Goal status: MET   2.  Pt will self report R shoulder pain no greater than 7/10 for improved comfort and functional ability Baseline: 10/10 at worst 02/12/24: 2/10 Goal status: ONGOING   LONG TERM GOALS: Target date: 03/26/2024   Pt will decrease Quick DASH disability score to no greater than 40% as proxy for functional improvement Baseline: 52.3%  03/05/2024: 23% Goal status: MET  2.  Pt will self report right shoulder pain no greater than 4/10 for improved comfort and functional ability Baseline: 10/10 at worst Goal status: INITIAL   3.  Pt will improve R shoulder IR behind back to at least T10 for improved comfort and functional ability with home ADLs Baseline: R PSIS 03/04/24: L5  Goal status: IN PROGRESS  4.  Pt will improve R shoulder MMT to at least 5/5 for all tested motions for improved comfort and functional ability Baseline: see chart Goal status: IN PROGRESS  PLAN:  PT FREQUENCY: 1-2x/week  PT DURATION: 4 weeks  PLANNED INTERVENTIONS: 97164- PT Re-evaluation, 97110-Therapeutic exercises, 97530- Therapeutic activity, [id number]- Neuromuscular re-education, 97535- Self Care, 02859- Manual therapy, G0283- Electrical stimulation (unattended), [id number]- Electrical stimulation (manual), 97016- Vasopneumatic device, 20560 (1-2 muscles), 20561 (3+ muscles)- Dry Needling, and Patient/Family education  PLAN FOR NEXT SESSION: assess HEP response, periscapular strengthening, pec stretching  For all possible CPT codes, reference the Planned Interventions line above.     Check all conditions  that are expected to impact treatment: {Conditions expected to impact treatment:Musculoskeletal disorders   If treatment provided at initial evaluation, no treatment charged due to lack of authorization.       Alm JAYSON Kingdom PT  03/05/24 7:51 AM

## 2024-03-05 NOTE — Progress Notes (Signed)
 April [name] is a 33 y.o. female patient presented to the scheduled therapy appointment oriented and cooperative. Client denied AVH. Client reported she is doing well with no issues to discuss at this time.client reported she would like to reschedule to a later date. Therapist email the client information to other therapy offices for her son as she previously requested.     Collaboration of Care: Patient refused AEB none requested.  Patient/Guardian was advised Release of Information must be obtained prior to any record release in order to collaborate their care with an outside provider. Patient/Guardian was advised if they have not already done so to contact the registration department to sign all necessary forms in order for us  to release information regarding their care.   Consent: Patient/Guardian gives verbal consent for treatment and assignment of benefits for services provided during this visit. Patient/Guardian expressed understanding and agreed to proceed.    April Kaigler Y Letrell Attwood, LCSW

## 2024-03-11 ENCOUNTER — Ambulatory Visit: Admitting: Allergy & Immunology

## 2024-03-13 ENCOUNTER — Ambulatory Visit: Admitting: Physical Therapy

## 2024-03-18 ENCOUNTER — Ambulatory Visit: Admitting: Physical Therapy

## 2024-03-27 ENCOUNTER — Ambulatory Visit: Admitting: Physical Therapy

## 2024-03-27 ENCOUNTER — Encounter: Payer: Self-pay | Admitting: Physical Therapy

## 2024-03-27 DIAGNOSIS — M25511 Pain in right shoulder: Secondary | ICD-10-CM | POA: Diagnosis present

## 2024-03-27 DIAGNOSIS — R293 Abnormal posture: Secondary | ICD-10-CM | POA: Diagnosis present

## 2024-03-27 DIAGNOSIS — M6281 Muscle weakness (generalized): Secondary | ICD-10-CM | POA: Insufficient documentation

## 2024-03-27 NOTE — Addendum Note (Signed)
 Addended by: BETHEL JONEEN CROME on: 03/27/2024 09:02 AM   Modules accepted: Orders

## 2024-03-27 NOTE — Therapy (Addendum)
 OUTPATIENT PHYSICAL THERAPY SHOULDER TREATMENT   Patient Name: April [name] MRN: [id number] DOB:1990/10/12, 33 y.o., female Today's Date: 03/27/2024  END OF SESSION:  PT End of Session - 03/27/24 0804     Visit Number 5    Number of Visits 17    Date for Recertification  04/10/24    Authorization Type [employer]    Authorization Time Period 01/29/24-03/28/24; 03/13/24-05/12/23    Authorization - Visit Number 1    Authorization - Number of Visits 6    PT Start Time 0802    PT Stop Time 0840    PT Time Calculation (min) 38 min            Past Medical History:  Diagnosis Date   Liver mass    9cm mass MRI after delivery   Medical history non-contributory    Past Surgical History:  Procedure Laterality Date   NO PAST SURGERIES     WISDOM TOOTH EXTRACTION  2013   Patient Active Problem List   Diagnosis Date Noted   Nexplanon  in place 02/14/2022   Contraceptive management 01/27/2022   Vaginal delivery 01/07/2022   Group B streptococcal infection in pregnancy 12/22/2021   Liver mass 08/07/2021   Supervision of normal pregnancy 05/27/2021   Anxiety and depression 01/05/2016   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 Overall better. Have pain with sleeping on right and with reaching across body.    EVAL: Pt presents to PT with roughly 3 month hx of R shoulder pain  that has been increasing in severity. Denies N/T or referral into R UE, pain is located to R anterior shoulder and feels deep. No trauma or MOI, pain increases with lifting and OH motion, work duties also increase pain. Wants to get back to working out at the gym as well.   Hand dominance: Right  PERTINENT HISTORY: See PMH  PAIN:  Are you having pain?  Yes: NPRS scale: 5/10 Worst: 10/10 Pain location: R anterior shoulder Pain description: deep ache Aggravating factors: lifting, reaching OH Relieving  factors: medication,   PRECAUTIONS: None  RED FLAGS: None   WEIGHT BEARING RESTRICTIONS: No  FALLS:  Has patient fallen in last 6 months? No  LIVING ENVIRONMENT: Lives with: lives with their family Lives in: House/apartment  OCCUPATION: [employer]  PLOF: Independent  PATIENT GOALS:decrease pain   NEXT MD VISIT:   OBJECTIVE:  Note: Objective measures were completed at Evaluation unless otherwise noted.  DIAGNOSTIC FINDINGS:  See imaging   PATIENT SURVEYS:  Quick Dash:  QUICK DASH  Please rate your ability do the following activities in the last week by selecting the number below the appropriate response.   Activities Rating  Open a tight or new jar.  1 = No difficulty   Do heavy household chores (e.g., wash walls, floors). 3 = Moderate difficulty  Carry a shopping bag or briefcase 2 = Mild difficulty  Wash your back. 5 = Unable  Use a knife to cut food. 1 = No difficulty   Recreational activities in which you take some force or impact through your arm, shoulder or hand (e.g., golf, hammering, tennis, etc.). 5 = Unable  During the past week, to what extent has your arm, shoulder or hand problem interfered with your normal social activities with family, friends, neighbors or groups?  5 = Extremely  During the past week, were you limited in your work or other regular daily activities as a result of your arm, shoulder or hand problem? 4 = Very limited  Rate the  severity of the following symptoms in the last week: Arm, Shoulder, or hand pain. 5 = Extreme  Rate the severity of the following symptoms in the last week: Tingling (pins and needles) in your arm, shoulder or hand. 2 = Mild  During the past week, how much difficulty have you had sleeping because of the pain in your arm, shoulder or hand?  1 = No difficulty   (A QuickDASH score may not be calculated if there is greater than 1 missing item.)  Quick Dash Disability/Symptom Score: 52.3 - eval  03/04/2024: 27.%  Minimally Clinically Important Difference (MCID): 15-20 points  Flavio, F. et al. (2013). Minimally clinically important difference of the disabilities of the arm, shoulder, and hand outcome measures (DASH) and its shortened version (Quick DASH). Journal of Orthopaedic & Sports Physical Therapy, 44(1), 30-39)   COGNITION: Overall cognitive status: Within functional limits for tasks assessed     SENSATION: WFL  POSTURE: Rounded shoulder   UPPER EXTREMITY ROM:   Active ROM Right eval Left eval Right 03/04/24 Right 03/27/24  Shoulder flexion 160 WNL    Shoulder extension      Shoulder abduction  WNL    Shoulder adduction      Shoulder internal rotation R PSIS WNL L5 T 12  Shoulder external rotation WNL WNL    Elbow flexion      Elbow extension      Wrist flexion      Wrist extension      Wrist ulnar deviation      Wrist radial deviation      Wrist pronation      Wrist supination      (Blank rows = not tested)  UPPER EXTREMITY MMT:  MMT Right eval Left eval Right 03/27/24  Shoulder flexion 3+ 4 4  Shoulder extension     Shoulder abduction     Shoulder adduction     Shoulder internal rotation 3 4 4   Shoulder external rotation 3 4 4   Middle trapezius  Lower trapezius     Elbow flexion     Elbow extension     Wrist flexion     Wrist extension     Wrist ulnar deviation     Wrist radial deviation     Wrist pronation     Wrist supination     Grip  strength (lbs)     (Blank rows = not tested)  SHOULDER SPECIAL TESTS: Impingement tests: Hawkins/Kennedy impingement test: negative and Painful arc test: negative SLAP lesions: DNT Instability tests: DNT Rotator cuff assessment: DNT Biceps assessment: DNT  JOINT MOBILITY TESTING:  Normal scapular and R GH movement  PALPATION:  Slight TTP to R AC joint and R pec minor   TREATMENT: [facility] Adult PT Treatment:                                                DATE: 03/27/24 Therapeutic Exercise: Review of HEP - increased to Blue band resistance  ADDED IR stretch with towel and posterior capsule stretch    [facility] Adult PT Treatment:                                                DATE: 02/19/24 Supine shoulder flexion 2x10 2# Seated horizontal abd 2x10 GTB Seated bilateral ER 2x10 GTB Seated row 3x10 GTB Corner pec stretch 2x30 Body blade IR/ER 2x30 R Body blade flex/ext 2x30 FM row 2x10 17# Shoulder ext 2x10 17# R shoulder ER 2x10 RTB R shoulder flex wall slide 2x10   [facility] Adult PT Treatment:                                                DATE: 02/19/24 Supine shoulder flexion 2x10 2# Seated horizontal abd 2x10 GTB Seated bilateral ER 2x10 GTB Seated row 3x10 GTB Corner pec stretch 2x30 Body blade IR/ER 2x30 R Body blade flex/ext 2x30 FM row 2x10 17# Shoulder ext 2x10 17# R shoulder ER 2x10 RTB R shoulder flex wall slide 2x10   [facility] Adult PT Treatment:                                                DATE: 02/12/24 Therapeutic Exercise: Seated shoulder ER RTB 10 x 3  Standing Horiz abduct RTB 10 x 2  Standing alt star pattern 3 x 3 pulses  ROW black band 10 x 2  Corner pec stretch x 3 levels  Supine 1# shoulder flexion 10 x 2 , long lever  SL shoulder abdct 1# 10 x 2  Updated HEP    [facility] Adult PT Treatment:                                                DATE: 01/29/2024 Therapeutic Exercise: Corner pec stretch x 30  90/90 pec stretch with towel x 30 Standing  row x 10 blue band Seated ER x 10 RTB  PATIENT EDUCATION: Education details: eval findings, Quick DASH, HEP, POC Person educated: Patient Education method: Explanation, Demonstration, and Handouts Education comprehension: verbalized understanding and returned demonstration  HOME EXERCISE PROGRAM: Access Code: [email or web address] URL: [email or web address] Date: 01/29/2024 Prepared by: Alm Kingdom  Exercises - Corner Pec Major Stretch  - 1 x daily - 7 x weekly - 2-3 reps - 30 sec hold - Open Book Chest Rotation Stretch on Foam 1/2 Roll  - 1 x daily - 7 x weekly - 2-3 reps - 30 sec hold - Standing Shoulder Row with Anchored Resistance  - 1 x daily - 7 x weekly - 3 sets - 12 reps - blue band hold - Shoulder External Rotation and Scapular Retraction with Resistance  - 1 x daily - 7 x weekly - 3 sets - 10 reps - red band hold Added  - Standing Shoulder Horizontal Abduction with Resistance  - 1 x daily - 7 x weekly - 2 sets - 10 reps - Alternating star pattern  - 1 x daily - 7 x weekly - 3 sets - 3-5 reps Added 03/27/24 - Standing Shoulder Internal Rotation Stretch with Towel  - 1 x daily - 7 x weekly - 1 sets - 3 reps - 30 hold - Standing Shoulder Posterior Capsule Stretch  - 1 x daily - 7 x weekly - 1 sets - 3 reps - 30 hold   ASSESSMENT:  CLINICAL IMPRESSION: Pt was able to complete all prescribed exercises with no adverse effect. Today we continued to progress RTC strength for improving R shoulder functional ability. HEP updated with shoulder stretches and increased resistance band tension.  She has been progressing well toward LTGS however continues to have pain with cross body reaching and with sleeping on right side. She continues to benefit from skilled PT services as her strength and ROM do continue to be below need level. PT will continued to progress as able per POC and extend 2 more weeks to finalize HEP prior to her breast reduction surgery.   EVAL: Patient is a 33 y.o. F  who was seen today for physical therapy evaluation and treatment for subacute R shoulder pain. Physical findings are consistent with with referring provider impression as pt demonstrates decrease in R shoulder strength and ROM into IR. Quick DASH score indicates moderate disability in performance of home ADLs and work/community activities. Pt would benefit from skilled PT services working on improving muscle length and dynamic stabilizer strength in order to decrease pain and improve comfort.    OBJECTIVE IMPAIRMENTS: decreased activity tolerance, decreased endurance, decreased ROM, decreased strength, postural dysfunction, and pain  ACTIVITY LIMITATIONS: carrying, lifting, toileting, dressing, reach over head, and hygiene/grooming  PARTICIPATION LIMITATIONS: cleaning, driving, shopping, community activity, occupation, and yard work  PERSONAL FACTORS: Time since onset of injury/illness/exacerbation are also affecting patient's functional outcome.   REHAB POTENTIAL: Excellent  CLINICAL DECISION MAKING: Stable/uncomplicated  EVALUATION COMPLEXITY: Low   GOALS: Goals reviewed with patient? No  SHORT TERM GOALS: Target date: 02/20/2024   Pt will be compliant and knowledgeable with initial HEP for improved comfort and carryover Baseline: initial HEP given  Goal status: MET   2.  Pt will self report R shoulder pain no greater than 7/10 for improved comfort and functional ability Baseline: 10/10 at worst 02/12/24: 2/10 03/27/24: 5/10 worst Goal status: MET  LONG TERM GOALS: Target date: 04/10/2024   Pt will decrease Quick DASH disability score to  no greater than 40% as proxy for functional improvement Baseline: 52.3%  03/05/2024: 23% Goal status: MET  2.  Pt will self report right shoulder pain no greater than 4/10 for improved comfort and functional ability Baseline: 10/10 at worst 03/27/24: 5/10 Goal status: ONGOING   3.  Pt will improve R shoulder IR behind back to at least T10  for improved comfort and functional ability with home ADLs Baseline: R PSIS 03/04/24: L5  03/27/24: T 12  Goal status: IN PROGRESS  4.  Pt will improve R shoulder MMT to at least 5/5 for all tested motions for improved comfort and functional ability Baseline: see chart 03/27/24: 4/5 Goal status: IN PROGRESS  PLAN:  PT FREQUENCY: 1-2x/week  PT DURATION: 4 weeks  PLANNED INTERVENTIONS: 97164- PT Re-evaluation, 97110-Therapeutic exercises, 97530- Therapeutic activity, 97112- Neuromuscular re-education, 97535- Self Care, 02859- Manual therapy, G0283- Electrical stimulation (unattended), [id number]- Electrical stimulation (manual), 97016- Vasopneumatic device, 79439 (1-2 muscles), 20561 (3+ muscles)- Dry Needling, and Patient/Family education  PLAN FOR NEXT SESSION: assess HEP response, periscapular strengthening, pec stretching  For all possible CPT codes, reference the Planned Interventions line above.     Check all conditions that are expected to impact treatment: {Conditions expected to impact treatment:Musculoskeletal disorders   If treatment provided at initial evaluation, no treatment charged due to lack of authorization.       Harlene Persons, PTA 03/27/2024 8:38 AM Phone: [phone] Fax: [phone]   Joneen Fresh PT, DPT, LAT, ATC  03/27/2024  9:00 AM

## 2024-03-28 ENCOUNTER — Encounter (HOSPITAL_BASED_OUTPATIENT_CLINIC_OR_DEPARTMENT_OTHER): Payer: Self-pay | Admitting: Plastic Surgery

## 2024-03-28 ENCOUNTER — Other Ambulatory Visit: Payer: Self-pay

## 2024-04-02 DIAGNOSIS — M549 Dorsalgia, unspecified: Secondary | ICD-10-CM | POA: Diagnosis not present

## 2024-04-02 DIAGNOSIS — M542 Cervicalgia: Secondary | ICD-10-CM | POA: Diagnosis not present

## 2024-04-02 DIAGNOSIS — N62 Hypertrophy of breast: Secondary | ICD-10-CM | POA: Diagnosis not present

## 2024-04-02 DIAGNOSIS — L304 Erythema intertrigo: Secondary | ICD-10-CM | POA: Diagnosis not present

## 2024-04-02 DIAGNOSIS — Z01818 Encounter for other preprocedural examination: Secondary | ICD-10-CM | POA: Diagnosis not present

## 2024-04-02 NOTE — H&P (Signed)
 Subjective Patient ID: April [name] is a 33 y.o. female.     HPI   Returns for follow up discussion prior to planned breast reduction. Current 42 DD or DDD. Reports several year history neck back shoulder pain with associated recurrent excoriations beneath breasts. She has tried specialty fitted bras, NSAIDs, PT course, hygiene measures for over 3 month trial without change.   Wt stable over last year   No prior MMG. Denies FH breast or ovarian ca.   PMH includes history liver mass, seen by Dr. Dasie in past and conseled no treatment necessary for nodular hyperplasia.   Works as training and development officer at [employer]. Lives with two kids ages 2 and 64. Fiance and mother in area to assist with post op care.   Review of Systems  Musculoskeletal:  Positive for back pain and neck pain.  Allergic/Immunologic: Positive for food allergies.  All other systems reviewed and are negative.    Objective Physical Exam  Cardiovascular: Normal rate, regular rhythm and normal heart sounds.    Pulmonary/Chest Effort normal and breath sounds normal.    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   Chronic neck and back pain, intertrigo, in setting of macromastia that has failed conservative management over 3 month trial. The pain is not related to other diagnoses. The pain and rashes interfere with activities of daily living. There is a reasonable likelihood that the patient's symptoms are primarily due to macromastia. Breast reduction is likely to result in improvement of the chronic pain.  Reviewed reduction with anchor type scars, OP surgery, drains, post operative visits and  limitations, recovery. Diminished sensation nipple and breast skin, risk of nipple loss, wound healing problems, asymmetry, incidental carcinoma, changes with wt gain/loss, aging, unacceptable cosmetic appearance reviewed. Reviewed changes with pregnancy, effect on ability to breast feed. Reviewed scar maturation over months. Reviewed cannot assure cup size. Discussed any complication with NAC would preclude her ability to breast feed in future.    Additional risks including but not limited to bleeding, hematoma, seroma, infection, wound healing problems, need for additional surgery, damage to adjacent structures, blood clots in legs or lung reviewed. Completed ASPS consent for breast reduction.    Drain teaching completed. Rx for tramadol given.  Anticipate 597 g reduction from each breast.   Earlis Ranks, MD [facility]  Office/ physician access line after hours [phone]

## 2024-04-08 ENCOUNTER — Ambulatory Visit (HOSPITAL_BASED_OUTPATIENT_CLINIC_OR_DEPARTMENT_OTHER): Payer: Self-pay | Admitting: Anesthesiology

## 2024-04-08 ENCOUNTER — Other Ambulatory Visit: Payer: Self-pay

## 2024-04-08 ENCOUNTER — Encounter (HOSPITAL_BASED_OUTPATIENT_CLINIC_OR_DEPARTMENT_OTHER): Payer: Self-pay | Admitting: Plastic Surgery

## 2024-04-08 ENCOUNTER — Ambulatory Visit (HOSPITAL_BASED_OUTPATIENT_CLINIC_OR_DEPARTMENT_OTHER)
Admission: RE | Admit: 2024-04-08 | Discharge: 2024-04-08 | Disposition: A | Discharge status: Home / Self Care | Attending: Plastic Surgery | Admitting: Plastic Surgery

## 2024-04-08 ENCOUNTER — Encounter (HOSPITAL_BASED_OUTPATIENT_CLINIC_OR_DEPARTMENT_OTHER)
Admission: RE | Disposition: A | Discharge status: Home / Self Care | Payer: Self-pay | Source: Home / Self Care | Attending: Plastic Surgery

## 2024-04-08 DIAGNOSIS — N62 Hypertrophy of breast: Secondary | ICD-10-CM | POA: Insufficient documentation

## 2024-04-08 DIAGNOSIS — M542 Cervicalgia: Secondary | ICD-10-CM | POA: Diagnosis present

## 2024-04-08 DIAGNOSIS — L304 Erythema intertrigo: Secondary | ICD-10-CM | POA: Diagnosis not present

## 2024-04-08 DIAGNOSIS — M5489 Other dorsalgia: Secondary | ICD-10-CM | POA: Insufficient documentation

## 2024-04-08 HISTORY — PX: BREAST REDUCTION SURGERY: SHX8

## 2024-04-08 LAB — POCT PREGNANCY, URINE: Preg Test, Ur: NEGATIVE

## 2024-04-08 SURGERY — MAMMOPLASTY, REDUCTION
Anesthesia: General | Site: Breast | Laterality: Bilateral

## 2024-04-08 MED ORDER — CEFAZOLIN SODIUM-DEXTROSE 2-4 GM/100ML-% IV SOLN
INTRAVENOUS | Status: AC
  Filled 2024-04-08: qty 100

## 2024-04-08 MED ORDER — FENTANYL CITRATE (PF) 100 MCG/2ML IJ SOLN
INTRAMUSCULAR | Status: AC
  Filled 2024-04-08: qty 2

## 2024-04-08 MED ORDER — KETAMINE HCL 10 MG/ML IJ SOLN
INTRAMUSCULAR | Status: DC | PRN
  Administered 2024-04-08: 10 mg via INTRAVENOUS
  Administered 2024-04-08 (×2): 20 mg via INTRAVENOUS

## 2024-04-08 MED ORDER — HYDROMORPHONE HCL 1 MG/ML IJ SOLN
INTRAMUSCULAR | Status: AC
  Filled 2024-04-08: qty 0.5

## 2024-04-08 MED ORDER — CHLORHEXIDINE GLUCONATE CLOTH 2 % EX PADS: 6.0000 | MEDICATED_PAD | Freq: Once | CUTANEOUS | Status: DC

## 2024-04-08 MED ORDER — MIDAZOLAM HCL 5 MG/5ML IJ SOLN
INTRAMUSCULAR | Status: DC | PRN
  Administered 2024-04-08: 2 mg via INTRAVENOUS

## 2024-04-08 MED ORDER — FENTANYL CITRATE (PF) 100 MCG/2ML IJ SOLN: 25.0000 ug | INTRAMUSCULAR | Status: DC | PRN

## 2024-04-08 MED ORDER — ACETAMINOPHEN 500 MG PO TABS
1000.0000 mg | ORAL_TABLET | ORAL | Status: AC
  Administered 2024-04-08: 1000 mg via ORAL

## 2024-04-08 MED ORDER — SCOPOLAMINE 1 MG/3DAYS TD PT72
1.0000 | MEDICATED_PATCH | TRANSDERMAL | Status: DC
  Administered 2024-04-08: 1 mg via TRANSDERMAL

## 2024-04-08 MED ORDER — ROCURONIUM BROMIDE 10 MG/ML (PF) SYRINGE
PREFILLED_SYRINGE | INTRAVENOUS | Status: AC
  Filled 2024-04-08: qty 10

## 2024-04-08 MED ORDER — CELECOXIB 200 MG PO CAPS
ORAL_CAPSULE | ORAL | Status: AC
  Filled 2024-04-08: qty 1

## 2024-04-08 MED ORDER — ONDANSETRON HCL 4 MG/2ML IJ SOLN: 4.0000 mg | Freq: Once | INTRAMUSCULAR | Status: DC | PRN

## 2024-04-08 MED ORDER — KETAMINE HCL 50 MG/5ML IJ SOSY
PREFILLED_SYRINGE | INTRAMUSCULAR | Status: AC
  Filled 2024-04-08: qty 5

## 2024-04-08 MED ORDER — ONDANSETRON HCL 4 MG/2ML IJ SOLN
INTRAMUSCULAR | Status: DC | PRN
  Administered 2024-04-08: 4 mg via INTRAVENOUS

## 2024-04-08 MED ORDER — ACETAMINOPHEN 10 MG/ML IV SOLN: 1000.0000 mg | Freq: Once | INTRAVENOUS | Status: DC | PRN

## 2024-04-08 MED ORDER — SUGAMMADEX SODIUM 200 MG/2ML IV SOLN
INTRAVENOUS | Status: DC | PRN
  Administered 2024-04-08: 200 mg via INTRAVENOUS

## 2024-04-08 MED ORDER — FENTANYL CITRATE (PF) 100 MCG/2ML IJ SOLN
INTRAMUSCULAR | Status: DC | PRN
  Administered 2024-04-08 (×2): 50 ug via INTRAVENOUS
  Administered 2024-04-08: 100 ug via INTRAVENOUS

## 2024-04-08 MED ORDER — PROPOFOL 10 MG/ML IV BOLUS
INTRAVENOUS | Status: AC
  Filled 2024-04-08: qty 20

## 2024-04-08 MED ORDER — ONDANSETRON HCL 4 MG/2ML IJ SOLN
INTRAMUSCULAR | Status: AC
  Filled 2024-04-08: qty 2

## 2024-04-08 MED ORDER — DEXAMETHASONE SOD PHOSPHATE PF 10 MG/ML IJ SOLN
INTRAMUSCULAR | Status: DC | PRN
  Administered 2024-04-08: 10 mg via INTRAVENOUS

## 2024-04-08 MED ORDER — CELECOXIB 200 MG PO CAPS
200.0000 mg | ORAL_CAPSULE | ORAL | Status: AC
  Administered 2024-04-08: 200 mg via ORAL

## 2024-04-08 MED ORDER — LIDOCAINE HCL (CARDIAC) PF 100 MG/5ML IV SOSY
PREFILLED_SYRINGE | INTRAVENOUS | Status: DC | PRN
  Administered 2024-04-08: 100 mg via INTRAVENOUS

## 2024-04-08 MED ORDER — MIDAZOLAM HCL 2 MG/2ML IJ SOLN
INTRAMUSCULAR | Status: AC
  Filled 2024-04-08: qty 2

## 2024-04-08 MED ORDER — OXYCODONE HCL 5 MG/5ML PO SOLN: 5.0000 mg | Freq: Once | ORAL | Status: DC | PRN

## 2024-04-08 MED ORDER — LACTATED RINGERS IV SOLN: INTRAVENOUS | Status: DC

## 2024-04-08 MED ORDER — ROCURONIUM BROMIDE 100 MG/10ML IV SOLN
INTRAVENOUS | Status: DC | PRN
  Administered 2024-04-08: 70 mg via INTRAVENOUS

## 2024-04-08 MED ORDER — GABAPENTIN 300 MG PO CAPS
300.0000 mg | ORAL_CAPSULE | ORAL | Status: AC
  Administered 2024-04-08: 300 mg via ORAL

## 2024-04-08 MED ORDER — ACETAMINOPHEN 500 MG PO TABS
ORAL_TABLET | ORAL | Status: AC
  Filled 2024-04-08: qty 2

## 2024-04-08 MED ORDER — HYDROMORPHONE HCL 1 MG/ML IJ SOLN
INTRAMUSCULAR | Status: DC | PRN
  Administered 2024-04-08 (×2): .5 mg via INTRAVENOUS

## 2024-04-08 MED ORDER — BUPIVACAINE HCL (PF) 0.5 % IJ SOLN
INTRAMUSCULAR | Status: DC | PRN
  Administered 2024-04-08: 30 mL

## 2024-04-08 MED ORDER — GABAPENTIN 300 MG PO CAPS
ORAL_CAPSULE | ORAL | Status: AC
  Filled 2024-04-08: qty 1

## 2024-04-08 MED ORDER — 0.9 % SODIUM CHLORIDE (POUR BTL) OPTIME
TOPICAL | Status: DC | PRN
  Administered 2024-04-08: 1000 mL

## 2024-04-08 MED ORDER — AMISULPRIDE (ANTIEMETIC) 5 MG/2ML IV SOLN: 10.0000 mg | Freq: Once | INTRAVENOUS | Status: DC | PRN

## 2024-04-08 MED ORDER — SCOPOLAMINE 1 MG/3DAYS TD PT72
MEDICATED_PATCH | TRANSDERMAL | Status: AC
  Filled 2024-04-08: qty 1

## 2024-04-08 MED ORDER — PROPOFOL 10 MG/ML IV BOLUS
INTRAVENOUS | Status: DC | PRN
  Administered 2024-04-08: 50 mg via INTRAVENOUS
  Administered 2024-04-08: 100 mg via INTRAVENOUS
  Administered 2024-04-08: 50 mg via INTRAVENOUS

## 2024-04-08 MED ORDER — LIDOCAINE 2% (20 MG/ML) 5 ML SYRINGE
INTRAMUSCULAR | Status: AC
  Filled 2024-04-08: qty 5

## 2024-04-08 MED ORDER — PROPOFOL 500 MG/50ML IV EMUL
INTRAVENOUS | Status: DC | PRN
  Administered 2024-04-08: 50 ug/kg/min via INTRAVENOUS

## 2024-04-08 MED ORDER — OXYCODONE HCL 5 MG PO TABS: 5.0000 mg | ORAL_TABLET | Freq: Once | ORAL | Status: DC | PRN

## 2024-04-08 MED ORDER — CEFAZOLIN SODIUM-DEXTROSE 2-4 GM/100ML-% IV SOLN
2.0000 g | INTRAVENOUS | Status: AC
  Administered 2024-04-08: 2 g via INTRAVENOUS

## 2024-04-08 SURGICAL SUPPLY — 44 items
BINDER BREAST 3XL (GAUZE/BANDAGES/DRESSINGS) IMPLANT
BINDER BREAST LRG (GAUZE/BANDAGES/DRESSINGS) IMPLANT
BINDER BREAST MEDIUM (GAUZE/BANDAGES/DRESSINGS) IMPLANT
BINDER BREAST XLRG (GAUZE/BANDAGES/DRESSINGS) IMPLANT
BINDER BREAST XXLRG (GAUZE/BANDAGES/DRESSINGS) IMPLANT
BLADE SURG 10 STRL SS (BLADE) ×4 IMPLANT
BNDG GAUZE DERMACEA FLUFF 4 (GAUZE/BANDAGES/DRESSINGS) ×2 IMPLANT
CANISTER SUCT 1200ML W/VALVE (MISCELLANEOUS) ×1 IMPLANT
CHLORAPREP W/TINT 26 (MISCELLANEOUS) ×2 IMPLANT
COVER BACK TABLE 60X90IN (DRAPES) ×1 IMPLANT
COVER MAYO STAND STRL (DRAPES) ×1 IMPLANT
DERMABOND ADVANCED .7 DNX12 (GAUZE/BANDAGES/DRESSINGS) ×2 IMPLANT
DRAIN CHANNEL 15F RND FF W/TCR (WOUND CARE) IMPLANT
DRAIN CHANNEL 19F RND (DRAIN) IMPLANT
DRAPE TOP ARMCOVERS (MISCELLANEOUS) ×1 IMPLANT
DRAPE U-SHAPE 76X120 STRL (DRAPES) ×1 IMPLANT
DRAPE UTILITY XL STRL (DRAPES) ×1 IMPLANT
ELECT COATED BLADE 2.86 ST (ELECTRODE) ×1 IMPLANT
ELECTRODE REM PT RTRN 9FT ADLT (ELECTROSURGICAL) ×1 IMPLANT
EVACUATOR SILICONE 100CC (DRAIN) IMPLANT
GAUZE PAD ABD 8X10 STRL (GAUZE/BANDAGES/DRESSINGS) ×2 IMPLANT
GLOVE BIO SURGEON STRL SZ 6 (GLOVE) ×2 IMPLANT
GOWN STRL REUS W/ TWL LRG LVL3 (GOWN DISPOSABLE) ×2 IMPLANT
MARKER SKIN DUAL TIP RULER LAB (MISCELLANEOUS) IMPLANT
NEEDLE HYPO 25X1 1.5 SAFETY (NEEDLE) ×1 IMPLANT
PACK BASIN DAY SURGERY FS (CUSTOM PROCEDURE TRAY) ×1 IMPLANT
PENCIL BUTTON HOLSTER BLD 10FT (ELECTRODE) IMPLANT
PENCIL SMOKE EVACUATOR (MISCELLANEOUS) ×1 IMPLANT
PIN SAFETY STERILE (MISCELLANEOUS) ×1 IMPLANT
SHEET MEDIUM DRAPE 40X70 STRL (DRAPES) ×1 IMPLANT
SLEEVE SCD COMPRESS KNEE MED (STOCKING) ×1 IMPLANT
SOLN 0.9% NACL POUR BTL 1000ML (IV SOLUTION) ×1 IMPLANT
SPONGE T-LAP 18X18 ~~LOC~~+RFID (SPONGE) ×3 IMPLANT
STAPLER SKIN PROX WIDE 3.9 (STAPLE) ×1 IMPLANT
SUT ETHILON 2 0 FS 18 (SUTURE) IMPLANT
SUT MNCRL AB 3-0 PS2 18 (SUTURE) IMPLANT
SUT MNCRL AB 4-0 PS2 18 (SUTURE) IMPLANT
SUT VIC AB 3-0 PS1 18XBRD (SUTURE) IMPLANT
SYR BULB IRRIG 60ML STRL (SYRINGE) ×1 IMPLANT
SYR CONTROL 10ML LL (SYRINGE) ×1 IMPLANT
TOWEL GREEN STERILE FF (TOWEL DISPOSABLE) ×2 IMPLANT
TUBE CONNECTING 20X1/4 (TUBING) ×1 IMPLANT
UNDERPAD 30X36 HEAVY ABSORB (UNDERPADS AND DIAPERS) ×2 IMPLANT
YANKAUER SUCT BULB TIP NO VENT (SUCTIONS) ×1 IMPLANT

## 2024-04-08 NOTE — Anesthesia Postprocedure Evaluation (Signed)
"   Anesthesia Post Note  Patient: April [name]  Procedure(s) Performed: MAMMOPLASTY, REDUCTION (Bilateral: Breast)     Patient location during evaluation: PACU Anesthesia Type: General Level of consciousness: awake and alert Pain management: pain level controlled Vital Signs Assessment: post-procedure vital signs reviewed and stable Respiratory status: spontaneous breathing, nonlabored ventilation and respiratory function stable Cardiovascular status: blood pressure returned to baseline and stable Postop Assessment: no apparent nausea or vomiting Anesthetic complications: no   No notable events documented.  Last Vitals:  Vitals:   04/08/24 1530 04/08/24 1545  BP: 118/69 127/76  Pulse: 86 85  Resp: 19 12  Temp:    SpO2: 98% 100%    Last Pain:  Vitals:   04/08/24 1545  TempSrc:   PainSc: 3                  Azael Ragain,W. EDMOND      "

## 2024-04-08 NOTE — Interval H&P Note (Signed)
 History and Physical Interval Note:  04/08/2024 10:58 AM  April [name]  has presented today for surgery, with the diagnosis of macromastia chronic neck and back pain intertrigo.  The various methods of treatment have been discussed with the patient and family. After consideration of risks, benefits and other options for treatment, the patient has consented to  Procedures: MAMMOPLASTY, REDUCTION (Bilateral) as a surgical intervention.  The patient's history has been reviewed, patient examined, no change in status, stable for surgery.  I have reviewed the patient's chart and labs.  Questions were answered to the patient's satisfaction.     [name]

## 2024-04-08 NOTE — Transfer of Care (Signed)
 Immediate Anesthesia Transfer of Care Note  Patient: April [name]  Procedure(s) Performed: MAMMOPLASTY, REDUCTION (Bilateral: Breast)  Patient Location: PACU  Anesthesia Type:General  Level of Consciousness: drowsy, patient cooperative, and responds to stimulation  Airway & Oxygen Therapy: Patient Spontanous Breathing and Patient connected to face mask oxygen  Post-op Assessment: Report given to RN and Post -op Vital signs reviewed and stable  Post vital signs: Reviewed and stable  Last Vitals:  Vitals Value Taken Time  BP    Temp    Pulse    Resp    SpO2      Last Pain:  Vitals:   04/08/24 1015  TempSrc: Temporal  PainSc: 0-No pain      Patients Stated Pain Goal: 3 (04/08/24 1015)  Complications: No notable events documented.

## 2024-04-08 NOTE — Op Note (Signed)
 Operative Note   DATE OF OPERATION: 12.30.2025  LOCATION: [facility]-outpatient  SURGICAL DIVISION: Plastic Surgery  PREOPERATIVE DIAGNOSES:  1. Macromastia 2. Chronic neck and back pain 3. Intertrigo  POSTOPERATIVE DIAGNOSES:  same  PROCEDURE:  Bilateral breast reduction  SURGEON: Earlis Ranks MD MBA  ASSISTANTBETHA VEAR Seats RNFA  ANESTHESIA:  General.   EBL: 150 ml  COMPLICATIONS: None immediate.   INDICATIONS FOR PROCEDURE:  The patient, April [name], is a 33 y.o. female born on November 24, 1990, is here for treatment chronic neck and back pain, intertrigo, in setting of macromastia that has failed conservative measures.   FINDINGS: Right reduction 931 g Left reduction 1123 g  DESCRIPTION OF PROCEDURE:  The patient was marked standing in the preoperative area to mark sternal notch, chest midline, anterior axillary lines, inframammary folds. The location of new nipple areolar complex was marked at level of on inframammary fold on anterior surface breast by palpation. This was marked symmetric over bilateral breasts. With aid of Wise pattern marker, location of new nipple areolar complex and vertical limbs (7.5 cm) were marked by displacement of breasts along meridian. The patient was taken to the operating room. SCDs were placed and IV antibiotics were given. The patient's operative site was prepped and draped in a sterile fashion. A time out was performed and all information was confirmed to be correct.     I began on left breast. Over left breast, superior medial pedicle marked and nipple areolar complex incised with 45 diameter marker. Pedicle deepithlialized and developed to chest wall. Pedicle developed to chest wall. Breast tissue resected over lower pole. Medial and lateral flaps developed. Additional superior and lateral breast tissue excised. Breast tailor tacked closed.   I then directed attention to right breast where superior medial pedicle designed. NAC incised  with 45 diameter marker. The pedicle was deepithelialized. Pedicle developed to chest wall. Breast tissue resected over lower pole. Medial and lateral flaps developed. Additional superior and lateral breast tissue excised. Breast tailor tacked closed. Patient assessed for symmetry. Patient returned to supine position. Breast cavities irrigated and hemostasis obtained. Local anesthetic infiltrated throughout each breast. 15 Fr JP placed in each breast and secured with 2-0 nylon. Closure completed bilateral with interrupted and short running 3-0 vicryl used to approximate dermis along remainder inframammary fold and vertical limb. NAC inset with 3-0 vicryl in dermis. Skin closure completed with 4-0 monocryl subcuticular throughout vertical limbs and NAC. Skin closure completed with 3-0 monocryl along each IMF. Tissue adhesive applied. Dry dressing and breast binder applied.  The patient was allowed to wake from anesthesia, extubated and taken to the recovery room in satisfactory condition.   SPECIMENS: right and left breast reduction  DRAINS: 15 Fr JP in right and left breast  Earlis Ranks, MD [facility]  Office/ physician access line after hours [phone]

## 2024-04-08 NOTE — Discharge Instructions (Signed)
 No Tylenol  before 4:20pm. No ibuprofen  before 6:20pm.   Post Anesthesia Home Care Instructions  Activity: Get plenty of rest for the remainder of the day. A responsible individual must stay with you for 24 hours following the procedure.  For the next 24 hours, DO NOT: -Drive a car -Advertising copywriter -Drink alcoholic beverages -Take any medication unless instructed by your physician -Make any legal decisions or sign important papers.  Meals: Start with liquid foods such as gelatin or soup. Progress to regular foods as tolerated. Avoid greasy, spicy, heavy foods. If nausea and/or vomiting occur, drink only clear liquids until the nausea and/or vomiting subsides. Call your physician if vomiting continues.  Special Instructions/Symptoms: Your throat may feel dry or sore from the anesthesia or the breathing tube placed in your throat during surgery. If this causes discomfort, gargle with warm salt water. The discomfort should disappear within 24 hours.  If you had a scopolamine  patch placed behind your ear for the management of post- operative nausea and/or vomiting:  1. The medication in the patch is effective for 72 hours, after which it should be removed.  Wrap patch in a tissue and discard in the trash. Wash hands thoroughly with soap and water. 2. You may remove the patch earlier than 72 hours if you experience unpleasant side effects which may include dry mouth, dizziness or visual disturbances. 3. Avoid touching the patch. Wash your hands with soap and water after contact with the patch.

## 2024-04-08 NOTE — Anesthesia Procedure Notes (Signed)
 Procedure Name: Intubation Date/Time: 04/08/2024 11:45 AM  Performed by: Frost Kayla MATSU, CRNAPre-anesthesia Checklist: Patient identified, Emergency Drugs available, Suction available and Patient being monitored Patient Re-evaluated:Patient Re-evaluated prior to induction Oxygen Delivery Method: Circle system utilized Preoxygenation: Pre-oxygenation with 100% oxygen Induction Type: IV induction Ventilation: Mask ventilation without difficulty Laryngoscope Size: Miller and 3 Grade View: Grade I Tube type: Oral Tube size: 7.0 mm Number of attempts: 1 Airway Equipment and Method: Stylet and Oral airway Placement Confirmation: ETT inserted through vocal cords under direct vision, positive ETCO2 and breath sounds checked- equal and bilateral Secured at: 20 cm Tube secured with: Tape Dental Injury: Teeth and Oropharynx as per pre-operative assessment

## 2024-04-08 NOTE — Anesthesia Preprocedure Evaluation (Addendum)
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, 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)    Endo/Other  neg diabetes    Renal/GU Renal disease     Musculoskeletal   Abdominal   Peds  Hematology   Anesthesia Other Findings   Reproductive/Obstetrics                              Anesthesia Physical Anesthesia Plan  ASA: 2  Anesthesia Plan: General   Post-op Pain Management:    Induction: Intravenous  PONV Risk Score and Plan: 3 and Dexamethasone , Ondansetron , Treatment may vary due to age or medical condition, Midazolam , Scopolamine  patch - Pre-op and Propofol  infusion  Airway Management Planned: Oral ETT  Additional Equipment: None  Intra-op Plan:   Post-operative Plan: Extubation in OR  Informed Consent: I have reviewed the patients History and Physical, chart, labs and discussed the procedure including the risks, benefits and alternatives for the proposed anesthesia with the patient or authorized representative who has indicated his/her understanding and acceptance.     Dental advisory given  Plan Discussed with: CRNA  Anesthesia Plan Comments:          Anesthesia Quick Evaluation  "

## 2024-04-09 ENCOUNTER — Encounter (HOSPITAL_BASED_OUTPATIENT_CLINIC_OR_DEPARTMENT_OTHER): Payer: Self-pay | Admitting: Plastic Surgery

## 2024-04-11 LAB — SURGICAL PATHOLOGY
